# Patient Record
Sex: Female | Born: 1955 | Race: White | Hispanic: No | Smoking: Former smoker
Health system: Southern US, Community
[De-identification: ages and names within clinical notes are randomized; demographics above are authoritative.]

## PROBLEM LIST (undated history)

## (undated) DIAGNOSIS — F419 Anxiety disorder, unspecified: Secondary | ICD-10-CM

## (undated) DIAGNOSIS — O009 Unspecified ectopic pregnancy without intrauterine pregnancy: Secondary | ICD-10-CM

## (undated) DIAGNOSIS — R0600 Dyspnea, unspecified: Secondary | ICD-10-CM

## (undated) DIAGNOSIS — T7840XA Allergy, unspecified, initial encounter: Secondary | ICD-10-CM

## (undated) DIAGNOSIS — K219 Gastro-esophageal reflux disease without esophagitis: Secondary | ICD-10-CM

## (undated) DIAGNOSIS — J449 Chronic obstructive pulmonary disease, unspecified: Secondary | ICD-10-CM

## (undated) HISTORY — DX: Allergy, unspecified, initial encounter: T78.40XA

## (undated) HISTORY — PX: KIDNEY DONATION: SHX685

## (undated) HISTORY — DX: Chronic obstructive pulmonary disease, unspecified: J44.9

## (undated) HISTORY — PX: EYE SURGERY: SHX253

## (undated) HISTORY — PX: FRACTURE SURGERY: SHX138

## (undated) HISTORY — DX: Unspecified ectopic pregnancy without intrauterine pregnancy: O00.90

## (undated) HISTORY — DX: Anxiety disorder, unspecified: F41.9

## (undated) HISTORY — DX: Gastro-esophageal reflux disease without esophagitis: K21.9

## (undated) HISTORY — PX: BREAST EXCISIONAL BIOPSY: SUR124

## (undated) HISTORY — PX: HAND SURGERY: SHX662

## (undated) HISTORY — PX: TUBAL LIGATION: SHX77

## (undated) HISTORY — PX: BREAST SURGERY: SHX581

## (undated) HISTORY — DX: Dyspnea, unspecified: R06.00

---

## 2016-02-29 ENCOUNTER — Ambulatory Visit (HOSPITAL_COMMUNITY)
Admission: EM | Admit: 2016-02-29 | Discharge: 2016-02-29 | Disposition: A | Discharge status: Home / Self Care | Payer: 59 | Attending: Family Medicine | Admitting: Family Medicine

## 2016-02-29 ENCOUNTER — Encounter (HOSPITAL_COMMUNITY): Payer: Self-pay | Admitting: *Deleted

## 2016-02-29 DIAGNOSIS — R509 Fever, unspecified: Secondary | ICD-10-CM

## 2016-02-29 DIAGNOSIS — M791 Myalgia: Secondary | ICD-10-CM

## 2016-02-29 DIAGNOSIS — R69 Illness, unspecified: Secondary | ICD-10-CM | POA: Diagnosis not present

## 2016-02-29 DIAGNOSIS — R05 Cough: Secondary | ICD-10-CM

## 2016-02-29 DIAGNOSIS — J111 Influenza due to unidentified influenza virus with other respiratory manifestations: Secondary | ICD-10-CM

## 2016-02-29 MED ORDER — HYDROCODONE-HOMATROPINE 5-1.5 MG/5ML PO SYRP: 5.0000 mL | ORAL_SOLUTION | Freq: Four times a day (QID) | ORAL | 0 refills | Status: DC | PRN

## 2016-02-29 MED ORDER — OSELTAMIVIR PHOSPHATE 75 MG PO CAPS: 75.0000 mg | ORAL_CAPSULE | Freq: Two times a day (BID) | ORAL | 0 refills | Status: DC

## 2016-02-29 NOTE — ED Triage Notes (Signed)
Fever     Body   Aches      Chills        Non  Productive        Cough         Fell    On  Ice     yest     Landed  On l buttock     And  l  Elbow          Took  Some  Tylenol  This   Am

## 2016-02-29 NOTE — Discharge Instructions (Signed)
You should see improvement by Monday.  Stay out of work until Thursday.

## 2016-02-29 NOTE — ED Provider Notes (Signed)
[facility]    CSN: [id number] Arrival date & time: 02/29/16  [place]     History   Chief Complaint Chief Complaint  Patient presents with  . Fever    HPI Blaine Pasquarelli is a 61 y.o. female.   This a 61 year old woman who presents with fever, chills, and body aches. She works in [employer]. 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  She describes her cough as dry. She's had the symptoms for 2 days, beginning yesterday. She also fell yesterday and has some soreness from her fall on the ice.      History reviewed. No pertinent past medical history.  There are no active problems to display for this patient.   Past Surgical History:  Procedure Laterality Date  . KIDNEY DONATION      OB History    No data available       Home Medications    Prior to Admission medications   Medication Sig Start Date End Date Taking? Authorizing Provider  HYDROcodone-homatropine (HYDROMET) 5-1.5 MG/5ML syrup Take 5 mLs by mouth every 6 (six) hours as needed for cough. 02/29/16   Robyn Haber, MD  oseltamivir (TAMIFLU) 75 MG capsule Take 1 capsule (75 mg total) by mouth every 12 (twelve) hours. 02/29/16   Robyn Haber, MD    Family History History reviewed. No pertinent family history.  Social History Social History  Substance Use Topics  . Smoking status: Current Every Day Smoker  . Smokeless tobacco: Former Systems developer  . Alcohol use Yes     Allergies   Patient has no known allergies.   Review of Systems Review of Systems  Constitutional: Positive for chills, diaphoresis and fever.  Respiratory: Positive for cough.   Musculoskeletal: Positive for myalgias.     Physical Exam Triage Vital Signs ED Triage Vitals  Enc Vitals Group     BP 02/29/16 1458 130/72     Pulse Rate 02/29/16 1458 110     Resp 02/29/16 1458 18     Temp 02/29/16 1458 102.1 F (38.9 C)     Temp Source 02/29/16 1458 Oral     SpO2 02/29/16 1458 100 %     Weight --      Height --      Head Circumference --      Peak Flow --      Pain Score 02/29/16 1457 7     Pain Loc --      Pain Edu? --      Excl. in [place]? --    No data found.   Updated Vital Signs BP 130/72 (BP Location: Right Arm)   Pulse 110   Temp 102.1 F (38.9 C) (Oral)   Resp 18   LMP  (Approximate) Comment: cervical      ablation  SpO2 100%    Physical Exam  Constitutional: She is oriented to person, place, and time. She appears well-developed and well-nourished.  HENT:  Right Ear: External ear normal.  Left Ear: External ear normal.  Mouth/Throat: Oropharynx is clear and moist.  Eyes: Conjunctivae are normal.  Neck: Normal range of motion. Neck supple.  Cardiovascular: Normal rate, regular rhythm and normal heart sounds.   Pulmonary/Chest: Effort normal and breath sounds normal.  Musculoskeletal: Normal range of motion.  Neurological: She is alert and oriented to person, place, and time.  Skin: Skin is warm and dry.  Nursing note and vitals reviewed.    UC Treatments /  Results  Labs (all labs ordered are listed, but only abnormal results are displayed) Labs Reviewed - No data to display  EKG  EKG Interpretation None       Radiology No results found.  Procedures Procedures (including critical care time)  Medications Ordered in UC Medications - No data to display   Initial Impression / Assessment and Plan / UC Course  I have reviewed the triage vital signs and the nursing notes.  Pertinent labs & imaging results that were available during my care of the patient were reviewed by me and considered in my medical decision making (see chart for details).     Final Clinical Impressions(s) / UC Diagnoses   Final diagnoses:  Influenza-like illness    New Prescriptions New Prescriptions   HYDROCODONE-HOMATROPINE (HYDROMET) 5-1.5 MG/5ML SYRUP    Take 5 mLs by mouth every 6 (six) hours as needed for cough.   OSELTAMIVIR (TAMIFLU) 75 MG CAPSULE     Take 1 capsule (75 mg total) by mouth every 12 (twelve) hours.     Robyn Haber, MD 02/29/16 [phone]

## 2016-03-05 ENCOUNTER — Telehealth: Payer: Self-pay

## 2016-03-05 ENCOUNTER — Ambulatory Visit (INDEPENDENT_AMBULATORY_CARE_PROVIDER_SITE_OTHER): Payer: 59

## 2016-03-05 ENCOUNTER — Ambulatory Visit (INDEPENDENT_AMBULATORY_CARE_PROVIDER_SITE_OTHER): Payer: 59 | Admitting: Family Medicine

## 2016-03-05 VITALS — BP 118/64 | HR 82 | Temp 98.6°F | Resp 20 | Wt 169.4 lb

## 2016-03-05 DIAGNOSIS — J441 Chronic obstructive pulmonary disease with (acute) exacerbation: Secondary | ICD-10-CM

## 2016-03-05 DIAGNOSIS — R05 Cough: Secondary | ICD-10-CM

## 2016-03-05 DIAGNOSIS — R059 Cough, unspecified: Secondary | ICD-10-CM

## 2016-03-05 MED ORDER — PREDNISONE 20 MG PO TABS: 40.0000 mg | ORAL_TABLET | Freq: Every day | ORAL | 0 refills | Status: DC

## 2016-03-05 MED ORDER — ALBUTEROL SULFATE HFA 108 (90 BASE) MCG/ACT IN AERS: 2.0000 | INHALATION_SPRAY | RESPIRATORY_TRACT | 1 refills | Status: DC | PRN

## 2016-03-05 MED ORDER — HYDROCODONE-HOMATROPINE 5-1.5 MG/5ML PO SYRP: 5.0000 mL | ORAL_SOLUTION | Freq: Four times a day (QID) | ORAL | 0 refills | Status: DC | PRN

## 2016-03-05 MED FILL — HYDROCODONE-HOMATROPINE SYR: 5-1.5 | 9 days supply | Qty: 180 | Fill #0

## 2016-03-05 NOTE — Patient Instructions (Addendum)
Start prednisone 40 mg x 5 with breakfast.  I have refilled your cough syrup.   Start Albuterol 2 puffs every 4-6 hours as needed for wheezing or shortness of breath.  Return for care if symptoms of shortness or breath or wheezing worsens.    IF you received an x-ray today, you will receive an invoice from [facility]. Please contact [facility] at [phone] with questions or concerns regarding your invoice.   IF you received labwork today, you will receive an invoice from [facility]. Please contact [facility] at [phone] with questions or concerns regarding your invoice.   Our billing staff will not be able to assist you with questions regarding bills from these companies.  You will be contacted with the lab results as soon as they are available. The fastest way to get your results is to activate your My Chart account. Instructions are located on the last page of this paperwork. If you have not heard from [place] regarding the results in 2 weeks, please contact this office.     Chronic Obstructive Pulmonary Disease Exacerbation Chronic obstructive pulmonary disease (COPD) is a common lung condition in which airflow from the lungs is limited. COPD is a general term that can be used to describe many different lung problems that limit airflow, including chronic bronchitis and emphysema. COPD exacerbations are episodes when breathing symptoms become much worse and require extra treatment. Without treatment, COPD exacerbations can be life threatening, and frequent COPD exacerbations can cause further damage to your lungs. What are the causes?  Respiratory infections.  Exposure to smoke.  Exposure to air pollution, chemical fumes, or dust. Sometimes there is no apparent cause or trigger. What increases the risk?  Smoking cigarettes.  Older age.  Frequent prior COPD exacerbations. What are the signs or symptoms?  Increased coughing.  Increased thick spit (sputum)  production.  Increased wheezing.  Increased shortness of breath.  Rapid breathing.  Chest tightness. How is this diagnosed? Your medical history, a physical exam, and tests will help your health care provider make a diagnosis. Tests may include:  A chest X-ray.  Basic lab tests.  Sputum testing.  An arterial blood gas test. How is this treated? Depending on the severity of your COPD exacerbation, you may need to be admitted to a hospital for treatment. Some of the treatments commonly used to treat COPD exacerbations are:  Antibiotic medicines.  Bronchodilators. These are drugs that expand the air passages. They may be given with an inhaler or nebulizer. Spacer devices may be needed to help improve drug delivery.  Corticosteroid medicines.  Supplemental oxygen therapy.  Airway clearing techniques, such as noninvasive ventilation (NIV) and positive expiratory pressure (PEP). These provide respiratory support through a mask or other noninvasive device. Follow these instructions at home:  Do not smoke. Quitting smoking is very important to prevent COPD from getting worse and exacerbations from happening as often.  Avoid exposure to all substances that irritate the airway, especially to tobacco smoke.  If you were prescribed an antibiotic medicine, finish it all even if you start to feel better.  Take all medicines as directed by your health care provider.It is important to use correct technique with inhaled medicines.  Drink enough fluids to keep your urine clear or pale yellow (unless you have a medical condition that requires fluid restriction).  Use a cool mist vaporizer. This makes it easier to clear your chest when you cough.  If you have a home nebulizer and oxygen, continue to use  them as directed.  Maintain all necessary vaccinations to prevent infections.  Exercise regularly.  Eat a healthy diet.  Keep all follow-up appointments as directed by your health  care provider. Get help right away if:  You have worsening shortness of breath.  You have trouble talking.  You have severe chest pain.  You have blood in your sputum.  You have a fever.  You have weakness, vomit repeatedly, or faint.  You feel confused.  You continue to get worse. This information is not intended to replace advice given to you by your health care provider. Make sure you discuss any questions you have with your health care provider. Document Released: 11/23/2006 Document Revised: 07/04/2015 Document Reviewed: 09/30/2012 Elsevier Interactive Patient Education  2017 [employer].

## 2016-03-05 NOTE — Progress Notes (Signed)
Patient ID: Gabrielle Castro, female    DOB: April 13, 1955, 61 y.o.   MRN: [id number]  PCP: No PCP Per Patient  Chief Complaint  Patient presents with  . Cough    flu dx 4 days ago    Subjective:  HPI 61 year old female presents for evaluation of symptoms following dx of influenza x 5 days ago. Reports that she was seen and dx at [facility] 02/29/16. Completed Tamiflu yesterday.  Symptoms of wheezing, shortness of breath, and coughing persists. Feels sensation of "heaviness" in her chest. Cough is occasionally productive.  Cough syrup seemed to help with cough although she has almost taken all of medication. Reports daily fevers, highest slightly greater than 100. Although, she still feels ill, reports good food and fluid intake.  Social History   Social History  . Marital status: Married    Spouse name: N/A  . Number of children: N/A  . Years of education: N/A   Occupational History  . Not on file.   Social History Main Topics  . Smoking status: Current Every Day Smoker  . Smokeless tobacco: Former Systems developer  . Alcohol use Yes  . Drug use: Unknown  . Sexual activity: Not on file   Other Topics Concern  . Not on file   Social History Narrative  . No narrative on file    No family history on file.   Review of Systems  There are no active problems to display for this patient.   No Known Allergies  Prior to Admission medications   Medication Sig Start Date End Date Taking? Authorizing Provider  HYDROcodone-homatropine (HYDROMET) 5-1.5 MG/5ML syrup Take 5 mLs by mouth every 6 (six) hours as needed for cough. 02/29/16  Yes Robyn Haber, MD  oseltamivir (TAMIFLU) 75 MG capsule Take 1 capsule (75 mg total) by mouth every 12 (twelve) hours. Patient not taking: Reported on 03/05/2016 02/29/16   Robyn Haber, MD  Past Medical, Surgical Family and Social History reviewed and updated.   Objective:   Today's Vitals   03/05/16 1135  BP: 118/64  Pulse: 82    Resp: 20  Temp: 98.6 F (37 C)  TempSrc: Oral  SpO2: 93%  Weight: 169 lb 6.4 oz (76.8 kg)    Wt Readings from Last 3 Encounters:  03/05/16 169 lb 6.4 oz (76.8 kg)   Physical Exam  Constitutional: She is oriented to person, place, and time.  HENT:  Head: Normocephalic and atraumatic.  Right Ear: External ear normal.  Left Ear: External ear normal.  Mouth/Throat: Oropharynx is clear and moist.  Cardiovascular: Normal rate, regular rhythm, normal heart sounds and intact distal pulses.   Pulmonary/Chest: Effort normal.  Diminished air movement and expiratory wheeze  Musculoskeletal: Normal range of motion.  Neurological: She is alert and oriented to person, place, and time.  Skin: Skin is warm and dry.  Psychiatric: She has a normal mood and affect. Her behavior is normal. Judgment and thought content normal.   . Assessment & Plan:  1. Cough - DG Chest 2 View - Ambulatory referral to Pulmonology  2. COPD exacerbation ([place]) - Ambulatory referral to Pulmonology  Plan: Chest x-ray was negative for pneumonia  -Start Prednisone 40 mg x 5 days -Albuterol 2 puffs, every 4-6 hours for wheezing or shortness of breath  -Refilled hydrocodone-homatrapine 5 ml every 6 hours  Return for follow-up if symptoms do not improve or worsen.  Carroll Sage. Kenton Kingfisher, MSN, FNP-C [facility]  Group [phone]

## 2016-03-05 NOTE — Telephone Encounter (Signed)
Pt is at the [facility] out patient  [facility]armacy and she believes that her meds were called into the wrong pharmacy and needs to be called into [facility]

## 2016-03-06 MED ORDER — ALBUTEROL SULFATE HFA 108 (90 BASE) MCG/ACT IN AERS
2.0000 | INHALATION_SPRAY | RESPIRATORY_TRACT | 1 refills | Status: DC | PRN
Start: 2016-03-06 — End: 2017-06-02

## 2016-03-06 MED ORDER — PREDNISONE 20 MG PO TABS: 40.0000 mg | ORAL_TABLET | Freq: Every day | ORAL | 0 refills | Status: DC

## 2016-03-25 ENCOUNTER — Encounter: Payer: Self-pay | Admitting: Internal Medicine

## 2016-03-25 ENCOUNTER — Ambulatory Visit (INDEPENDENT_AMBULATORY_CARE_PROVIDER_SITE_OTHER): Payer: 59 | Admitting: Internal Medicine

## 2016-03-25 VITALS — BP 102/72 | Ht 62.5 in | Wt 173.0 lb

## 2016-03-25 DIAGNOSIS — J449 Chronic obstructive pulmonary disease, unspecified: Secondary | ICD-10-CM | POA: Diagnosis not present

## 2016-03-25 MED ORDER — GLYCOPYRROLATE-FORMOTEROL 9-4.8 MCG/ACT IN AERO: 2.0000 | INHALATION_SPRAY | Freq: Two times a day (BID) | RESPIRATORY_TRACT | 0 refills | Status: DC

## 2016-03-25 MED ORDER — GLYCOPYRROLATE-FORMOTEROL 9-4.8 MCG/ACT IN AERO: 2.0000 | INHALATION_SPRAY | Freq: Two times a day (BID) | RESPIRATORY_TRACT | 11 refills | Status: DC

## 2016-03-25 NOTE — Progress Notes (Signed)
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 Pulmonary Consult    Referred by Dr. Molli Barrows for eval of COPD. Pt states has occ SOB "when I run around or I get stressed".   breathing better since quit smoking = doe [facility] = can't walk a nl pace on a flat grade s sob but does fine slow and flat eg    No obvious day to day or daytime variability or assoc excess/ purulent sputum or mucus plugs or hemoptysis or cp or chest tightness, subjective wheeze or overt sinus or hb symptoms. No unusual exp hx or h/o childhood pna/ asthma or knowledge of premature birth.  Sleeping ok without nocturnal  or early am exacerbation  of respiratory  c/o's or need for noct saba. Also denies any obvious fluctuation of symptoms with weather or environmental changes or other aggravating or alleviating factors except as outlined above   Current Medications, Allergies, Complete Past Medical History, Past Surgical History, Family History, and Social History were reviewed in [employer] record.  ROS  The following are not active complaints unless bolded sore throat, dysphagia, dental problems, itching, sneezing,  nasal congestion or excess/ purulent secretions, ear ache,   fever, chills, sweats, unintended wt loss, classically pleuritic or exertional cp,  orthopnea pnd or leg swelling, presyncope, palpitations, abdominal pain, anorexia, nausea, vomiting,  diarrhea  or change in bowel or bladder habits, change in stools or urine, dysuria,hematuria,  rash, arthralgias, visual complaints, headache, numbness, weakness or ataxia or problems with walking or coordination,  change in mood/affect or memory.          Review of Systems     Objective:   Physical Exam  amb wf nad  Wt Readings from Last 3 Encounters:  03/25/16 173 lb (78.5 kg)  03/05/16 169 lb 6.4 oz (76.8 kg)    Vital signs reviewed - Note on arrival 02 sats  97% on RA     HEENT: nl dentition, turbinates bilaterally, and oropharynx. Nl external ear canals without cough reflex   NECK :  without JVD/Nodes/TM/ nl carotid upstrokes bilaterally   LUNGS: no acc muscle use,  slt barrel contour chest with distant bs, minimal bilateral late exp wheeze s cough on fvc    CV:  RRR  no s3 or murmur or increase in P2, and no edema   ABD:  soft and nontender with nl inspiratory excursion in the supine position. No bruits or organomegaly appreciated, bowel sounds nl  MS:  Nl gait/ ext warm without deformities, calf tenderness, cyanosis or clubbing No obvious joint restrictions   SKIN: warm and dry without lesions    NEURO:  alert, approp, nl sensorium with  no motor or cerebellar deficits apparent.       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 No acute abnormality seen.        Assessment:

## 2016-03-25 NOTE — Assessment & Plan Note (Addendum)
Quit smoking 02/2016  Spirometry 03/25/2016  FEV1 1.20 (49%)  Ratio 55 no saba for > 6h - 03/25/2016  After extensive coaching HFA effectiveness =    75% > try bevespi 2bid    Pt is Group B in terms of symptom/risk and laba/lama therefore appropriate rx at this point so rec trial of bevespi 2bid then return for full pfts on lama/laba and out at least 3 m from smoking for accurate staging     I reviewed the Fletcher curve with the patient that basically indicates  if you quit smoking when your best day FEV1 is still well preserved (as is relatively still   the case here)  it is highly unlikely you will progress to severe disease and informed the patient there was  no medication on the market that has proven to alter the curve/ its downward trajectory  or the likelihood of progression of their disease(unlike other chronic medical conditions such as atheroclerosis where we do think we can change the natural hx with risk reducing meds)    Therefore stopping smoking and maintaining abstinence is the most important aspect of care, not choice of inhalers or for that matter, doctors.     Total time devoted to counseling  > 50 % of initial 60 min office visit:  review case with pt/ discussion of options/alternatives/ personally creating written customized instructions  in presence of pt  then going over those specific  Instructions directly with the pt including how to use all of the meds but in particular covering each new medication in detail and the difference between the maintenance= "automatic" meds and the prns using an action plan format for the latter (If this problem/symptom => do that organization reading Left to right).  Please see AVS from this visit for a full list of these instructions which I personally wrote for this pt and  are unique to this visit.

## 2016-03-25 NOTE — Patient Instructions (Signed)
Plan A = Automatic = Bevespi Take 2 puffs first thing in am and then another 2 puffs about 12 hours later.   Work on inhaler technique:  relax and gently blow all the way out then take a nice smooth deep breath back in, triggering the inhaler at same time you start breathing in.  Hold for up to 5 seconds if you can. Blow out thru nose. Rinse and gargle with water when done   Plan B = Backup Only use your albuterol as a rescue medication to be used if you can't catch your breath by resting or doing a relaxed purse lip breathing pattern.  - The less you use it, the better it will work when you need it. - Ok to use the inhaler up to 2 puffs  every 4 hours if you must but call for appointment if use goes up over your usual need - Don't leave home without it !!  (think of it like the spare tire for your car)     Please schedule a follow up visit in 3 months but call sooner if needed with pfts on return

## 2016-03-31 ENCOUNTER — Encounter: Payer: Self-pay | Admitting: Internal Medicine

## 2016-03-31 NOTE — Telephone Encounter (Signed)
Pt requesting sprio results form 03-25-16 ov.  MW please advise. Thanks.

## 2016-04-06 ENCOUNTER — Telehealth: Payer: Self-pay | Admitting: Internal Medicine

## 2016-04-06 NOTE — Telephone Encounter (Signed)
Received fax from [name] is not covered and they prefer that she try Stiolto first  Per MW- we can give samples of Bevespi to last until her next appt here and then we can teach Stiolto  I spoke with the pt and she states she only took Bevespi once, and it kept her awake all night and therefore she does not want samples of this and states does not want to try another inhaler at this time  She will call if needed prior to next appt

## 2016-05-07 ENCOUNTER — Encounter: Payer: Self-pay | Admitting: Internal Medicine

## 2016-06-15 ENCOUNTER — Encounter: Payer: Self-pay | Admitting: Internal Medicine

## 2016-06-19 ENCOUNTER — Ambulatory Visit: Payer: 59 | Admitting: Physician Assistant

## 2016-06-26 ENCOUNTER — Ambulatory Visit: Payer: 59 | Admitting: Internal Medicine

## 2016-06-26 ENCOUNTER — Encounter: Payer: Self-pay | Admitting: Physician Assistant

## 2016-06-30 ENCOUNTER — Encounter: Payer: Self-pay | Admitting: Physician Assistant

## 2016-06-30 ENCOUNTER — Ambulatory Visit (INDEPENDENT_AMBULATORY_CARE_PROVIDER_SITE_OTHER): Payer: 59 | Admitting: Physician Assistant

## 2016-06-30 VITALS — BP 140/76 | HR 76 | Temp 98.5°F | Resp 18 | Ht 64.0 in | Wt 182.0 lb

## 2016-06-30 DIAGNOSIS — R635 Abnormal weight gain: Secondary | ICD-10-CM | POA: Diagnosis not present

## 2016-06-30 DIAGNOSIS — Z905 Acquired absence of kidney: Secondary | ICD-10-CM

## 2016-06-30 DIAGNOSIS — Z1159 Encounter for screening for other viral diseases: Secondary | ICD-10-CM | POA: Diagnosis not present

## 2016-06-30 DIAGNOSIS — Z87898 Personal history of other specified conditions: Secondary | ICD-10-CM

## 2016-06-30 DIAGNOSIS — Z114 Encounter for screening for human immunodeficiency virus [HIV]: Secondary | ICD-10-CM

## 2016-06-30 DIAGNOSIS — L659 Nonscarring hair loss, unspecified: Secondary | ICD-10-CM

## 2016-06-30 DIAGNOSIS — Z7689 Persons encountering health services in other specified circumstances: Secondary | ICD-10-CM

## 2016-06-30 DIAGNOSIS — J449 Chronic obstructive pulmonary disease, unspecified: Secondary | ICD-10-CM

## 2016-06-30 DIAGNOSIS — Z8742 Personal history of other diseases of the female genital tract: Secondary | ICD-10-CM | POA: Insufficient documentation

## 2016-06-30 LAB — POCT URINALYSIS DIP (MANUAL ENTRY)
BILIRUBIN UA: NEGATIVE
BILIRUBIN UA: NEGATIVE mg/dL
GLUCOSE UA: NEGATIVE mg/dL
Leukocytes, UA: NEGATIVE
Nitrite, UA: NEGATIVE
Protein Ur, POC: NEGATIVE mg/dL
RBC UA: NEGATIVE
SPEC GRAV UA: 1.02 (ref 1.010–1.025)
Urobilinogen, UA: 0.2 E.U./dL
pH, UA: 5.5 (ref 5.0–8.0)

## 2016-06-30 NOTE — Progress Notes (Signed)
Patient ID: Gabrielle Castro, female     DOB: Feb 16, 1955, 61 y.o.    MRN: [id number]  PCP: Patient, No Pcp Per  Chief Complaint  Patient presents with  . Establish Care    Subjective:   This patient is new to me and presents to establish care.  She was seen once in this office previously, by a colleague who has left our practice. Has seen Dr. Melvyn Novas x 1 visit, 03/25/2016. Follow-up recommended in 3 months. Visit 06/26/2016 was cancelled. She declined the follow-up and didn't take the prescribed Bevespi due to insomnia caused by the first dose.  Has gained > 20 lbs since quitting smoking 03/03/2016. Has joined a gym, in anticipation of weight gain associated with smoking cessation. Eliminated weight training, staying with cardio without change. Heaviest weight previously was 160 lbs, when pregnant.  Waking up in the middle of the night with sweaty palms, smelling like onions, hair falling out in clumps. Read online that this could represent a thyroid problem.  She started vitamin B complex vitamins QD. The onion scent has resolved, but she continues to gain weight. Some muscle aches for months. Very dry skin.  I don't like taking medications every day.  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 The recipient of her kidney died this past year.  Will be seeing OBGYN. History of pre-cancerous cells, s/p LEEP. Last colonoscopy at age 5. Was normal. Last mammogram was "long ago." Benign lump removed. Last tetanus vaccine unknown. Possibly when she came to [facility] about 18 months ago.   Review of Systems As above.   Prior to Admission medications   Medication Sig Start Date End Date Taking? Authorizing Provider  albuterol (PROVENTIL HFA;VENTOLIN HFA) 108 (90 Base) MCG/ACT inhaler Inhale 2 puffs into the lungs every 4 (four) hours as needed for wheezing or shortness of breath (cough, shortness of breath or wheezing.). 03/06/16  Yes  Scot Jun, FNP     No Known Allergies   Patient Active Problem List   Diagnosis Date Noted  . History of abnormal cervical Pap smear 06/30/2016  . COPD GOLD II/III  03/25/2016     Family History  Problem Relation Age of Onset  . Lung cancer Father        smoked  . Cancer Father        lung; +TOB  . Cancer Paternal Grandmother        "every kind of cancer"  . Heart disease Mother   . Diabetes Mother      Social History   Social History  . Marital status: Married    Spouse name: Marden Noble  . Number of children: N/A  . Years of education: N/A   Occupational History  . 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 Smoking status: Former Smoker    Packs/day: 1.00    Years: 41.00    Types: Cigarettes    Quit date: 03/03/2016  . Smokeless tobacco: Never Used  . Alcohol use Yes  . Drug use: Unknown  . Sexual activity: Not on file   Other Topics Concern  . Not on file   Social History Narrative   Lives with her husband, who is disabled due to multiple cardiovascular diagnoses, and her elderly mother.   Her mother is a "pack rat." Her husband doesn't like clutter, and makes snide comments about it.   One daughter lives neaby.   Other 2 daughters and  son live near [place], [place].          Objective:  Physical Exam  Constitutional: She is oriented to person, place, and time. She appears well-developed and well-nourished. She is active and cooperative. No distress.  BP 140/76   Pulse 76   Temp 98.5 F (36.9 C) (Oral)   Resp 18   Ht 5\' 4"  (1.626 m)   Wt 182 lb (82.6 kg)   SpO2 96%   BMI 31.24 kg/m   HENT:  Head: Normocephalic and atraumatic.  Right Ear: Hearing normal.  Left Ear: Hearing normal.  Eyes: Conjunctivae are normal. No scleral icterus.  Neck: Normal range of motion. Neck supple. No thyromegaly present.  Cardiovascular: Normal rate, regular rhythm and normal heart sounds.   Pulses:      Radial pulses are 2+ on the  right side, and 2+ on the left side.  Pulmonary/Chest: Effort normal and breath sounds normal.  Lymphadenopathy:       Head (right side): No tonsillar, no preauricular, no posterior auricular and no occipital adenopathy present.       Head (left side): No tonsillar, no preauricular, no posterior auricular and no occipital adenopathy present.    She has no cervical adenopathy.       Right: No supraclavicular adenopathy present.       Left: No supraclavicular adenopathy present.  Neurological: She is alert and oriented to person, place, and time. No sensory deficit.  Skin: Skin is warm, dry and intact. No rash noted. No cyanosis or erythema. Nails show no clubbing.  Psychiatric: She has a normal mood and affect. Her speech is normal and behavior is normal.    Wt Readings from Last 3 Encounters:  06/30/16 182 lb (82.6 kg)  03/25/16 173 lb (78.5 kg)  03/05/16 169 lb 6.4 oz (76.8 kg)       Assessment & Plan:   Problem List Items Addressed This Visit    COPD GOLD II/III     Remains a non-smoker. Continue PRN use of albuterol. If finds she needs it more regularly, would again encourage maintenance treatment.      History of abnormal cervical Pap smear    Will schedule visit for pap testing at her convenience.      Single kidney    Other Visit Diagnoses    Weight gain    -  Primary   Relevant Orders   Comprehensive metabolic panel (Completed)   CBC with Differential/Platelet (Completed)   TSH (Completed)   T4, free (Completed)   POCT urinalysis dipstick (Completed)   Lipid panel (Completed)   Hair loss       Relevant Orders   Comprehensive metabolic panel (Completed)   CBC with Differential/Platelet (Completed)   TSH (Completed)   T4, free (Completed)   Encounter to establish care       Need for hepatitis C screening test       Relevant Orders   Hepatitis C antibody (Completed)   Screening for HIV (human immunodeficiency virus)       Relevant Orders   HIV antibody  (Completed)       Return for re-evaluation depending on lab results.   Fara Chute, PA-C [facility]

## 2016-06-30 NOTE — Patient Instructions (Addendum)
We recommend that you schedule a mammogram for breast cancer screening. Typically, you do not need a referral to do this. Please contact a local imaging center to schedule your mammogram.  [facility] - [phone]  *ask for the Radiology Department [facility] - [phone] or [phone]  [facility] - [phone] [place] [phone] [facility] - [phone]  *ask for the [facility] - [phone]  *ask for the Radiology Department [facility] - [phone]  *ask for the [facility] - [phone]  Please call employee health and find out the date of your last tetanus vaccine. They can send you a list of all the vaccines you had to provide when you started work at [employer]. If you'll bring it to me, I'll get it entered into your record.   IF you received an x-ray today, you will receive an invoice from [facility]. Please contact [facility] at [phone] with questions or concerns regarding your invoice.   IF you received labwork today, you will receive an invoice from [facility]. Please contact [facility] at [phone] with questions or concerns regarding your invoice.   Our billing staff will not be able to assist you with questions regarding bills from these companies.  You will be contacted with the lab results as soon as they are available. The fastest way to get your results is to activate your My Chart account. Instructions are located on the last page of this paperwork. If you have not heard from [place] regarding the results in 2 weeks, please contact this office.

## 2016-07-01 LAB — CBC WITH DIFFERENTIAL/PLATELET
BASOS ABS: 0 10*3/uL (ref 0.0–0.2)
Basos: 1 %
EOS (ABSOLUTE): 0.3 10*3/uL (ref 0.0–0.4)
EOS: 5 %
HEMATOCRIT: 42 % (ref 34.0–46.6)
HEMOGLOBIN: 13.9 g/dL (ref 11.1–15.9)
IMMATURE GRANS (ABS): 0 10*3/uL (ref 0.0–0.1)
IMMATURE GRANULOCYTES: 0 %
LYMPHS: 42 %
Lymphocytes Absolute: 2.7 10*3/uL (ref 0.7–3.1)
MCH: 30.6 pg (ref 26.6–33.0)
MCHC: 33.1 g/dL (ref 31.5–35.7)
MCV: 93 fL (ref 79–97)
MONOCYTES: 7 %
Monocytes Absolute: 0.4 10*3/uL (ref 0.1–0.9)
NEUTROS PCT: 45 %
Neutrophils Absolute: 3 10*3/uL (ref 1.4–7.0)
Platelets: 275 10*3/uL (ref 150–379)
RBC: 4.54 x10E6/uL (ref 3.77–5.28)
RDW: 14.1 % (ref 12.3–15.4)
WBC: 6.6 10*3/uL (ref 3.4–10.8)

## 2016-07-01 LAB — T4, FREE: Free T4: 1.19 ng/dL (ref 0.82–1.77)

## 2016-07-01 LAB — COMPREHENSIVE METABOLIC PANEL
ALT: 19 IU/L (ref 0–32)
AST: 22 IU/L (ref 0–40)
Albumin/Globulin Ratio: 1.6 (ref 1.2–2.2)
Albumin: 4.4 g/dL (ref 3.5–5.5)
Alkaline Phosphatase: 73 IU/L (ref 39–117)
BUN/Creatinine Ratio: 17 (ref 9–23)
BUN: 19 mg/dL (ref 6–24)
CHLORIDE: 104 mmol/L (ref 96–106)
CO2: 23 mmol/L (ref 18–29)
CREATININE: 1.12 mg/dL — AB (ref 0.57–1.00)
Calcium: 9.5 mg/dL (ref 8.7–10.2)
GFR calc Af Amer: 62 mL/min/{1.73_m2} (ref >59)
GFR calc non Af Amer: 54 mL/min/{1.73_m2} — ABNORMAL LOW (ref >59)
GLUCOSE: 97 mg/dL (ref 65–99)
Globulin, Total: 2.7 g/dL (ref 1.5–4.5)
Potassium: 5 mmol/L (ref 3.5–5.2)
Sodium: 141 mmol/L (ref 134–144)
Total Protein: 7.1 g/dL (ref 6.0–8.5)

## 2016-07-01 LAB — TSH: TSH: 1.83 u[IU]/mL (ref 0.450–4.500)

## 2016-07-01 LAB — LIPID PANEL
CHOLESTEROL TOTAL: 241 mg/dL — AB (ref 100–199)
Chol/HDL Ratio: 2.8 ratio (ref 0.0–4.4)
HDL: 85 mg/dL (ref >39)
LDL CALC: 128 mg/dL — AB (ref 0–99)
TRIGLYCERIDES: 141 mg/dL (ref 0–149)
VLDL CHOLESTEROL CAL: 28 mg/dL (ref 5–40)

## 2016-07-01 LAB — HEPATITIS C ANTIBODY: Hep C Virus Ab: 0.2 s/co ratio (ref 0.0–0.9)

## 2016-07-01 LAB — HIV ANTIBODY (ROUTINE TESTING W REFLEX): HIV Screen 4th Generation wRfx: NONREACTIVE

## 2016-07-02 ENCOUNTER — Encounter: Payer: Self-pay | Admitting: Physician Assistant

## 2016-07-02 DIAGNOSIS — R7989 Other specified abnormal findings of blood chemistry: Secondary | ICD-10-CM | POA: Insufficient documentation

## 2016-07-02 LAB — VARICELLA ZOSTER ANTIBODY, IGM
VARICELLA ZOSTER AB IGM: 699.7
VZ IgM Titer Interp: POSITIVE

## 2016-07-02 LAB — RUBELLA SCREEN
RUBELLA: 8.31
Rubella IgM Interp: POSITIVE

## 2016-07-02 LAB — RUBEOLA ANTIBODY, IGM: Rubeola IgM: >300

## 2016-07-02 LAB — QUANTIFERON TB GOLD ASSAY (BLOOD): QUANTIFERON TB GOLD: NEGATIVE

## 2016-07-02 LAB — MUMPS ANTIBODY, IGM: Mumps IgM: 108

## 2016-07-03 DIAGNOSIS — Z905 Acquired absence of kidney: Secondary | ICD-10-CM | POA: Insufficient documentation

## 2016-07-03 NOTE — Assessment & Plan Note (Signed)
Will schedule visit for pap testing at her convenience.

## 2016-07-03 NOTE — Assessment & Plan Note (Signed)
Remains a non-smoker. Continue PRN use of albuterol. If finds she needs it more regularly, would again encourage maintenance treatment.

## 2016-08-07 ENCOUNTER — Encounter: Payer: Self-pay | Admitting: Physician Assistant

## 2016-08-07 ENCOUNTER — Ambulatory Visit (INDEPENDENT_AMBULATORY_CARE_PROVIDER_SITE_OTHER): Payer: 59

## 2016-08-07 ENCOUNTER — Ambulatory Visit (INDEPENDENT_AMBULATORY_CARE_PROVIDER_SITE_OTHER): Payer: 59 | Admitting: Physician Assistant

## 2016-08-07 VITALS — BP 136/90 | HR 79 | Temp 98.3°F | Resp 18 | Ht 64.0 in | Wt 188.4 lb

## 2016-08-07 DIAGNOSIS — Z6837 Body mass index (BMI) 37.0-37.9, adult: Secondary | ICD-10-CM

## 2016-08-07 DIAGNOSIS — M25511 Pain in right shoulder: Secondary | ICD-10-CM | POA: Diagnosis not present

## 2016-08-07 DIAGNOSIS — M79601 Pain in right arm: Secondary | ICD-10-CM

## 2016-08-07 DIAGNOSIS — M7581 Other shoulder lesions, right shoulder: Secondary | ICD-10-CM

## 2016-08-07 DIAGNOSIS — Z6832 Body mass index (BMI) 32.0-32.9, adult: Secondary | ICD-10-CM | POA: Insufficient documentation

## 2016-08-07 DIAGNOSIS — M778 Other enthesopathies, not elsewhere classified: Secondary | ICD-10-CM | POA: Insufficient documentation

## 2016-08-07 HISTORY — DX: Body mass index (BMI) 37.0-37.9, adult: Z68.37

## 2016-08-07 MED FILL — VENTOLIN HFA 90 MCG INHALER: 108 (90 BAS | 25 days supply | Qty: 18 | Fill #0

## 2016-08-07 NOTE — Progress Notes (Signed)
Subjective:    Patient ID: Gabrielle Castro, female    DOB: June 07, 1955, 61 y.o.   MRN: [id number]  HPI: 61 y/o F presents for right arm pain. Started this winter when putting on jacket and has progressively gotten worse. It hurts with movement. Lateral aspect of upper arm. Radiates down arm into hand, feels "achy and hot". Movement with shoulder aggravates it the most. Over head movement is the worst, feels sharp. When she closed her sun roof the other day it hurt so bad and she felt that her arm completely went numb like she lost control. Does not hurt when she pushes on it. Denies swelling, redness to the area. In November of 2017 she slipped on the ice and landed on her butt, doesn't remember hurting her arm at that time. Has tried tylenol, or an aspirin+acetaminophen combo for pain without relief. Has not tried ice or heat. Does not feel worse at any particular time of the day.   Whenever pregnant had carpal tunnel.   Patient Active Problem List   Diagnosis Date Noted  . BMI 32.0-32.9,adult 08/07/2016  . Arm pain, lateral, right 08/07/2016  . Single kidney 07/03/2016  . Elevated serum creatinine 07/02/2016  . History of abnormal cervical Pap smear 06/30/2016  . COPD GOLD II/III  03/25/2016   Past Medical History:  Diagnosis Date  . COPD (chronic obstructive pulmonary disease) ([place])   . EP (ectopic pregnancy)    Prior to Admission medications   Medication Sig Start Date End Date Taking? Authorizing Provider  albuterol (PROVENTIL HFA;VENTOLIN HFA) 108 (90 Base) MCG/ACT inhaler Inhale 2 puffs into the lungs every 4 (four) hours as needed for wheezing or shortness of breath (cough, shortness of breath or wheezing.). 03/06/16  Yes Scot Jun, FNP   No Known Allergies   Review of Systems  Constitutional: Negative.   HENT: Negative.   Eyes: Negative.   Respiratory: Negative.   Cardiovascular: Negative.   Gastrointestinal: Negative.   Endocrine: Negative.   Genitourinary:  Negative.   Musculoskeletal: Positive for myalgias. Negative for arthralgias, back pain, joint swelling (right lateral upper arm.), neck pain and neck stiffness.  Skin: Negative.   Allergic/Immunologic: Negative.   Neurological: Negative.   Hematological: Negative.   Psychiatric/Behavioral: Negative.        Objective:   Physical Exam  Constitutional: She appears well-developed and well-nourished. No distress.  BP 136/90 (BP Location: Left Arm, Patient Position: Sitting, Cuff Size: Normal)   Pulse 79   Temp 98.3 F (36.8 C) (Oral)   Resp 18   Ht 5\' 4"  (1.626 m)   Wt 188 lb 6.4 oz (85.5 kg)   SpO2 97%   BMI 32.34 kg/m    HENT:  Head: Normocephalic and atraumatic.  Eyes: Conjunctivae and EOM are normal. Pupils are equal, round, and reactive to light.  Neck: Normal range of motion. Neck supple.  Cardiovascular: Normal rate, regular rhythm, normal heart sounds, intact distal pulses and normal pulses.   Pulses:      Radial pulses are 2+ on the right side, and 2+ on the left side.  Musculoskeletal:  No Decreased ROM at elbow joint. No pain with Elbow flexion or extension . Strength is 5+ equal bilaterally.   No pain with forward flexion at shoulder. Mild pain with abduction at shoulder joint, with decreased strength against resistance. Moderate pain with putting hands behind her head. Severe pain with placing dorsal aspects of hands on low back.   Skin: She is not diaphoretic.  Dg Shoulder Right  Result Date: 08/07/2016 CLINICAL DATA:  Right shoulder pain with movement. EXAM: RIGHT SHOULDER - 2+ VIEW COMPARISON:  None. FINDINGS: There is no evidence of fracture or dislocation. There is no evidence of arthropathy or other focal bone abnormality. Soft tissues are unremarkable. IMPRESSION: Negative. Consider MRI if there is concern for rotator cuff abnormality. Electronically Signed   By: Margarette Canada M.D.   On: 08/07/2016 17:06   Dg Humerus Right  Result Date: 08/07/2016 CLINICAL  DATA:  61 year old female with right arm pain EXAM: RIGHT HUMERUS - 2+ VIEW COMPARISON:  None. FINDINGS: There is no evidence of fracture or other focal bone lesions. Soft tissues are unremarkable. IMPRESSION: Negative. Electronically Signed   By: Margarette Canada M.D.   On: 08/07/2016 17:04          Assessment & Plan:  1. Arm pain, lateral, right Worsening pain over last 5 months. Likely related to rotator cuff. R/u bony abnormality. Will plan according to X-ray results. Refer to physical therapy. Will order MRI if unable to tolerate PT or patient does not improve. - DG Humerus Right; Future - DG Shoulder Right; Future  Return if symptoms worsen or fail to improve.

## 2016-08-07 NOTE — Progress Notes (Signed)
Patient ID: Gabrielle Castro, female    DOB: 02-07-1956, 61 y.o.   MRN: [id number]  PCP: Patient, No Pcp Per  Chief Complaint  Patient presents with  . Arm Pain    Right arm pain, per pt pain has been happening for a while. Per pt it hurts to lift.    Subjective:   Presents for evaluation of RIGHT upper arm pain.  She is RIGHT hand dominant. Symptoms began over the winter-she recalls having pain when putting on and taking off her winter coat-and is progressively worsening. The pain is located on the outer portion of the upper arm, and can radiate into the forearm and hand. "Achy and hot." Aggravated by movement, especially over head activity. Several days ago she reached up to close the the sunroof in her car and had exquisite pain, sensation of numbness and loss of control of the arm.  No trauma or injury recalled, though she did have a fall 12/2015. She slipped on the ice and landed on her bottom. No recalled pain in the arm at that time or immediately following.  Acetaminophen without benefit. ASA without benefit. Has not tried ice or heat. No previous evaluation of this problem.  Recalls experiencing CTS while pregnant.    Review of Systems As above.     Patient Active Problem List   Diagnosis Date Noted  . BMI 32.0-32.9,adult 08/07/2016  . Arm pain, lateral, right 08/07/2016  . Single kidney 07/03/2016  . Elevated serum creatinine 07/02/2016  . History of abnormal cervical Pap smear 06/30/2016  . COPD GOLD II/III  03/25/2016     Prior to Admission medications   Medication Sig Start Date End Date Taking? Authorizing Provider  albuterol (PROVENTIL HFA;VENTOLIN HFA) 108 (90 Base) MCG/ACT inhaler Inhale 2 puffs into the lungs every 4 (four) hours as needed for wheezing or shortness of breath (cough, shortness of breath or wheezing.). 03/06/16  Yes Scot Jun, FNP     No Known Allergies     Objective:  Physical Exam  Constitutional: She is  oriented to person, place, and time. She appears well-developed and well-nourished. She is active and cooperative. No distress.  BP 136/90 (BP Location: Left Arm, Patient Position: Sitting, Cuff Size: Normal)   Pulse 79   Temp 98.3 F (36.8 C) (Oral)   Resp 18   Ht 5\' 4"  (1.626 m)   Wt 188 lb 6.4 oz (85.5 kg)   SpO2 97%   BMI 32.34 kg/m   HENT:  Head: Normocephalic and atraumatic.  Right Ear: Hearing normal.  Left Ear: Hearing normal.  Eyes: Conjunctivae are normal. No scleral icterus.  Neck: Normal range of motion. Neck supple. No thyromegaly present.  Cardiovascular: Normal rate, regular rhythm and normal heart sounds.   Pulses:      Radial pulses are 2+ on the right side, and 2+ on the left side.  Pulmonary/Chest: Effort normal and breath sounds normal.  Musculoskeletal:       Right shoulder: She exhibits decreased range of motion (due to pain ) and pain (with overhead activity). She exhibits no tenderness, no bony tenderness, no swelling, no effusion, no crepitus, no deformity, no laceration, no spasm, normal pulse and normal strength.       Right elbow: Normal.      Right wrist: Normal.       Cervical back: Normal.       Right upper arm: Normal.       Right forearm: Normal.  Right hand: Normal.  Lymphadenopathy:       Head (right side): No tonsillar, no preauricular, no posterior auricular and no occipital adenopathy present.       Head (left side): No tonsillar, no preauricular, no posterior auricular and no occipital adenopathy present.    She has no cervical adenopathy.       Right: No supraclavicular adenopathy present.       Left: No supraclavicular adenopathy present.  Neurological: She is alert and oriented to person, place, and time. She has normal strength. No sensory deficit.  Reflex Scores:      Bicep reflexes are 2+ on the right side and 2+ on the left side. Skin: Skin is warm, dry and intact. No rash noted. No cyanosis or erythema. Nails show no clubbing.    Psychiatric: She has a normal mood and affect. Her speech is normal and behavior is normal.       Assessment & Plan:   1. Arm pain, lateral, right Unclear etiology. Likely tendonitis, rotator cuff dysfunction. Avoid NSAIDS, due to single kidney. If radiographs are normal, plan PT. If worsening, would refer to orthopedics for additional evaluation, likely to include MRI. - DG Humerus Right; Future - DG Shoulder Right; Future    Return if symptoms worsen or fail to improve.   Fara Chute, PA-C [facility]   Dg Shoulder Right  Result Date: 08/07/2016 CLINICAL DATA:  Right shoulder pain with movement. EXAM: RIGHT SHOULDER - 2+ VIEW COMPARISON:  None. FINDINGS: There is no evidence of fracture or dislocation. There is no evidence of arthropathy or other focal bone abnormality. Soft tissues are unremarkable. IMPRESSION: Negative. Consider MRI if there is concern for rotator cuff abnormality. Electronically Signed   By: Margarette Canada M.D.   On: 08/07/2016 17:06   Dg Humerus Right  Result Date: 08/07/2016 CLINICAL DATA:  61 year old female with right arm pain EXAM: RIGHT HUMERUS - 2+ VIEW COMPARISON:  None. FINDINGS: There is no evidence of fracture or other focal bone lesions. Soft tissues are unremarkable. IMPRESSION: Negative. Electronically Signed   By: Margarette Canada M.D.   On: 08/07/2016 17:04

## 2016-08-07 NOTE — Patient Instructions (Addendum)
     IF you received an x-ray today, you will receive an invoice from [facility]. Please contact [facility] at [phone] with questions or concerns regarding your invoice.   IF you received labwork today, you will receive an invoice from [facility]. Please contact [facility] at [phone] with questions or concerns regarding your invoice.   Our billing staff will not be able to assist you with questions regarding bills from these companies.  You will be contacted with the lab results as soon as they are available. The fastest way to get your results is to activate your My Chart account. Instructions are located on the last page of this paperwork. If you have not heard from us regarding the results in 2 weeks, please contact this office.     

## 2016-08-10 ENCOUNTER — Encounter: Payer: Self-pay | Admitting: Physician Assistant

## 2016-08-14 DIAGNOSIS — Z1231 Encounter for screening mammogram for malignant neoplasm of breast: Secondary | ICD-10-CM | POA: Diagnosis not present

## 2016-08-14 DIAGNOSIS — Z01419 Encounter for gynecological examination (general) (routine) without abnormal findings: Secondary | ICD-10-CM | POA: Diagnosis not present

## 2016-08-14 DIAGNOSIS — Z124 Encounter for screening for malignant neoplasm of cervix: Secondary | ICD-10-CM | POA: Diagnosis not present

## 2016-08-14 LAB — HM PAP SMEAR

## 2016-08-14 MED FILL — FLUCONAZOLE 150 MG TABLET: 150 | 3 days supply | Qty: 2 | Fill #0

## 2016-08-19 ENCOUNTER — Encounter: Payer: Self-pay | Admitting: Physician Assistant

## 2016-08-19 DIAGNOSIS — M25511 Pain in right shoulder: Secondary | ICD-10-CM

## 2016-08-20 ENCOUNTER — Encounter: Payer: Self-pay | Admitting: Physician Assistant

## 2016-09-01 DIAGNOSIS — N939 Abnormal uterine and vaginal bleeding, unspecified: Secondary | ICD-10-CM | POA: Diagnosis not present

## 2016-09-07 ENCOUNTER — Ambulatory Visit
Admission: RE | Admit: 2016-09-07 | Discharge: 2016-09-07 | Disposition: A | Discharge status: Home / Self Care | Payer: 59 | Source: Ambulatory Visit | Attending: Physician Assistant | Admitting: Physician Assistant

## 2016-09-07 DIAGNOSIS — M25511 Pain in right shoulder: Secondary | ICD-10-CM | POA: Diagnosis not present

## 2016-09-07 DIAGNOSIS — S4991XA Unspecified injury of right shoulder and upper arm, initial encounter: Secondary | ICD-10-CM | POA: Diagnosis not present

## 2016-09-09 ENCOUNTER — Encounter: Payer: Self-pay | Admitting: Physician Assistant

## 2016-09-09 DIAGNOSIS — M778 Other enthesopathies, not elsewhere classified: Secondary | ICD-10-CM

## 2016-09-09 DIAGNOSIS — M7581 Other shoulder lesions, right shoulder: Principal | ICD-10-CM

## 2016-09-14 ENCOUNTER — Ambulatory Visit (INDEPENDENT_AMBULATORY_CARE_PROVIDER_SITE_OTHER): Payer: 59 | Admitting: Orthopaedic Surgery

## 2016-09-14 ENCOUNTER — Encounter (INDEPENDENT_AMBULATORY_CARE_PROVIDER_SITE_OTHER): Payer: Self-pay | Admitting: Orthopaedic Surgery

## 2016-09-14 DIAGNOSIS — M7501 Adhesive capsulitis of right shoulder: Secondary | ICD-10-CM | POA: Diagnosis not present

## 2016-09-14 NOTE — Progress Notes (Signed)
Office Visit Note   Patient: Gabrielle Castro           Date of Birth: 09/28/1955           MRN: [id number] Visit Date: 09/14/2016              Requested by: Harrison Mons, PA-C [address] [place], [place] [address] PCP: Harrison Mons, PA-C   Assessment & Plan: Visit Diagnoses:  1. Adhesive capsulitis of right shoulder     Plan: Overall impression is right shoulder adhesive capsulitis. Recommend cortisone injection and then initiation of physical therapy. I will like her to get a second cortisone injection 6 weeks from the first one. Continue with home exercises during that time. MRIs consistent with adhesive capsulitis. Follow-up as needed.  Follow-Up Instructions: Return if symptoms worsen or fail to improve.   Orders:  Orders Placed This Encounter  Procedures  . Ambulatory referral to Physical Medicine Rehab   No orders of the defined types were placed in this encounter.     Procedures: No procedures performed   Clinical Data: No additional findings.   Subjective: Chief Complaint  Patient presents with  . Right Shoulder - Pain    Patient is a 61 year old female comes in with right shoulder pain since January. She has noticed a worsening function and flexibility in her shoulder. She denies any trauma. Denies any numbness and tingling. Her arm and shoulder hurt constantly.    Review of Systems  Constitutional: Negative.   HENT: Negative.   Eyes: Negative.   Respiratory: Negative.   Cardiovascular: Negative.   Endocrine: Negative.   Musculoskeletal: Negative.   Neurological: Negative.   Hematological: Negative.   Psychiatric/Behavioral: Negative.   All other systems reviewed and are negative.    Objective: Vital Signs: There were no vitals taken for this visit.  Physical Exam  Constitutional: She is oriented to person, place, and time. She appears well-developed and well-nourished.  HENT:  Head: Normocephalic and atraumatic.  Eyes: EOM are  normal.  Neck: Neck supple.  Pulmonary/Chest: Effort normal.  Abdominal: Soft.  Neurological: She is alert and oriented to person, place, and time.  Skin: Skin is warm. Capillary refill takes less than 2 seconds.  Psychiatric: She has a normal mood and affect. Her behavior is normal. Judgment and thought content normal.  Nursing note and vitals reviewed.   Ortho Exam Right shoulder exam shows limitation in for flexion, external rotation, internal rotation compared to the contralateral side. No focal deficits otherwise. No signs of Spurling's. Specialty Comments:  No specialty comments available.  Imaging: No results found.   PMFS History: Patient Active Problem List   Diagnosis Date Noted  . Adhesive capsulitis of right shoulder 09/14/2016  . BMI 32.0-32.9,adult 08/07/2016  . Shoulder tendonitis, right 08/07/2016  . Single kidney 07/03/2016  . Elevated serum creatinine 07/02/2016  . History of abnormal cervical Pap smear 06/30/2016  . COPD GOLD II/III  03/25/2016   Past Medical History:  Diagnosis Date  . COPD (chronic obstructive pulmonary disease) ([place])   . EP (ectopic pregnancy)     Family History  Problem Relation Age of Onset  . Lung cancer Father        smoked  . Cancer Father        lung; +TOB  . Cancer Paternal Grandmother        "every kind of cancer"  . Heart disease Mother   . Diabetes Mother     Past Surgical History:  Procedure Laterality  Date  . BREAST SURGERY    . EYE SURGERY    . KIDNEY DONATION    . KIDNEY DONATION    . 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 Smoking status: Former Smoker    Packs/day: 1.00    Years: 41.00    Types: Cigarettes    Quit date: 03/03/2016  . Smokeless tobacco: Never Used  . Alcohol use Yes  . Drug use: Unknown  . Sexual activity: Not on file

## 2016-09-16 ENCOUNTER — Encounter: Payer: Self-pay | Admitting: Physician Assistant

## 2016-09-24 ENCOUNTER — Ambulatory Visit (INDEPENDENT_AMBULATORY_CARE_PROVIDER_SITE_OTHER): Payer: 59 | Admitting: Physical Medicine and Rehabilitation

## 2016-09-24 ENCOUNTER — Ambulatory Visit (INDEPENDENT_AMBULATORY_CARE_PROVIDER_SITE_OTHER): Payer: 59

## 2016-09-24 DIAGNOSIS — M25511 Pain in right shoulder: Secondary | ICD-10-CM

## 2016-09-24 DIAGNOSIS — G8929 Other chronic pain: Secondary | ICD-10-CM | POA: Diagnosis not present

## 2016-09-24 DIAGNOSIS — M7501 Adhesive capsulitis of right shoulder: Secondary | ICD-10-CM

## 2016-09-24 NOTE — Patient Instructions (Signed)

## 2016-09-24 NOTE — Progress Notes (Unsigned)
Fluoro time: 13 sec Mgy: 4.70

## 2016-09-24 NOTE — Progress Notes (Signed)
Gabrielle Castro - 61 y.o. female MRN [id number]  Date of birth: [birth year]  Office Visit Note: Visit Date: 09/24/2016 PCP: Harrison Mons, PA-C Referred by: Harrison Mons, PA-C  Subjective: Chief Complaint  Patient presents with  . Right Shoulder - Pain   HPI: Gabrielle Castro is a 61 year old female with right shoulder pain who recently saw Dr. Sherrian Divers in our office and was diagnosed with adhesive capsulitis of the right shoulder. He requests a diagnostic and therapeutic right glenohumeral joint arthrogram. She is continuing physical therapy.    ROS Otherwise per HPI.  Assessment & Plan: Visit Diagnoses:  1. Chronic right shoulder pain   2. Adhesive capsulitis of right shoulder     Plan: Findings:  Right glenohumeral joint anesthetic arthrogram. Patient did have increased range of motion in the anesthetic phase of the injection. She'll continue with physical therapy. He did want [place] to look at a six-week appointment for possible repeat injection. We did make that appointment today.    Meds & Orders: No orders of the defined types were placed in this encounter.   Orders Placed This Encounter  Procedures  . Large Joint Injection/Arthrocentesis  . XR C-ARM NO REPORT    Follow-up: Return in about 6 weeks (around 11/05/2016) for repeat injection.   Procedures: Large Joint Inj Date/Time: 09/24/2016 1:15 PM Performed by: Magnus Sinning Authorized by: Magnus Sinning   Consent Given by:  Patient Site marked: the procedure site was marked   Timeout: prior to procedure the correct patient, procedure, and site was verified   Indications:  Pain and diagnostic evaluation Location:  Shoulder Site:  R glenohumeral Prep: patient was prepped and draped in usual sterile fashion   Needle Size:  22 G Needle Length:  3.5 inches Approach:  Anteromedial Ultrasound Guidance: No   Fluoroscopic Guidance: No   Arthrogram: Yes   Medications:  80 mg triamcinolone acetonide 40 MG/ML; 3 mL  bupivacaine 0.5 % Aspiration Attempted: No   Patient tolerance:  Patient tolerated the procedure well with no immediate complications  Arthrogram demonstrated excellent flow of contrast throughout the joint surface without extravasation or obvious defect.  The patient had relief of symptoms during the anesthetic phase of the injection.     No notes on file   Clinical History: No specialty comments available.  She reports that she quit smoking about 6 months ago. Her smoking use included Cigarettes. She has a 41.00 pack-year smoking history. She has never used smokeless tobacco. No results for input(s): HGBA1C, LABURIC in the last 8760 hours.  Objective:  VS:  HT:    WT:   BMI:     BP:   HR: bpm  TEMP: ( )  RESP:  Physical Exam  Ortho Exam Imaging: No results found.  Past Medical/Family/Surgical/Social History: Medications & Allergies reviewed per EMR Patient Active Problem List   Diagnosis Date Noted  . Adhesive capsulitis of right shoulder 09/14/2016  . BMI 32.0-32.9,adult 08/07/2016  . Shoulder tendonitis, right 08/07/2016  . Single kidney 07/03/2016  . Elevated serum creatinine 07/02/2016  . History of abnormal cervical Pap smear 06/30/2016  . COPD GOLD II/III  03/25/2016   Past Medical History:  Diagnosis Date  . COPD (chronic obstructive pulmonary disease) ([place])   . EP (ectopic pregnancy)    Family History  Problem Relation Age of Onset  . Lung cancer Father        smoked  . Cancer Father        lung; +TOB  . Cancer  Paternal Grandmother        "every kind of cancer"  . Heart disease Mother   . Diabetes Mother    Past Surgical History:  Procedure Laterality Date  . BREAST SURGERY    . EYE SURGERY    . KIDNEY DONATION    . KIDNEY DONATION    . 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 Smoking status: Former Smoker    Packs/day: 1.00    Years: 41.00    Types:  Cigarettes    Quit date: 03/03/2016  . Smokeless tobacco: Never Used  . Alcohol use Yes  . Drug use: Unknown  . Sexual activity: Not on file

## 2016-09-24 NOTE — Progress Notes (Deleted)
Right shoulder pain since January. Gradually gotten worse. Pain down arm to hand. Arm goes numb with reaching behind.

## 2016-09-28 MED ORDER — TRIAMCINOLONE ACETONIDE 40 MG/ML IJ SUSP
80.0000 mg | INTRAMUSCULAR | Status: AC | PRN
  Administered 2016-09-24: 80 mg via INTRA_ARTICULAR

## 2016-09-28 MED ORDER — BUPIVACAINE HCL 0.5 % IJ SOLN
3.0000 mL | INTRAMUSCULAR | Status: AC | PRN
  Administered 2016-09-24: 3 mL via INTRA_ARTICULAR

## 2016-10-08 DIAGNOSIS — N950 Postmenopausal bleeding: Secondary | ICD-10-CM | POA: Diagnosis not present

## 2016-10-08 DIAGNOSIS — R8299 Other abnormal findings in urine: Secondary | ICD-10-CM | POA: Diagnosis not present

## 2016-10-14 ENCOUNTER — Encounter (INDEPENDENT_AMBULATORY_CARE_PROVIDER_SITE_OTHER): Payer: Self-pay | Admitting: Physical Medicine and Rehabilitation

## 2016-10-23 ENCOUNTER — Encounter: Payer: Self-pay | Admitting: Physician Assistant

## 2016-10-26 ENCOUNTER — Encounter (INDEPENDENT_AMBULATORY_CARE_PROVIDER_SITE_OTHER): Payer: Self-pay | Admitting: Physical Medicine and Rehabilitation

## 2016-10-26 ENCOUNTER — Encounter (INDEPENDENT_AMBULATORY_CARE_PROVIDER_SITE_OTHER): Payer: Self-pay

## 2016-10-26 ENCOUNTER — Ambulatory Visit (INDEPENDENT_AMBULATORY_CARE_PROVIDER_SITE_OTHER): Payer: 59 | Admitting: Physical Medicine and Rehabilitation

## 2016-10-26 ENCOUNTER — Ambulatory Visit (INDEPENDENT_AMBULATORY_CARE_PROVIDER_SITE_OTHER): Payer: Self-pay

## 2016-10-26 DIAGNOSIS — M7501 Adhesive capsulitis of right shoulder: Secondary | ICD-10-CM | POA: Diagnosis not present

## 2016-10-26 NOTE — Progress Notes (Deleted)
Patient states she did really well with the last injection.Pain free for a few days after. Has Increased range of motion.

## 2016-10-26 NOTE — Progress Notes (Deleted)
   Gabrielle Castro - 61 y.o. female MRN [id number]  Date of birth: [birth year]  Office Visit Note: Visit Date: 10/26/2016 PCP: Harrison Mons, PA-C Referred by: Harrison Mons, PA-C  Subjective: Chief Complaint  Patient presents with  . Right Shoulder - Pain   HPI: HPI  ROS Otherwise per HPI.  Assessment & Plan: Visit Diagnoses:  1. Chronic right shoulder pain   2. Adhesive capsulitis of right shoulder     Plan: No additional findings.   Meds & Orders: No orders of the defined types were placed in this encounter.  No orders of the defined types were placed in this encounter.   Follow-up: No Follow-up on file.   Procedures: No procedures performed  No notes on file   Clinical History: No specialty comments available.  She reports that she quit smoking about 7 months ago. Her smoking use included Cigarettes. She has a 41.00 pack-year smoking history. She has never used smokeless tobacco. No results for input(s): HGBA1C, LABURIC in the last 8760 hours.  Objective:  VS:  HT:    WT:   BMI:     BP:   HR: bpm  TEMP: ( )  RESP:  Physical Exam  Ortho Exam Imaging: No results found.  Past Medical/Family/Surgical/Social History: Medications & Allergies reviewed per EMR Patient Active Problem List   Diagnosis Date Noted  . Adhesive capsulitis of right shoulder 09/14/2016  . BMI 32.0-32.9,adult 08/07/2016  . Shoulder tendonitis, right 08/07/2016  . Single kidney 07/03/2016  . Elevated serum creatinine 07/02/2016  . History of abnormal cervical Pap smear 06/30/2016  . COPD GOLD II/III  03/25/2016   Past Medical History:  Diagnosis Date  . COPD (chronic obstructive pulmonary disease) ([place])   . EP (ectopic pregnancy)    Family History  Problem Relation Age of Onset  . Lung cancer Father        smoked  . Cancer Father        lung; +TOB  . Cancer Paternal Grandmother        "every kind of cancer"  . Heart disease Mother   . Diabetes Mother    Past Surgical  History:  Procedure Laterality Date  . BREAST SURGERY    . EYE SURGERY    . KIDNEY DONATION    . KIDNEY DONATION    . 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 Smoking status: Former Smoker    Packs/day: 1.00    Years: 41.00    Types: Cigarettes    Quit date: 03/03/2016  . Smokeless tobacco: Never Used  . Alcohol use Yes  . Drug use: Unknown  . Sexual activity: Not on file

## 2016-11-05 ENCOUNTER — Encounter (INDEPENDENT_AMBULATORY_CARE_PROVIDER_SITE_OTHER): Payer: Self-pay | Admitting: Physical Medicine and Rehabilitation

## 2016-11-05 NOTE — Progress Notes (Signed)
Gabrielle Castro - 61 y.o. female MRN [id number]  Date of birth: September 09, 1955  Office Visit Note: Visit Date: 10/26/2016 PCP: Harrison Mons, PA-C Referred by: Harrison Mons, PA-C  Subjective: Chief Complaint  Patient presents with  . Right Shoulder - Pain   HPI: Gabrielle Castro is a 61 year old female with history of chronic right shoulder pain and adhesive capsulitis of the right shoulder. We completed a right glenohumeral joint anesthetic arthrogram with really good relief of her symptoms during the diagnostic phase as well as on after that she had increased range of motion and essentially pain-free for a little while that with increased range of motion with physical therapy. She really reports today no pain in a lot of increased range of motion and therapy and is pretty happy with the results. She came in today for planned possible injection of the shoulder. She didn't quite understand the fact that to the injection really is to get someone over the hop of the pain process of the they can do the therapy and increase the range of motion which she has done. She denies any new trauma or focal weakness. She's had no radicular complaints of paresthesias. She reports significant increased range of motion on the left shoulder doing quite well overall. She has some pain at end ranges but this is something she is still working on. Her at rest.    Review of Systems  All other systems reviewed and are negative.  Otherwise per HPI.  Assessment & Plan: Visit Diagnoses:  1. Adhesive capsulitis of right shoulder     Plan: Findings:  Significant increased range of motion of the left shoulder status post glenohumeral joint injection and physical therapy. She'll continue follow-up with Dr. Wyline Copas for her orthopedic care. If her symptoms were to return we could repeat the injection.    Meds & Orders: No orders of the defined types were placed in this encounter.  No orders of the defined types were placed in  this encounter.   Follow-up: Return if symptoms worsen or fail to improve, for Dr. Erlinda Hong.   Procedures: No procedures performed  No notes on file   Clinical History: No specialty comments available.  She reports that she quit smoking about 8 months ago. Her smoking use included Cigarettes. She has a 41.00 pack-year smoking history. She has never used smokeless tobacco. No results for input(s): HGBA1C, LABURIC in the last 8760 hours.  Objective:  VS:  HT:    WT:   BMI:     BP:   HR: bpm  TEMP: ( )  RESP:  Physical Exam  Musculoskeletal:  Examination of the left shoulder shows almost full range of motion with some limitations at end ranges of abduction and extension. External rotation show some pain at end range. Significant increased range of motion from prior exam.    Ortho Exam Imaging: No results found.  Past Medical/Family/Surgical/Social History: Medications & Allergies reviewed per EMR Patient Active Problem List   Diagnosis Date Noted  . Adhesive capsulitis of right shoulder 09/14/2016  . BMI 32.0-32.9,adult 08/07/2016  . Shoulder tendonitis, right 08/07/2016  . Single kidney 07/03/2016  . Elevated serum creatinine 07/02/2016  . History of abnormal cervical Pap smear 06/30/2016  . COPD GOLD II/III  03/25/2016   Past Medical History:  Diagnosis Date  . COPD (chronic obstructive pulmonary disease) ([place])   . EP (ectopic pregnancy)    Family History  Problem Relation Age of Onset  . Lung cancer Father  smoked  . Cancer Father        lung; +TOB  . Cancer Paternal Grandmother        "every kind of cancer"  . Heart disease Mother   . Diabetes Mother    Past Surgical History:  Procedure Laterality Date  . BREAST SURGERY    . EYE SURGERY    . KIDNEY DONATION    . KIDNEY DONATION    . 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 Smoking status: Former Smoker    Packs/day:  1.00    Years: 41.00    Types: Cigarettes    Quit date: 03/03/2016  . Smokeless tobacco: Never Used  . Alcohol use Yes  . Drug use: Unknown  . Sexual activity: Not on file

## 2016-11-06 ENCOUNTER — Ambulatory Visit (INDEPENDENT_AMBULATORY_CARE_PROVIDER_SITE_OTHER): Payer: 59 | Admitting: Physical Medicine and Rehabilitation

## 2016-11-26 ENCOUNTER — Encounter: Payer: Self-pay | Admitting: Physician Assistant

## 2016-12-02 MED FILL — VENTOLIN HFA 90 MCG INHALER: 108 (90 BAS | 25 days supply | Qty: 18 | Fill #1

## 2017-01-19 ENCOUNTER — Telehealth: Payer: 59 | Admitting: Family

## 2017-01-19 ENCOUNTER — Encounter: Payer: Self-pay | Admitting: Physician Assistant

## 2017-01-19 DIAGNOSIS — S39012A Strain of muscle, fascia and tendon of lower back, initial encounter: Secondary | ICD-10-CM

## 2017-01-19 MED ORDER — NAPROXEN 500 MG PO TABS: 500.0000 mg | ORAL_TABLET | Freq: Two times a day (BID) | ORAL | 0 refills | Status: DC

## 2017-01-19 MED ORDER — CYCLOBENZAPRINE HCL 10 MG PO TABS: 10.0000 mg | ORAL_TABLET | Freq: Three times a day (TID) | ORAL | 0 refills | Status: DC | PRN

## 2017-01-19 MED FILL — CYCLOBENZAPRINE 10 MG TAB: 10 | 10 days supply | Qty: 30 | Fill #0

## 2017-01-19 NOTE — Progress Notes (Signed)

## 2017-02-05 ENCOUNTER — Encounter: Payer: Self-pay | Admitting: Physician Assistant

## 2017-02-05 ENCOUNTER — Other Ambulatory Visit: Payer: Self-pay

## 2017-02-05 ENCOUNTER — Ambulatory Visit (INDEPENDENT_AMBULATORY_CARE_PROVIDER_SITE_OTHER): Payer: 59 | Admitting: Physician Assistant

## 2017-02-05 VITALS — BP 112/78 | HR 100 | Temp 100.3°F | Resp 16 | Ht 64.0 in | Wt 194.0 lb

## 2017-02-05 DIAGNOSIS — J101 Influenza due to other identified influenza virus with other respiratory manifestations: Secondary | ICD-10-CM | POA: Diagnosis not present

## 2017-02-05 DIAGNOSIS — R070 Pain in throat: Secondary | ICD-10-CM | POA: Diagnosis not present

## 2017-02-05 DIAGNOSIS — J020 Streptococcal pharyngitis: Secondary | ICD-10-CM | POA: Diagnosis not present

## 2017-02-05 DIAGNOSIS — R52 Pain, unspecified: Secondary | ICD-10-CM

## 2017-02-05 LAB — POCT INFLUENZA A/B
INFLUENZA A, POC: POSITIVE — AB
INFLUENZA B, POC: NEGATIVE

## 2017-02-05 LAB — POCT RAPID STREP A (OFFICE): Rapid Strep A Screen: NEGATIVE

## 2017-02-05 MED ORDER — OSELTAMIVIR PHOSPHATE 75 MG PO CAPS: 75.0000 mg | ORAL_CAPSULE | Freq: Two times a day (BID) | ORAL | 0 refills | Status: DC

## 2017-02-05 MED FILL — OSELTAMIVIR PHOS 75 MG CAP: 75 | 5 days supply | Qty: 10 | Fill #0

## 2017-02-05 NOTE — Patient Instructions (Addendum)
Please make sure you are hydrating well with water, 64 oz. Take tylenol for pain and fever.  This can be 500mg  every 6 hours as needed, for example.  Influenza, Adult Influenza ("the flu") is an infection in the lungs, nose, and throat (respiratory tract). It is caused by a virus. The flu causes many common cold symptoms, as well as a high fever and body aches. It can make you feel very sick. The flu spreads easily from person to person (is contagious). Getting a flu shot (influenza vaccination) every year is the best way to prevent the flu. Follow these instructions at home:  Take over-the-counter and prescription medicines only as told by your doctor.  Use a cool mist humidifier to add moisture (humidity) to the air in your home. This can make it easier to breathe.  Rest as needed.  Drink enough fluid to keep your pee (urine) clear or pale yellow.  Cover your mouth and nose when you cough or sneeze.  Wash your hands with soap and water often, especially after you cough or sneeze. If you cannot use soap and water, use hand sanitizer.  Stay home from work or school as told by your doctor. Unless you are visiting your doctor, try to avoid leaving home until your fever has been gone for 24 hours without the use of medicine.  Keep all follow-up visits as told by your doctor. This is important. How is this prevented?  Getting a yearly (annual) flu shot is the best way to avoid getting the flu. You may get the flu shot in late summer, fall, or winter. Ask your doctor when you should get your flu shot.  Wash your hands often or use hand sanitizer often.  Avoid contact with people who are sick during cold and flu season.  Eat healthy foods.  Drink plenty of fluids.  Get enough sleep.  Exercise regularly. Contact a doctor if:  You get new symptoms.  You have: ? Chest pain. ? Watery poop (diarrhea). ? A fever.  Your cough gets worse.  You start to have more mucus.  You feel  sick to your stomach (nauseous).  You throw up (vomit). Get help right away if:  You start to be short of breath or have trouble breathing.  Your skin or nails turn a bluish color.  You have very bad pain or stiffness in your neck.  You get a sudden headache.  You get sudden pain in your face or ear.  You cannot stop throwing up. This information is not intended to replace advice given to you by your health care provider. Make sure you discuss any questions you have with your health care provider. Document Released: 11/05/2007 Document Revised: 07/04/2015 Document Reviewed: 11/20/2014 Elsevier Interactive Patient Education  2017 [employer].     IF you received an x-ray today, you will receive an invoice from [facility]. Please contact [facility] at [phone] with questions or concerns regarding your invoice.   IF you received labwork today, you will receive an invoice from [facility]. Please contact [facility] at [phone] with questions or concerns regarding your invoice.   Our billing staff will not be able to assist you with questions regarding bills from these companies.  You will be contacted with the lab results as soon as they are available. The fastest way to get your results is to activate your My Chart account. Instructions are located on the last page of this paperwork. If you have not heard from [place]  regarding the results in 2 weeks, please contact this office.

## 2017-02-05 NOTE — Progress Notes (Signed)
PRIMARY CARE AT [facility] [address] [place] [address] [phone]  Date:  02/05/2017   Name:  Gabrielle Castro   DOB:  05/02/1955   MRN:  [id number]  PCP:  Harrison Mons, PA-C    History of Present Illness:  Gabrielle Castro is a 61 y.o. female patient who presents to PCP with  Chief Complaint  Patient presents with  . Fever    100.3  . Generalized Body Aches    x 2 days/ pt was exposed to the flu  . Sore Throat    x 2 days  . Sinusitis    congestion     Onset of symptoms within the last 2 days... Feels like razor blades in her throat. Generalized body aches. She took 2 tylenol at night.  And some ibuprofen.  No sob or dyspnea.  Cough is minimal.  She notes a sick contact from relative who is in the hospital.  This was parainfluenza found of the patient.  She is concerned that this may affect her.    Patient Active Problem List   Diagnosis Date Noted  . Adhesive capsulitis of right shoulder 09/14/2016  . BMI 32.0-32.9,adult 08/07/2016  . Shoulder tendonitis, right 08/07/2016  . Single kidney 07/03/2016  . Elevated serum creatinine 07/02/2016  . History of abnormal cervical Pap smear 06/30/2016  . COPD GOLD II/III  03/25/2016    Past Medical History:  Diagnosis Date  . COPD (chronic obstructive pulmonary disease) ([place])   . EP (ectopic pregnancy)     Past Surgical History:  Procedure Laterality Date  . BREAST SURGERY    . EYE SURGERY    . KIDNEY DONATION    . KIDNEY DONATION    . TUBAL LIGATION      Social History   Tobacco Use  . Smoking status: Former Smoker    Packs/day: 1.00    Years: 41.00    Pack years: 41.00    Types: Cigarettes    Last attempt to quit: 03/03/2016    Years since quitting: 0.9  . Smokeless tobacco: Never Used  Substance Use Topics  . Alcohol use: Yes  . Drug use: Not on file    Family History  Problem Relation Age of Onset  . Lung cancer Father        smoked  . Cancer Father        lung; +TOB  . Cancer Paternal  Grandmother        "every kind of cancer"  . Heart disease Mother   . Diabetes Mother     No Known Allergies  Medication list has been reviewed and updated.  Current Outpatient Medications on File Prior to Visit  Medication Sig Dispense Refill  . albuterol (PROVENTIL HFA;VENTOLIN HFA) 108 (90 Base) MCG/ACT inhaler Inhale 2 puffs into the lungs every 4 (four) hours as needed for wheezing or shortness of breath (cough, shortness of breath or wheezing.). 1 Inhaler 1  . cyclobenzaprine (FLEXERIL) 10 MG tablet Take 1 tablet (10 mg total) by mouth 3 (three) times daily as needed for muscle spasms. 30 tablet 0   No current facility-administered medications on file prior to visit.     ROS ROS otherwise unremarkable unless listed above.  Physical Examination: BP 112/78   Pulse 100   Temp 100.3 F (37.9 C) (Oral)   Resp 16   Ht 5\' 4"  (1.626 m)   Wt 194 lb (88 kg)   SpO2 96%   BMI 33.30 kg/m  Ideal Body  Weight: Weight in (lb) to have BMI = 25: 145.3  Physical Exam  Constitutional: She is oriented to person, place, and time. She appears well-developed and well-nourished. No distress.  HENT:  Head: Normocephalic and atraumatic.  Right Ear: Tympanic membrane, external ear and ear canal normal.  Left Ear: Tympanic membrane, external ear and ear canal normal.  Nose: Mucosal edema and rhinorrhea present. Right sinus exhibits no maxillary sinus tenderness and no frontal sinus tenderness. Left sinus exhibits no maxillary sinus tenderness and no frontal sinus tenderness.  Mouth/Throat: No uvula swelling. Posterior oropharyngeal erythema (very mildly) present. No oropharyngeal exudate or posterior oropharyngeal edema.  Eyes: Conjunctivae and EOM are normal. Pupils are equal, round, and reactive to light.  Cardiovascular: Normal rate and regular rhythm. Exam reveals no gallop, no distant heart sounds and no friction rub.  No murmur heard. Pulmonary/Chest: Effort normal. No respiratory  distress. She has no decreased breath sounds. She has no wheezes. She has no rhonchi.  Lymphadenopathy:       Head (right side): No submandibular, no tonsillar, no preauricular and no posterior auricular adenopathy present.       Head (left side): No submandibular, no tonsillar, no preauricular and no posterior auricular adenopathy present.  Neurological: She is alert and oriented to person, place, and time.  Skin: She is not diaphoretic.  Psychiatric: She has a normal mood and affect. Her behavior is normal.     Assessment and Plan: Gabrielle Castro is a 61 y.o. female who is here today for cc of  Chief Complaint  Patient presents with  . Fever    100.3  . Generalized Body Aches    x 2 days/ pt was exposed to the flu  . Sore Throat    x 2 days  . Sinusitis    congestion  treat with tamiflu at this time.  Kidney function  Appears tolerable to treatment from recent lab findings.   Follow up as needed. Influenza A - Plan: POCT rapid strep A, Culture, Group A Strep, CANCELED: Viral Cult, Rapid, Respiratory  Body aches - Plan: POCT Influenza A/B, Culture, Group A Strep  Throat pain - Plan: Culture, Group A Strep  Ivar Drape, PA-C [facility] 12/30/20184:23 PM

## 2017-02-07 ENCOUNTER — Encounter: Payer: Self-pay | Admitting: Physician Assistant

## 2017-02-08 LAB — CULTURE, GROUP A STREP: STREP A CULTURE: NEGATIVE

## 2017-02-17 ENCOUNTER — Encounter: Payer: Self-pay | Admitting: Physician Assistant

## 2017-02-19 ENCOUNTER — Other Ambulatory Visit: Payer: Self-pay

## 2017-02-19 MED ORDER — BENZONATATE 100 MG PO CAPS: 100.0000 mg | ORAL_CAPSULE | Freq: Three times a day (TID) | ORAL | 0 refills | Status: DC | PRN

## 2017-02-19 MED FILL — BENZONATATE 100 MG CAPS: 100 | 7 days supply | Qty: 40 | Fill #0

## 2017-04-29 ENCOUNTER — Ambulatory Visit (INDEPENDENT_AMBULATORY_CARE_PROVIDER_SITE_OTHER): Payer: 59 | Admitting: Physician Assistant

## 2017-04-29 ENCOUNTER — Encounter: Payer: Self-pay | Admitting: Physician Assistant

## 2017-04-29 ENCOUNTER — Other Ambulatory Visit: Payer: Self-pay

## 2017-04-29 VITALS — BP 110/82 | HR 81 | Temp 98.0°F | Resp 16 | Ht 64.0 in | Wt 192.4 lb

## 2017-04-29 DIAGNOSIS — Z8742 Personal history of other diseases of the female genital tract: Secondary | ICD-10-CM

## 2017-04-29 DIAGNOSIS — Z87898 Personal history of other specified conditions: Secondary | ICD-10-CM

## 2017-04-29 DIAGNOSIS — Z13220 Encounter for screening for lipoid disorders: Secondary | ICD-10-CM

## 2017-04-29 DIAGNOSIS — J449 Chronic obstructive pulmonary disease, unspecified: Secondary | ICD-10-CM

## 2017-04-29 DIAGNOSIS — Z0000 Encounter for general adult medical examination without abnormal findings: Secondary | ICD-10-CM

## 2017-04-29 DIAGNOSIS — R7989 Other specified abnormal findings of blood chemistry: Secondary | ICD-10-CM

## 2017-04-29 DIAGNOSIS — Z1211 Encounter for screening for malignant neoplasm of colon: Secondary | ICD-10-CM | POA: Diagnosis not present

## 2017-04-29 DIAGNOSIS — Z6832 Body mass index (BMI) 32.0-32.9, adult: Secondary | ICD-10-CM

## 2017-04-29 DIAGNOSIS — Z1329 Encounter for screening for other suspected endocrine disorder: Secondary | ICD-10-CM

## 2017-04-29 MED ORDER — BECLOMETHASONE DIPROP HFA 40 MCG/ACT IN AERB: 2.0000 | INHALATION_SPRAY | Freq: Two times a day (BID) | RESPIRATORY_TRACT | 12 refills | Status: DC

## 2017-04-29 MED FILL — QVAR REDIHALER 40 MCG/ACT A: 40 | 30 days supply | Qty: 11 | Fill #0

## 2017-04-29 NOTE — Progress Notes (Signed)
Patient ID: Gabrielle Castro, female    DOB: [birth year], 62 y.o.   MRN: [id number]  PCP: Harrison Mons, PA-C  Chief Complaint  Patient presents with  . Annual Exam    Subjective:   Presents for [employer].  She has COPD, single kidney and elevated creatinine.  Has not seen pulmonology in years, and does not plan to return. Only used maintenance product temporarily, due to cost and insomnia. Uses the albuterol for rescue and pre-exercise. Gets out of breath with exertion, but doesn't usually use the inhaler. No associated CP.  Cervical Cancer Screening: 08/14/2016 with Dr. Philis Pique, history of "precancerous cells" Breast Cancer Screening: 08/14/2016 Colorectal Cancer Screening: 02/10/07 Bone Density Testing: not yet a candidate HIV Screening: 06/30/16 STI Screening: not currently sexually active since her husband's stroke in 2014 Seasonal Influenza Vaccination: 11/25/16 Td/Tdap Vaccination: 03/28/14 Pneumococcal Vaccination: not yet a candidate Zoster Vaccination: not yet Frequency of Dental evaluation: "I don't," not since her 20's, due to fear Frequency of Eye evaluation: due for this   Review of Systems  Constitutional: Negative.   HENT: Negative.   Eyes: Negative.   Respiratory: Positive for shortness of breath. Negative for apnea, cough, choking, chest tightness, wheezing and stridor.   Cardiovascular: Negative.   Gastrointestinal: Negative.   Endocrine: Negative.   Genitourinary: Negative.   Musculoskeletal: Negative.   Skin: Negative.   Allergic/Immunologic: Positive for environmental allergies. Negative for food allergies and immunocompromised state.  Neurological: Negative.   Hematological: Negative.   Psychiatric/Behavioral: Negative.     Patient Active Problem List   Diagnosis Date Noted  . BMI 32.0-32.9,adult 08/07/2016  . Single kidney 07/03/2016  . Elevated serum creatinine 07/02/2016  . History of abnormal cervical Pap smear 06/30/2016  .  COPD GOLD II/III  03/25/2016    Past Medical History:  Diagnosis Date  . COPD (chronic obstructive pulmonary disease) ([place])   . EP (ectopic pregnancy)      Prior to Admission medications   Medication Sig Start Date End Date Taking? Authorizing Provider  albuterol (PROVENTIL HFA;VENTOLIN HFA) 108 (90 Base) MCG/ACT inhaler Inhale 2 puffs into the lungs every 4 (four) hours as needed for wheezing or shortness of breath (cough, shortness of breath or wheezing.). 03/06/16   Scot Jun, FNP  Biotin 10000 MCG TABS biotin    [provider]  LYSINE PO lysine    [provider]  Multiple Vitamins-Minerals (CENTRUM SILVER 50+WOMEN) TABS Centrum Silver    [provider]  SUPER B COMPLEX/C PO Super B Complex + C    [provider]    No Known Allergies  Past Surgical History:  Procedure Laterality Date  . BREAST SURGERY    . EYE SURGERY    . KIDNEY DONATION    . KIDNEY DONATION    . TUBAL LIGATION      Family History  Problem Relation Age of Onset  . Lung cancer Father        smoked  . Cancer Father        lung; +TOB  . Cancer Paternal Grandmother        "every kind of cancer"  . Heart disease Mother   . Diabetes Mother     Social History   Socioeconomic History  . Marital status: Married    Spouse name: Marden Noble  . Number of children: 4  . Years of education: Not on file  . Highest education level: Associate degree: academic program  Occupational History  .  Occupation: Teacher, early years/pre: [place]  . Financial resource strain: Not on file  . Food insecurity:    Worry: Not on file    Inability: Not on file  . Transportation needs:    Medical: Not on file    Non-medical: Not on file  Tobacco Use  . Smoking status: Former Smoker    Packs/day: 1.00    Years: 41.00    Pack years: 41.00    Types: Cigarettes    Last attempt to quit: 03/03/2016    Years since quitting: 1.1  . Smokeless tobacco: Never  Used  Substance and Sexual Activity  . Alcohol use: Yes  . Drug use: Never  . Sexual activity: Not Currently    Comment: since husband's stroke in 2014  Lifestyle  . Physical activity:    Days per week: Not on file    Minutes per session: Not on file  . Stress: Not on file  Relationships  . Social connections:    Talks on phone: Not on file    Gets together: Not on file    Attends religious service: Not on file    Active member of club or organization: Not on file    Attends meetings of clubs or organizations: Not on file    Relationship status: Not on file  Other Topics Concern  . Not on file  Social History Narrative   Lives with her husband, who is disabled due to multiple cardiovascular diagnoses, and her elderly mother.   Her mother is a "pack rat." Her husband doesn't like clutter, and makes snide comments about it.   One daughter lives ne[place].   Other 2 daughters and son live near [place], [place].         Objective:  Physical Exam  Constitutional: She is oriented to person, place, and time. Vital signs are normal. She appears well-developed and well-nourished. She is active and cooperative. No distress.  BP 110/82   Pulse 81   Temp 98 F (36.7 C)   Resp 16   Ht 5\' 4"  (1.626 m)   Wt 192 lb 6.4 oz (87.3 kg)   SpO2 98%   BMI 33.03 kg/m    HENT:  Head: Normocephalic and atraumatic.  Right Ear: Hearing, tympanic membrane, external ear and ear canal normal. No foreign bodies.  Left Ear: Hearing, tympanic membrane, external ear and ear canal normal. No foreign bodies.  Nose: Nose normal.  Mouth/Throat: Uvula is midline, oropharynx is clear and moist and mucous membranes are normal. No oral lesions. Normal dentition. No dental abscesses or uvula swelling. No oropharyngeal exudate.  Eyes: Pupils are equal, round, and reactive to light. Conjunctivae, EOM and lids are normal. Right eye exhibits no discharge. Left eye exhibits no discharge. No scleral icterus.   Fundoscopic exam:      The right eye shows no arteriolar narrowing, no AV nicking, no exudate, no hemorrhage and no papilledema. The right eye shows red reflex.       The left eye shows no arteriolar narrowing, no AV nicking, no exudate, no hemorrhage and no papilledema. The left eye shows red reflex.  Neck: Trachea normal, normal range of motion and full passive range of motion without pain. Neck supple. No spinous process tenderness and no muscular tenderness present. No thyroid mass and no thyromegaly present.  Cardiovascular: Normal rate, regular rhythm, normal heart sounds, intact distal pulses and normal pulses.  Pulmonary/Chest: Effort normal and breath sounds normal.  Musculoskeletal: She exhibits no edema or tenderness.       Cervical back: Normal.       Thoracic back: Normal.       Lumbar back: Normal.  Lymphadenopathy:       Head (right side): No tonsillar, no preauricular, no posterior auricular and no occipital adenopathy present.       Head (left side): No tonsillar, no preauricular, no posterior auricular and no occipital adenopathy present.    She has no cervical adenopathy.       Right: No supraclavicular adenopathy present.       Left: No supraclavicular adenopathy present.  Neurological: She is alert and oriented to person, place, and time. She has normal strength and normal reflexes. No cranial nerve deficit. She exhibits normal muscle tone. Coordination and gait normal.  Skin: Skin is warm, dry and intact. No rash noted. She is not diaphoretic. No cyanosis or erythema. Nails show no clubbing.  Psychiatric: She has a normal mood and affect. Her speech is normal and behavior is normal. Judgment and thought content normal.     Wt Readings from Last 3 Encounters:  04/29/17 192 lb 6.4 oz (87.3 kg)  02/05/17 194 lb (88 kg)  08/07/16 188 lb 6.4 oz (85.5 kg)     Visual Acuity Screening   Right eye Left eye Both eyes  Without correction: 20/30 20/25 20/30   With  correction:         Assessment & Plan:   Problem List Items Addressed This Visit    COPD GOLD II/III     Recommend inhaled steroid. Let me know if Qvar is too expensive or causes adverse effects.      Relevant Medications   beclomethasone (QVAR) 40 MCG/ACT inhaler   Other Relevant Orders   CBC with Differential/Platelet (Completed)   Care order/instruction: (Completed)   History of abnormal cervical Pap smear    Continue follow-up per GYN.      Elevated serum creatinine    Update labs today.      Relevant Orders   Comprehensive metabolic panel (Completed)   Urinalysis, dipstick only (Completed)   BMI 32.0-32.9,adult    Recommend healthy eating choices, 150 minutes of exercise weekly.       Other Visit Diagnoses    Annual physical exam    -  Primary   Age appropriate health guidance provided.   Screening for colon cancer       Relevant Orders   Cologuard   Screening for hyperlipidemia       Relevant Orders   Lipid panel (Completed)   Screening for thyroid disorder       Relevant Orders   TSH (Completed)       Return in about 1 month (around 05/27/2017) for re-evalaution of breathing.   Fara Chute, PA-C [facility] [place] [facility]

## 2017-04-29 NOTE — Patient Instructions (Addendum)
Look into establishing with a dentist. Schedule an eye exam.    IF you received an x-ray today, you will receive an invoice from [facility]. Please contact [facility] at [phone] with questions or concerns regarding your invoice.   IF you received labwork today, you will receive an invoice from [facility]. Please contact [facility] at [phone] with questions or concerns regarding your invoice.   Our billing staff will not be able to assist you with questions regarding bills from these companies.  You will be contacted with the lab results as soon as they are available. The fastest way to get your results is to activate your My Chart account. Instructions are located on the last page of this paperwork. If you have not heard from [place] regarding the results in 2 weeks, please contact this office.      Preventive Care 40-64 Years, Female Preventive care refers to lifestyle choices and visits with your health care provider that can promote health and wellness. What does preventive care include?  A yearly physical exam. This is also called an annual well check.  Dental exams once or twice a year.  Routine eye exams. Ask your health care provider how often you should have your eyes checked.  Personal lifestyle choices, including: ? Daily care of your teeth and gums. ? Regular physical activity. ? Eating a healthy diet. ? Avoiding tobacco and drug use. ? Limiting alcohol use. ? Practicing safe sex. ? Taking low-dose aspirin daily starting at age 38. ? Taking vitamin and mineral supplements as recommended by your health care provider. What happens during an annual well check? The services and screenings done by your health care provider during your annual well check will depend on your age, overall health, lifestyle risk factors, and family history of disease. Counseling Your health care provider may ask you questions about your:  Alcohol use.  Tobacco  use.  Drug use.  Emotional well-being.  Home and relationship well-being.  Sexual activity.  Eating habits.  Work and work Statistician.  Method of birth control.  Menstrual cycle.  Pregnancy history.  Screening You may have the following tests or measurements:  Height, weight, and BMI.  Blood pressure.  Lipid and cholesterol levels. These may be checked every 5 years, or more frequently if you are over 7 years old.  Skin check.  Lung cancer screening. You may have this screening every year starting at age 15 if you have a 30-pack-year history of smoking and currently smoke or have quit within the past 15 years.  Fecal occult blood test (FOBT) of the stool. You may have this test every year starting at age 4.  Flexible sigmoidoscopy or colonoscopy. You may have a sigmoidoscopy every 5 years or a colonoscopy every 10 years starting at age 66.  Hepatitis C blood test.  Hepatitis B blood test.  Sexually transmitted disease (STD) testing.  Diabetes screening. This is done by checking your blood sugar (glucose) after you have not eaten for a while (fasting). You may have this done every 1-3 years.  Mammogram. This may be done every 1-2 years. Talk to your health care provider about when you should start having regular mammograms. This may depend on whether you have a family history of breast cancer.  BRCA-related cancer screening. This may be done if you have a family history of breast, ovarian, tubal, or peritoneal cancers.  Pelvic exam and Pap test. This may be done every 3 years starting at age 65. Starting at  age 30, this may be done every 5 years if you have a Pap test in combination with an HPV test.  Bone density scan. This is done to screen for osteoporosis. You may have this scan if you are at high risk for osteoporosis.  Discuss your test results, treatment options, and if necessary, the need for more tests with your health care provider. Vaccines Your  health care provider may recommend certain vaccines, such as:  Influenza vaccine. This is recommended every year.  Tetanus, diphtheria, and acellular pertussis (Tdap, Td) vaccine. You may need a Td booster every 10 years.  Varicella vaccine. You may need this if you have not been vaccinated.  Zoster vaccine. You may need this after age 60.  Measles, mumps, and rubella (MMR) vaccine. You may need at least one dose of MMR if you were born in 1957 or later. You may also need a second dose.  Pneumococcal 13-valent conjugate (PCV13) vaccine. You may need this if you have certain conditions and were not previously vaccinated.  Pneumococcal polysaccharide (PPSV23) vaccine. You may need one or two doses if you smoke cigarettes or if you have certain conditions.  Meningococcal vaccine. You may need this if you have certain conditions.  Hepatitis A vaccine. You may need this if you have certain conditions or if you travel or work in places where you may be exposed to hepatitis A.  Hepatitis B vaccine. You may need this if you have certain conditions or if you travel or work in places where you may be exposed to hepatitis B.  Haemophilus influenzae type b (Hib) vaccine. You may need this if you have certain conditions.  Talk to your health care provider about which screenings and vaccines you need and how often you need them. This information is not intended to replace advice given to you by your health care provider. Make sure you discuss any questions you have with your health care provider. Document Released: 02/22/2015 Document Revised: 10/16/2015 Document Reviewed: 11/27/2014 Elsevier Interactive Patient Education  2018 Elsevier Inc.  

## 2017-04-30 LAB — COMPREHENSIVE METABOLIC PANEL
ALBUMIN: 4.6 g/dL (ref 3.6–4.8)
ALK PHOS: 92 IU/L (ref 39–117)
ALT: 17 IU/L (ref 0–32)
AST: 19 IU/L (ref 0–40)
Albumin/Globulin Ratio: 2.3 — ABNORMAL HIGH (ref 1.2–2.2)
BILIRUBIN TOTAL: 0.3 mg/dL (ref 0.0–1.2)
BUN / CREAT RATIO: 13 (ref 12–28)
BUN: 13 mg/dL (ref 8–27)
CHLORIDE: 102 mmol/L (ref 96–106)
CO2: 23 mmol/L (ref 20–29)
Calcium: 9.6 mg/dL (ref 8.7–10.3)
Creatinine, Ser: 1.03 mg/dL — ABNORMAL HIGH (ref 0.57–1.00)
GFR calc Af Amer: 68 mL/min/{1.73_m2} (ref >59)
GFR calc non Af Amer: 59 mL/min/{1.73_m2} — ABNORMAL LOW (ref >59)
GLUCOSE: 90 mg/dL (ref 65–99)
Globulin, Total: 2 g/dL (ref 1.5–4.5)
POTASSIUM: 4.5 mmol/L (ref 3.5–5.2)
SODIUM: 139 mmol/L (ref 134–144)
Total Protein: 6.6 g/dL (ref 6.0–8.5)

## 2017-04-30 LAB — LIPID PANEL
CHOLESTEROL TOTAL: 207 mg/dL — AB (ref 100–199)
Chol/HDL Ratio: 2.6 ratio (ref 0.0–4.4)
HDL: 80 mg/dL (ref >39)
LDL Calculated: 106 mg/dL — ABNORMAL HIGH (ref 0–99)
Triglycerides: 106 mg/dL (ref 0–149)
VLDL CHOLESTEROL CAL: 21 mg/dL (ref 5–40)

## 2017-04-30 LAB — CBC WITH DIFFERENTIAL/PLATELET
BASOS ABS: 0 10*3/uL (ref 0.0–0.2)
BASOS: 1 %
EOS (ABSOLUTE): 0.3 10*3/uL (ref 0.0–0.4)
Eos: 4 %
HEMOGLOBIN: 13.5 g/dL (ref 11.1–15.9)
Hematocrit: 42.4 % (ref 34.0–46.6)
Immature Grans (Abs): 0 10*3/uL (ref 0.0–0.1)
Immature Granulocytes: 0 %
LYMPHS ABS: 2.4 10*3/uL (ref 0.7–3.1)
Lymphs: 36 %
MCH: 30.2 pg (ref 26.6–33.0)
MCHC: 31.8 g/dL (ref 31.5–35.7)
MCV: 95 fL (ref 79–97)
Monocytes Absolute: 0.5 10*3/uL (ref 0.1–0.9)
Monocytes: 8 %
NEUTROS ABS: 3.3 10*3/uL (ref 1.4–7.0)
Neutrophils: 51 %
PLATELETS: 230 10*3/uL (ref 150–379)
RBC: 4.47 x10E6/uL (ref 3.77–5.28)
RDW: 14.8 % (ref 12.3–15.4)
WBC: 6.6 10*3/uL (ref 3.4–10.8)

## 2017-04-30 LAB — TSH: TSH: 1.05 u[IU]/mL (ref 0.450–4.500)

## 2017-04-30 LAB — URINALYSIS, DIPSTICK ONLY
BILIRUBIN UA: NEGATIVE
Glucose, UA: NEGATIVE
KETONES UA: NEGATIVE
Leukocytes, UA: NEGATIVE
NITRITE UA: NEGATIVE
PH UA: 6 (ref 5.0–7.5)
Protein, UA: NEGATIVE
RBC UA: NEGATIVE
SPEC GRAV UA: 1.005 (ref 1.005–1.030)
UUROB: 0.2 mg/dL (ref 0.2–1.0)

## 2017-05-06 ENCOUNTER — Encounter: Payer: Self-pay | Admitting: Physician Assistant

## 2017-05-16 NOTE — Assessment & Plan Note (Signed)
Recommend inhaled steroid. Let me know if Qvar is too expensive or causes adverse effects.

## 2017-05-16 NOTE — Assessment & Plan Note (Signed)
Update labs today 

## 2017-05-16 NOTE — Assessment & Plan Note (Signed)
Continue follow-up per GYN.

## 2017-05-16 NOTE — Assessment & Plan Note (Signed)
Recommend healthy eating choices, 150 minutes of exercise weekly.

## 2017-05-17 ENCOUNTER — Encounter: Payer: Self-pay | Admitting: Physician Assistant

## 2017-05-19 ENCOUNTER — Encounter: Payer: Self-pay | Admitting: Physician Assistant

## 2017-05-20 ENCOUNTER — Ambulatory Visit: Payer: Self-pay

## 2017-05-20 NOTE — Telephone Encounter (Signed)
Pt began with cough, URI sx, sore throat,nausea, diarrhea and fever 100.2. Pt SOB with walking or activity over the weekend. Pt using resuce inhaler for continued wheezing. Advised UCC, pt refused. Wants only to see her PCP tomorrow.  Pt with h/o COPD. No longer with fever and is at work today. Care advice given. Pt to see PCP Friday at 4 pm. Reason for Disposition . Wheezing is present    Pt wants to see PCP . Cough with cold symptoms (e.g., runny nose, postnasal drip, throat clearing)  Answer Assessment - Initial Assessment Questions 1. ONSET: "When did the cough begin?"      1 week 2. SEVERITY: "How bad is the cough today?"      frequent congested cough 3. RESPIRATORY DISTRESS: "Describe your breathing."      SOB 4. FEVER: "Do you have a fever?" If so, ask: "What is your temperature, how was it measured, and when did it start?"     No  5. SPUTUM: "Describe the color of your sputum" (clear, white, yellow, green)     clear 6. HEMOPTYSIS: "Are you coughing up any blood?" If so ask: "How much?" (flecks, streaks, tablespoons, etc.)     no 7. CARDIAC HISTORY: "Do you have any history of heart disease?" (e.g., heart attack, congestive heart failure)      No  8. LUNG HISTORY: "Do you have any history of lung disease?"  (e.g., pulmonary embolus, asthma, emphysema)     COPD  9. PE RISK FACTORS: "Do you have a history of blood clots?" (or: recent major surgery, recent prolonged travel, bedridden )     no 10. OTHER SYMPTOMS: "Do you have any other symptoms?" (e.g., runny nose, wheezing, chest pain)       Runny nose, wheezing,abdominal pain 11. PREGNANCY: "Is there any chance you are pregnant?" "When was your last menstrual period?"       n/a 12. TRAVEL: "Have you traveled out of the country in the last month?" (e.g., travel history, exposures)       no  Protocols used: [place]

## 2017-05-21 ENCOUNTER — Encounter: Payer: Self-pay | Admitting: Physician Assistant

## 2017-05-21 ENCOUNTER — Other Ambulatory Visit: Payer: Self-pay

## 2017-05-21 ENCOUNTER — Ambulatory Visit: Payer: 59 | Admitting: Physician Assistant

## 2017-05-21 VITALS — BP 153/94 | HR 83 | Temp 98.7°F | Resp 16 | Ht 63.98 in | Wt 189.3 lb

## 2017-05-21 DIAGNOSIS — J441 Chronic obstructive pulmonary disease with (acute) exacerbation: Secondary | ICD-10-CM | POA: Diagnosis not present

## 2017-05-21 DIAGNOSIS — J019 Acute sinusitis, unspecified: Secondary | ICD-10-CM

## 2017-05-21 MED ORDER — GUAIFENESIN ER 1200 MG PO TB12: 1.0000 | ORAL_TABLET | Freq: Two times a day (BID) | ORAL | 1 refills | Status: DC | PRN

## 2017-05-21 MED ORDER — BENZONATATE 100 MG PO CAPS: 100.0000 mg | ORAL_CAPSULE | Freq: Three times a day (TID) | ORAL | 0 refills | Status: DC | PRN

## 2017-05-21 MED ORDER — AZELASTINE HCL 0.1 % NA SOLN: 2.0000 | Freq: Two times a day (BID) | NASAL | 0 refills | Status: DC

## 2017-05-21 MED ORDER — HYDROCODONE-CHLORPHENIRAMINE 5-4 MG/5ML PO SOLN: 5.0000 mL | Freq: Two times a day (BID) | ORAL | 0 refills | Status: DC | PRN

## 2017-05-21 MED ORDER — MOXIFLOXACIN HCL 400 MG PO TABS: 400.0000 mg | ORAL_TABLET | Freq: Every day | ORAL | 0 refills | Status: DC

## 2017-05-21 MED FILL — MOXIFLOXACIN HCL 400 MG TAB: 400 | 10 days supply | Qty: 10 | Fill #0

## 2017-05-21 MED FILL — HYDROCODONE-CHLORPHEN ER SU: 10-8 | 5 days supply | Qty: 50 | Fill #0

## 2017-05-21 MED FILL — AZELASTINE HCL 137 MCG SPRY: 0.1 | 30 days supply | Qty: 30 | Fill #0

## 2017-05-21 NOTE — Progress Notes (Signed)
Subjective:    Patient ID: Gabrielle Castro, female    DOB: August 07, 1955, 62 y.o.   MRN: [id number] Chief Complaint  Patient presents with  . Cough    X 1 week- pt states cough/congestion/pt has copd  . Shortness of Breath  . Hoarse    X 2 days    HPI  Per pt e-mail, "Last Wednesday (April 3rd, 2019) I started feeling like I was getting allergies and had a temp of 100.2, runny nose, sneezing, coughing. 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  Reports taking DayQuil and NyQuil. Reports using her rescue inhaler more frequently than normal. Reports taking peptobismol over the past few days, but has not taken it within the past 2 days.  Cholesterol looks better   Review of Systems  Constitutional: Positive for fatigue (due to cough). Negative for chills, fever and unexpected weight change.  HENT: Positive for congestion, rhinorrhea, sore throat ("swollen") and voice change. Negative for ear pain, postnasal drip, sinus pressure, sinus pain, sneezing, tinnitus and trouble swallowing.   Eyes: Negative.   Respiratory: Positive for cough, chest tightness, shortness of breath and wheezing.   Cardiovascular: Positive for chest pain.  Gastrointestinal: Positive for abdominal pain, diarrhea and nausea. Negative for blood in stool, constipation and vomiting.  Endocrine: Negative.   Genitourinary: Negative.   Musculoskeletal: Positive for back pain.  Skin: Negative.   Allergic/Immunologic: Negative.   Neurological: Negative.   Hematological: Negative.   Psychiatric/Behavioral: Negative.     Patient Active Problem List   Diagnosis Date Noted  . BMI 32.0-32.9,adult  08/07/2016  . Single kidney 07/03/2016  . Elevated serum creatinine 07/02/2016  . History of abnormal cervical Pap smear 06/30/2016  . COPD GOLD II/III  03/25/2016   Past Medical History:  Diagnosis Date  . COPD (chronic obstructive pulmonary disease) ([place])   . EP (ectopic pregnancy)    Prior to Admission medications   Medication Sig Start Date End Date Taking? Authorizing Provider  albuterol (PROVENTIL HFA;VENTOLIN HFA) 108 (90 Base) MCG/ACT inhaler Inhale 2 puffs into the lungs every 4 (four) hours as needed for wheezing or shortness of breath (cough, shortness of breath or wheezing.). 03/06/16  Yes Scot Jun, FNP  beclomethasone (QVAR) 40 MCG/ACT inhaler Inhale 2 puffs into the lungs 2 (two) times daily. 04/29/17  Yes Harrison Mons, PA-C  Biotin 10000 MCG TABS biotin   Yes [provider]  LYSINE PO lysine   Yes [provider]  Multiple Vitamins-Minerals (CENTRUM SILVER 50+WOMEN) TABS Centrum Silver   Yes [provider]  SUPER B COMPLEX/C PO Super B Complex + C   Yes [provider]   No Known Allergies     Objective:   Physical Exam  Constitutional: She is oriented to person, place, and time. She appears well-developed and well-nourished. No distress.  BP (!) 153/94   Pulse 83   Temp 98.7 F (37.1 C) (Oral)   Resp 16   Ht 5' 3.98" (1.625 m)   Wt 189 lb 4.8 oz (85.9 kg)   SpO2 97%   BMI 32.52 kg/m    HENT:  Head: Normocephalic and atraumatic.  Right Ear: External ear normal.  Left Ear: External ear  normal.  Nose: Nose normal.  Mouth/Throat: No oropharyngeal exudate.  Eyes: Pupils are equal, round, and reactive to light. Conjunctivae and EOM are normal. Right eye exhibits no discharge. Left eye exhibits no discharge.  Neck: Normal range of motion. Neck supple. No tracheal deviation present. No thyromegaly present.  Cardiovascular: Normal rate, regular rhythm and intact distal pulses. Exam reveals no gallop and no friction  rub.  No murmur heard. Pulses:      Radial pulses are 2+ on the right side.       Dorsalis pedis pulses are 2+ on the right side.       Posterior tibial pulses are 2+ on the right side.  Pulmonary/Chest: Effort normal and breath sounds normal. No respiratory distress. She has no wheezes. She has no rales.  Musculoskeletal: Normal range of motion. She exhibits no edema.  Neurological: She is alert and oriented to person, place, and time. She has normal reflexes.  Skin: Skin is warm and dry. She is not diaphoretic. No erythema.  Psychiatric: She has a normal mood and affect. Her behavior is normal.      Assessment & Plan:  1. COPD exacerbation ([place]) Despite normal lung exam, underlying COPD puts pt at risk for infection. FQ to cover respiratory causes and possible sinusitis. Other meds given for supportive measures.  - moxifloxacin (AVELOX) 400 MG tablet; Take 1 tablet (400 mg total) by mouth daily.  Dispense: 10 tablet; Refill: 0 - azelastine (ASTELIN) 0.1 % nasal spray; Place 2 sprays into both nostrils 2 (two) times daily. Use in each nostril as directed  Dispense: 30 mL; Refill: 0 - Guaifenesin (MUCINEX MAXIMUM STRENGTH) 1200 MG TB12; Take 1 tablet (1,200 mg total) by mouth every 12 (twelve) hours as needed.  Dispense: 14 tablet; Refill: 1 - HYDROcodone-Chlorpheniramine 5-4 MG/5ML SOLN; Take 5 mLs by mouth every 12 (twelve) hours as needed (cough).  Dispense: 50 mL; Refill: 0  2. Acute non-recurrent sinusitis, unspecified location See above.   Try azelastine and mucinex to help decrease symptoms and congestion. - moxifloxacin (AVELOX) 400 MG tablet; Take 1 tablet (400 mg total) by mouth daily.  Dispense: 10 tablet; Refill: 0 - azelastine (ASTELIN) 0.1 % nasal spray; Place 2 sprays into both nostrils 2 (two) times daily. Use in each nostril as directed  Dispense: 30 mL; Refill: 0 - Guaifenesin (MUCINEX MAXIMUM STRENGTH) 1200 MG TB12; Take 1 tablet (1,200 mg total) by mouth every 12  (twelve) hours as needed.  Dispense: 14 tablet; Refill: 1

## 2017-05-21 NOTE — Progress Notes (Signed)
Patient ID: Gabrielle Castro, female    DOB: 1955-03-22, 62 y.o.   MRN: [id number]  PCP: Harrison Mons, PA-C  Chief Complaint  Patient presents with  . Cough    X 1 week- pt states cough/congestion/pt has copd  . Shortness of Breath  . Hoarse    X 2 days    Subjective:   Presents for evaluation of respiratory illness.  Patient developed symptoms that she thought were allergies beginning Wednesday 05/12/2017.  She developed a low-grade fever, temperature 100.2, runny nose, sneezing, cough.  The following morning she awoke with a very sore throat, increased nasal congestion, increased coughing, and new symptoms of nausea and diarrhea.  Coughing occurs in episodes associated with shortness of breath.  Becomes short of breath with minimal exertion and difficulty sleeping due to cough, resulting in increased fatigue.  OTC DayQuil and NyQuil.  Increased use of albuterol inhaler.  Pepto-Bismol helped with the GI symptoms..   Review of Systems Constitutional: Positive for fatigue (due to cough). Negative for chills, fever and unexpected weight change.  HENT: Positive for congestion, rhinorrhea, sore throat ("swollen") and voice change. Negative for ear pain, postnasal drip, sinus pressure, sinus pain, sneezing, tinnitus and trouble swallowing.   Eyes: Negative.   Respiratory: Positive for cough, chest tightness, shortness of breath and wheezing.   Cardiovascular: Positive for chest pain.  Gastrointestinal: Positive for abdominal pain, diarrhea and nausea. Negative for blood in stool, constipation and vomiting.  Endocrine: Negative.   Genitourinary: Negative.   Musculoskeletal: Positive for back pain.  Skin: Negative.   Allergic/Immunologic: Negative.   Neurological: Negative.   Hematological: Negative.   Psychiatric/Behavioral: Negative.        Patient Active Problem List   Diagnosis Date Noted  . BMI 32.0-32.9,adult 08/07/2016  . Single kidney 07/03/2016  . Elevated serum  creatinine 07/02/2016  . History of abnormal cervical Pap smear 06/30/2016  . COPD GOLD II/III  03/25/2016     Prior to Admission medications   Medication Sig Start Date End Date Taking? Authorizing Provider  albuterol (PROVENTIL HFA;VENTOLIN HFA) 108 (90 Base) MCG/ACT inhaler Inhale 2 puffs into the lungs every 4 (four) hours as needed for wheezing or shortness of breath (cough, shortness of breath or wheezing.). 03/06/16  Yes Scot Jun, FNP  beclomethasone (QVAR) 40 MCG/ACT inhaler Inhale 2 puffs into the lungs 2 (two) times daily. 04/29/17  Yes Harrison Mons, PA-C  Biotin 10000 MCG TABS biotin   Yes [provider]  LYSINE PO lysine   Yes [provider]  Multiple Vitamins-Minerals (CENTRUM SILVER 50+WOMEN) TABS Centrum Silver   Yes [provider]  SUPER B COMPLEX/C PO Super B Complex + C   Yes [provider]     No Known Allergies     Objective:  Physical Exam  Constitutional: She is oriented to person, place, and time. She appears well-developed and well-nourished. She is active and cooperative. No distress.  BP (!) 153/94   Pulse 83   Temp 98.7 F (37.1 C) (Oral)   Resp 16   Ht 5' 3.98" (1.625 m)   Wt 189 lb 4.8 oz (85.9 kg)   SpO2 97%   BMI 32.52 kg/m   HENT:  Head: Normocephalic and atraumatic.  Right Ear: Hearing, tympanic membrane, external ear and ear canal normal.  Left Ear: Hearing, tympanic membrane, external ear and ear canal normal.  Nose: Nose normal. Right sinus exhibits no maxillary sinus tenderness and no frontal sinus tenderness. Left  sinus exhibits no maxillary sinus tenderness and no frontal sinus tenderness.  Mouth/Throat: Uvula is midline, oropharynx is clear and moist and mucous membranes are normal.  Eyes: Conjunctivae are normal. No scleral icterus.  Neck: Normal range of motion. Neck supple. No thyromegaly present.  Cardiovascular: Normal rate, regular rhythm and normal heart sounds.  Pulses:       Radial pulses are 2+ on the right side, and 2+ on the left side.  Pulmonary/Chest: Effort normal and breath sounds normal.  Lymphadenopathy:       Head (right side): No tonsillar, no preauricular, no posterior auricular and no occipital adenopathy present.       Head (left side): No tonsillar, no preauricular, no posterior auricular and no occipital adenopathy present.    She has no cervical adenopathy.       Right: No supraclavicular adenopathy present.       Left: No supraclavicular adenopathy present.  Neurological: She is alert and oriented to person, place, and time. No sensory deficit.  Skin: Skin is warm, dry and intact. No rash noted. No cyanosis or erythema. Nails show no clubbing.  Psychiatric: She has a normal mood and affect. Her speech is normal and behavior is normal.           Assessment & Plan:   1. COPD exacerbation ([place]) 2. Acute non-recurrent sinusitis, unspecified location Despite normal exam, I suspect this is a COPD exacerbation and sinusitis, given her symptoms and duration.  Fluoroquinolone to cover both.  Supportive care.  Anticipatory guidance provided. - moxifloxacin (AVELOX) 400 MG tablet; Take 1 tablet (400 mg total) by mouth daily.  Dispense: 10 tablet; Refill: 0 - azelastine (ASTELIN) 0.1 % nasal spray; Place 2 sprays into both nostrils 2 (two) times daily. Use in each nostril as directed  Dispense: 30 mL; Refill: 0 - Guaifenesin (MUCINEX MAXIMUM STRENGTH) 1200 MG TB12; Take 1 tablet (1,200 mg total) by mouth every 12 (twelve) hours as needed.  Dispense: 14 tablet; Refill: 1 - HYDROcodone-Chlorpheniramine 5-4 MG/5ML SOLN; Take 5 mLs by mouth every 12 (twelve) hours as needed (cough).  Dispense: 50 mL; Refill: 0     Return if symptoms worsen or fail to improve.   Fara Chute, PA-C [facility] [place]

## 2017-05-21 NOTE — Patient Instructions (Addendum)
REST! Drink lots of water. Use the rescue inhaler    IF you received an x-ray today, you will receive an invoice from [facility]. Please contact [facility] at [phone] with questions or concerns regarding your invoice.   IF you received labwork today, you will receive an invoice from [facility]. Please contact [facility] at [phone] with questions or concerns regarding your invoice.   Our billing staff will not be able to assist you with questions regarding bills from these companies.  You will be contacted with the lab results as soon as they are available. The fastest way to get your results is to activate your My Chart account. Instructions are located on the last page of this paperwork. If you have not heard from [place] regarding the results in 2 weeks, please contact this office.

## 2017-05-26 MED FILL — QVAR REDIHALER 40 MCG/ACT A: 40 | 30 days supply | Qty: 11 | Fill #1

## 2017-06-02 ENCOUNTER — Other Ambulatory Visit: Payer: Self-pay

## 2017-06-02 ENCOUNTER — Encounter: Payer: Self-pay | Admitting: Physician Assistant

## 2017-06-02 ENCOUNTER — Ambulatory Visit: Payer: 59 | Admitting: Physician Assistant

## 2017-06-02 VITALS — BP 110/70 | HR 80 | Temp 98.5°F | Resp 18 | Ht 63.98 in | Wt 191.6 lb

## 2017-06-02 DIAGNOSIS — J449 Chronic obstructive pulmonary disease, unspecified: Secondary | ICD-10-CM

## 2017-06-02 MED ORDER — ALBUTEROL SULFATE HFA 108 (90 BASE) MCG/ACT IN AERS: 2.0000 | INHALATION_SPRAY | RESPIRATORY_TRACT | 1 refills | Status: DC | PRN

## 2017-06-02 MED FILL — VENTOLIN HFA 90 MCG INHALER: 108 (90 BAS | 17 days supply | Qty: 18 | Fill #0

## 2017-06-02 NOTE — Progress Notes (Signed)
   Subjective:    Patient ID: Gabrielle Castro, female    DOB: [birth year], 62 y.o.   MRN: [id number] Chief Complaint  Patient presents with  . Medication Management    Pt states she is here to f/u on inhaler Rx at last visit. Pt states cough and breathing issues are gone.  . Medication Refill    QVAR 78 MCG    HPI  62 yo female presents for f/u of COPD exacerbation on 05/21/2017. She has been using her beclomethasone (QVAR) inhaler with significant relief and help. She states she is mostly here to let us know how well she is doing and to get a refill of albuterol. Planning to start riding her bike for exercise.  She has been taking Claritin with significant relief, no longer having symptoms.    Denies any cough or SOB at rest or exertion, fevers, chills. Denies smoking, quit 02/2016.  Review of Systems  Constitutional: Negative for chills, diaphoresis, fatigue and fever.  HENT: Negative for congestion, postnasal drip, rhinorrhea, sinus pressure, sinus pain and sneezing.   Eyes: Negative.   Respiratory: Negative for cough, choking, chest tightness, shortness of breath, wheezing and stridor.   Gastrointestinal: Negative.   Endocrine: Negative.   Genitourinary: Negative.   Musculoskeletal: Negative.   Allergic/Immunologic: Positive for environmental allergies.  Neurological: Negative.   Hematological: Negative.   Psychiatric/Behavioral: Negative.        Objective:   Physical Exam  Constitutional: She is oriented to person, place, and time. She appears well-developed and well-nourished. No distress.  BP 110/70 (BP Location: Left Arm, Patient Position: Sitting, Cuff Size: Large)   Pulse 80   Temp 98.5 F (36.9 C) (Oral)   Resp 18   Ht 5' 3.98" (1.625 m)   Wt 191 lb 9.6 oz (86.9 kg)   SpO2 98%   BMI 32.91 kg/m    HENT:  Head: Normocephalic and atraumatic.  Eyes: Pupils are equal, round, and reactive to light. Conjunctivae and EOM are normal. Right eye exhibits no discharge.  Left eye exhibits no discharge.  Neck: Normal range of motion. Neck supple. No tracheal deviation present. No thyromegaly present.  Cardiovascular: Normal rate, regular rhythm and intact distal pulses. Exam reveals no gallop and no friction rub.  No murmur heard. Pulses:      Radial pulses are 2+ on the right side, and 2+ on the left side.       Dorsalis pedis pulses are 2+ on the right side, and 2+ on the left side.       Posterior tibial pulses are 2+ on the right side, and 2+ on the left side.  Pulmonary/Chest: Effort normal and breath sounds normal. No respiratory distress. She has no wheezes. She has no rales.  Musculoskeletal: Normal range of motion. She exhibits no edema.  Neurological: She is alert and oriented to person, place, and time. She has normal reflexes.  Skin: Skin is warm and dry. She is not diaphoretic. No erythema.  Psychiatric: She has a normal mood and affect. Her behavior is normal.       Assessment & Plan:  1. COPD GOLD II/III  Controlled on QVAR.  - albuterol (PROVENTIL HFA;VENTOLIN HFA) 108 (90 Base) MCG/ACT inhaler; Inhale 2 puffs into the lungs every 4 (four) hours as needed for wheezing or shortness of breath (cough, shortness of breath or wheezing.).  Dispense: 1 Inhaler; Refill: 1  Return in about 6 months (around 12/02/2017) for re-evalaution of COPD.

## 2017-06-02 NOTE — Patient Instructions (Addendum)
Keep up the good work! Enjoy biking and be safe!    IF you received an x-ray today, you will receive an invoice from [facility]. Please contact [facility] at [phone] with questions or concerns regarding your invoice.   IF you received labwork today, you will receive an invoice from [place] [facility]. Please contact [facility] at [phone] with questions or concerns regarding your invoice.   Our billing staff will not be able to assist you with questions regarding bills from these companies.  You will be contacted with the lab results as soon as they are available. The fastest way to get your results is to activate your My Chart account. Instructions are located on the last page of this paperwork. If you have not heard from [place] regarding the results in 2 weeks, please contact this office.

## 2017-06-02 NOTE — Progress Notes (Signed)
Patient ID: Gabrielle Castro, female    DOB: 1955/08/19, 62 y.o.   MRN: [id number]  PCP: Harrison Mons, PA-C  Chief Complaint  Patient presents with  . Medication Management    Pt states she is here to f/u on inhaler Rx at last visit.    Subjective:   Presents for evaluation of recent COPD exacerbation.  Doing really well. Back to normal. Needs refills of medications.    Review of Systems  Constitutional: Negative.   HENT: Negative for sore throat.   Eyes: Negative for visual disturbance.  Respiratory: Negative for apnea, cough, choking, chest tightness, shortness of breath, wheezing and stridor.   Cardiovascular: Negative for chest pain and palpitations.  Gastrointestinal: Negative for abdominal pain, diarrhea, nausea and vomiting.  Genitourinary: Negative for dysuria, frequency, hematuria and urgency.  Musculoskeletal: Negative for arthralgias and myalgias.  Skin: Negative for rash.  Neurological: Negative for dizziness, weakness and headaches.  Psychiatric/Behavioral: Negative for decreased concentration. The patient is not nervous/anxious.        Patient Active Problem List   Diagnosis Date Noted  . BMI 32.0-32.9,adult 08/07/2016  . Single kidney 07/03/2016  . Elevated serum creatinine 07/02/2016  . History of abnormal cervical Pap smear 06/30/2016  . COPD GOLD II/III  03/25/2016     Prior to Admission medications   Medication Sig Start Date End Date Taking? Authorizing Provider  albuterol (PROVENTIL HFA;VENTOLIN HFA) 108 (90 Base) MCG/ACT inhaler Inhale 2 puffs into the lungs every 4 (four) hours as needed for wheezing or shortness of breath (cough, shortness of breath or wheezing.). 03/06/16  Yes Scot Jun, FNP  beclomethasone (QVAR) 40 MCG/ACT inhaler Inhale 2 puffs into the lungs 2 (two) times daily. 04/29/17  Yes Harrison Mons, PA-C  Biotin 10000 MCG TABS biotin   Yes [provider]  LYSINE PO lysine   Yes [provider]   Multiple Vitamins-Minerals (CENTRUM SILVER 50+WOMEN) TABS Centrum Silver   Yes [provider]  SUPER B COMPLEX/C PO Super B Complex + C   Yes [provider]     No Known Allergies     Objective:  Physical Exam  Constitutional: She is oriented to person, place, and time. She appears well-developed and well-nourished. She is active and cooperative. No distress.  BP 110/70 (BP Location: Left Arm, Patient Position: Sitting, Cuff Size: Large)   Pulse 80   Temp 98.5 F (36.9 C) (Oral)   Resp 18   Ht 5' 3.98" (1.625 m)   Wt 191 lb 9.6 oz (86.9 kg)   SpO2 98%   BMI 32.91 kg/m   HENT:  Head: Normocephalic and atraumatic.  Right Ear: Hearing normal.  Left Ear: Hearing normal.  Eyes: Conjunctivae are normal. No scleral icterus.  Neck: Normal range of motion. Neck supple. No thyromegaly present.  Cardiovascular: Normal rate, regular rhythm and normal heart sounds.  Pulses:      Radial pulses are 2+ on the right side, and 2+ on the left side.  Pulmonary/Chest: Effort normal and breath sounds normal.  Lymphadenopathy:       Head (right side): No tonsillar, no preauricular, no posterior auricular and no occipital adenopathy present.       Head (left side): No tonsillar, no preauricular, no posterior auricular and no occipital adenopathy present.    She has no cervical adenopathy.       Right: No supraclavicular adenopathy present.       Left: No supraclavicular adenopathy present.  Neurological: She  is alert and oriented to person, place, and time. No sensory deficit.  Skin: Skin is warm, dry and intact. No rash noted. No cyanosis or erythema. Nails show no clubbing.  Psychiatric: She has a normal mood and affect. Her speech is normal and behavior is normal.           Assessment & Plan:   Problem List Items Addressed This Visit    COPD GOLD II/III  - Primary    Recent exacerbation resolved. Continue daily steroid inhaler and PRN albuterol rescue.       Relevant Medications   albuterol (PROVENTIL HFA;VENTOLIN HFA) 108 (90 Base) MCG/ACT inhaler       Return in about 6 months (around 12/02/2017) for re-evalaution of COPD.   Fara Chute, PA-C [facility] at [place]

## 2017-06-05 NOTE — Assessment & Plan Note (Signed)
Recent exacerbation resolved. Continue daily steroid inhaler and PRN albuterol rescue.

## 2017-06-09 DIAGNOSIS — Z1211 Encounter for screening for malignant neoplasm of colon: Secondary | ICD-10-CM | POA: Diagnosis not present

## 2017-06-09 DIAGNOSIS — Z1212 Encounter for screening for malignant neoplasm of rectum: Secondary | ICD-10-CM | POA: Diagnosis not present

## 2017-06-16 ENCOUNTER — Telehealth: Payer: 59 | Admitting: Family Medicine

## 2017-06-16 ENCOUNTER — Encounter: Payer: Self-pay | Admitting: Physician Assistant

## 2017-06-16 DIAGNOSIS — R399 Unspecified symptoms and signs involving the genitourinary system: Secondary | ICD-10-CM | POA: Diagnosis not present

## 2017-06-16 NOTE — Progress Notes (Signed)
Based on what you shared with me it looks like you have a condition that should be evaluated in a face to face office visit to ensure a more serious medical condition does not exist.  NOTE: If you entered your credit card information for this eVisit, you will not be charged. You may see a "hold" on your card for the $30 but that hold will drop off and you will not have a charge processed.  If you are having a true medical emergency please call 911.  If you need an urgent face to face visit, [facility] has four urgent care centers for your convenience.  If you need care fast and have a high deductible or no insurance consider:   [email or web address] to reserve your spot online an avoid wait times  [facility] [address] [place], [place] [address] 8 am to 8 pm Monday-Friday 10 am to 4 pm Saturday-Sunday *Across the street from [employer]  [place], [address] 8 am to 5 pm Monday-Friday * In the [facility] on the [facility]   The following sites will take your  insurance:  . [facility]gent [place] a Provider at this Location  [address] [place], [place] [address] . 10 am to 8 pm Monday-Friday . 12 pm to 8 pm Saturday-Sunday   . [facility] at [place] a Provider at this Location  [address], [place], [place] [address] . 8 am to 8 pm Monday-Friday . 9 am to 6 pm Saturday . 11 am to 6 pm Sunday   . [facility] at [place] Get Driving Directions  [address] [place], [place] [address] . 8 am to 8 pm Monday-Friday . 8 am to 4 pm Saturday-Sunday   Your e-visit answers were reviewed by a board certified advanced clinical practitioner to complete your personal care plan.  Thank you for using e-Visits.

## 2017-06-17 ENCOUNTER — Other Ambulatory Visit: Payer: Self-pay

## 2017-06-17 ENCOUNTER — Other Ambulatory Visit: Payer: Self-pay | Admitting: Physician Assistant

## 2017-06-17 ENCOUNTER — Telehealth: Payer: Self-pay

## 2017-06-17 ENCOUNTER — Ambulatory Visit (INDEPENDENT_AMBULATORY_CARE_PROVIDER_SITE_OTHER): Payer: 59

## 2017-06-17 ENCOUNTER — Encounter: Payer: Self-pay | Admitting: Physician Assistant

## 2017-06-17 ENCOUNTER — Ambulatory Visit: Payer: 59 | Admitting: Physician Assistant

## 2017-06-17 VITALS — BP 118/80 | HR 91 | Temp 98.0°F | Resp 16 | Ht 63.98 in | Wt 191.8 lb

## 2017-06-17 DIAGNOSIS — R1032 Left lower quadrant pain: Secondary | ICD-10-CM

## 2017-06-17 DIAGNOSIS — R350 Frequency of micturition: Secondary | ICD-10-CM

## 2017-06-17 DIAGNOSIS — R1030 Lower abdominal pain, unspecified: Secondary | ICD-10-CM | POA: Diagnosis not present

## 2017-06-17 LAB — POC MICROSCOPIC URINALYSIS (UMFC): MUCUS RE: ABSENT

## 2017-06-17 LAB — POCT URINALYSIS DIP (MANUAL ENTRY)
BILIRUBIN UA: NEGATIVE mg/dL
Bilirubin, UA: NEGATIVE
Glucose, UA: NEGATIVE mg/dL
Leukocytes, UA: NEGATIVE
Nitrite, UA: NEGATIVE
PH UA: 6 (ref 5.0–8.0)
Protein Ur, POC: NEGATIVE mg/dL
RBC UA: NEGATIVE
UROBILINOGEN UA: 0.2 U/dL

## 2017-06-17 MED ORDER — AMOXICILLIN-POT CLAVULANATE 875-125 MG PO TABS: 1.0000 | ORAL_TABLET | Freq: Three times a day (TID) | ORAL | 0 refills | Status: AC

## 2017-06-17 MED FILL — AMOX-CLAV 875-125 MG TABLET: 875-125 | 10 days supply | Qty: 30 | Fill #0

## 2017-06-17 NOTE — Telephone Encounter (Signed)
Mychart message with concerns already forwarded to chelle.

## 2017-06-17 NOTE — Progress Notes (Signed)
Patient ID: Gabrielle Castro, female    DOB: 1955/12/10, 62 y.o.   MRN: [id number]  PCP: Harrison Mons, PA-C  Chief Complaint  Patient presents with  . Urinary Tract Infection    onset: last week, freq urination but no burning, burns in gut and down leg and feels like labour pains.  Family hx of kidney stones, Azo home test positive and took two azo pills last night.  Pain comes and goes, pain level 8/10    Subjective:   Presents for evaluation of possible UTI.  Symptoms began last week, and have become progressively worse. No burning with urination, but burning sensation in the low pelvis. Increased urinary frequency, and urgency (but with full volume of urine). Cramping pain, like with labor contractions. Pain is extending into the LEFT upper leg.  Feels irritable, subjective fever. Home test was positive for UTI. Tried to get treatment via an e-visit, but was advised that she needed an evaluation in the office. OTC AZO last night with some benefit. Drinking Cranberry juice.  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  No change in BM. No hematuria. No saddle anesthesia. No lower extremity weakness.   Review of Systems As above.      Patient Active Problem List   Diagnosis Date Noted  . BMI 32.0-32.9,adult 08/07/2016  . Single kidney 07/03/2016  . Elevated serum creatinine 07/02/2016  . History of abnormal cervical Pap smear 06/30/2016  . COPD GOLD II/III  03/25/2016     Prior to Admission medications   Medication Sig Start Date End Date Taking? Authorizing Provider  beclomethasone (QVAR) 40 MCG/ACT inhaler Inhale 2 puffs into the lungs 2 (two) times daily. 04/29/17  Yes Harrison Mons, PA-C  Biotin 10000 MCG TABS biotin   Yes [provider]  LYSINE PO lysine   Yes [provider]  Multiple Vitamins-Minerals (CENTRUM SILVER 50+WOMEN) TABS Centrum Silver   Yes [provider]  SUPER B COMPLEX/C PO Super B Complex + C    Yes [provider]  albuterol (PROVENTIL HFA;VENTOLIN HFA) 108 (90 Base) MCG/ACT inhaler Inhale 2 puffs into the lungs every 4 (four) hours as needed for wheezing or shortness of breath (cough, shortness of breath or wheezing.). Patient not taking: Reported on 06/17/2017 06/02/17   Harrison Mons, PA-C     Allergies  Allergen Reactions  . Nickel Swelling, Hives and Itching  . Tape Rash       Objective:  Physical Exam  Constitutional: She is oriented to person, place, and time. Vital signs are normal. She appears well-developed and well-nourished. No distress.  HENT:  Head: Normocephalic and atraumatic.  Cardiovascular: Normal rate, regular rhythm and normal heart sounds.  Pulmonary/Chest: Effort normal and breath sounds normal.  Abdominal: Soft. Normal appearance and bowel sounds are normal. She exhibits no distension and no mass. There is no hepatosplenomegaly. There is tenderness in the suprapubic area and left lower quadrant. There is no rigidity, no rebound, no guarding, no CVA tenderness, no tenderness at McBurney's point and negative Murphy's sign. No hernia.  Musculoskeletal: Normal range of motion.       Lumbar back: Normal.  Neurological: She is alert and oriented to person, place, and time.  Skin: Skin is warm and dry. No rash noted. She is not diaphoretic. No pallor.  Psychiatric: She has a normal mood and affect. Her speech is normal and behavior is normal. Judgment normal.   Results for orders placed or performed in visit on 06/17/17  POCT urinalysis dipstick  Result Value Ref Range   Color, UA yellow yellow   Clarity, UA clear clear   Glucose, UA negative negative mg/dL   Bilirubin, UA negative negative   Ketones, POC UA negative negative mg/dL   Spec Grav, UA <=1.005 (A) 1.010 - 1.025   Blood, UA negative negative   pH, UA 6.0 5.0 - 8.0   Protein Ur, POC negative negative mg/dL   Urobilinogen, UA 0.2 0.2 or 1.0 E.U./dL   Nitrite, UA Negative Negative    Leukocytes, UA Negative Negative  POCT Microscopic Urinalysis ([facility])  Result Value Ref Range   WBC,UR,HPF,POC None None WBC/hpf   RBC,UR,HPF,POC None None RBC/hpf   Bacteria None None, Too numerous to count   Mucus Absent Absent   Epithelial Cells, UR Per Microscopy Few (A) None, Too numerous to count cells/hpf    Dg Abd 2 Views  Result Date: 06/17/2017 CLINICAL DATA:  62 year old female with a history of lower abdominal pain EXAM: ABDOMEN - 2 VIEW COMPARISON:  None. FINDINGS: Gas within stomach, small bowel, colon. No abnormal distention. Surgical clips within the abdomen. No radiopaque foreign body. No unexpected soft tissue density or calcification. No displaced fracture IMPRESSION: Nonobstructive bowel gas pattern. Electronically Signed   By: Corrie Mckusick D.O.   On: 06/17/2017 12:08        Assessment & Plan:   1. Urinary frequency No evidence of infection on point-of-care urine specimens.  Reassuring abdominal films.  Await urine culture. - POCT urinalysis dipstick - POCT Microscopic Urinalysis ([facility]) - DG Abd 2 Views; Future - Urine Culture  2. Abdominal pain, left lower quadrant Reassuring abdominal films.  Possible diverticulitis. - DG Abd 2 Views; Future - amoxicillin-clavulanate (AUGMENTIN) 875-125 MG tablet; Take 1 tablet by mouth 3 (three) times daily for 10 days.  Dispense: 30 tablet; Refill: 0    Return if symptoms worsen or fail to improve.   Fara Chute, PA-C [facility] at [place]

## 2017-06-17 NOTE — Patient Instructions (Addendum)
Continue to stay hydrated. If you're not improving, we will likely need to get a CT scan.    IF you received an x-ray today, you will receive an invoice from [facility]. Please contact [facility] at [phone] with questions or concerns regarding your invoice.   IF you received labwork today, you will receive an invoice from [facility]. Please contact [facility] at [phone] with questions or concerns regarding your invoice.   Our billing staff will not be able to assist you with questions regarding bills from these companies.  You will be contacted with the lab results as soon as they are available. The fastest way to get your results is to activate your My Chart account. Instructions are located on the last page of this paperwork. If you have not heard from [place] regarding the results in 2 weeks, please contact this office.     Diverticulitis Diverticulitis is when small pockets in your large intestine (colon) get infected or swollen. This causes stomach pain and watery poop (diarrhea). These pouches are called diverticula. They form in people who have a condition called diverticulosis. Follow these instructions at home: Medicines  Take over-the-counter and prescription medicines only as told by your doctor. These include: ? Antibiotics. ? Pain medicines. ? Fiber pills. ? Probiotics. ? Stool softeners.  Do not drive or use heavy machinery while taking prescription pain medicine.  If you were prescribed an antibiotic, take it as told. Do not stop taking it even if you feel better. General instructions  Follow a diet as told by your doctor.  When you feel better, your doctor may tell you to change your diet. You may need to eat a lot of fiber. Fiber makes it easier to poop (have bowel movements). Healthy foods with fiber include: ? Berries. ? Beans. ? Lentils. ? Green vegetables.  Exercise 3 or more times a week. Aim for 30 minutes each time. Exercise enough  to sweat and make your heart beat faster.  Keep all follow-up visits as told. This is important. You may need to have an exam of the large intestine. This is called a colonoscopy. Contact a doctor if:  Your pain does not get better.  You have a hard time eating or drinking.  You are not pooping like normal. Get help right away if:  Your pain gets worse.  Your problems do not get better.  Your problems get worse very fast.  You have a fever.  You throw up (vomit) more than one time.  You have poop that is: ? Bloody. ? Black. ? Tarry. Summary  Diverticulitis is when small pockets in your large intestine (colon) get infected or swollen.  Take medicines only as told by your doctor.  Follow a diet as told by your doctor. This information is not intended to replace advice given to you by your health care provider. Make sure you discuss any questions you have with your health care provider. Document Released: 07/15/2007 Document Revised: 02/13/2016 Document Reviewed: 02/13/2016 Elsevier Interactive Patient Education  2017 [employer].

## 2017-06-17 NOTE — Telephone Encounter (Signed)
Gabrielle Castro calling for clarification on augmentin  875-125 mg tab one tab tid #30.  She wants to make sure this is what the PA wants as diverticulitis is usually treated with flagyl and cipro and concerns about diarrhea.  Spoke with Gabrielle Castro and augmentin is what she wants tic and she researched and new recommendations is augmentin and septra and she is aware of is normally used.  Gave confirmation to continue with ordered med per Gabrielle Castro.  Understanding verbalized by pharmacy Gabrielle Castro) and call ended. Dgaddy, CMA

## 2017-06-17 NOTE — Telephone Encounter (Signed)
Copied from [place]. Topic: General - Other >> Jun 16, 2017  2:11 PM Synthia Innocent wrote: CRM for notification.  06/16/17. Patient is following up on mychart message. Please advise. UTI issues

## 2017-06-18 LAB — URINE CULTURE

## 2017-06-25 MED FILL — QVAR REDIHALER 40 MCG/ACT A: 40 | 30 days supply | Qty: 11 | Fill #2

## 2017-06-30 LAB — COLOGUARD: COLOGUARD: NEGATIVE

## 2017-07-19 ENCOUNTER — Encounter: Payer: Self-pay | Admitting: Physician Assistant

## 2017-07-19 ENCOUNTER — Other Ambulatory Visit: Payer: Self-pay

## 2017-07-19 ENCOUNTER — Ambulatory Visit: Payer: 59 | Admitting: Physician Assistant

## 2017-07-19 VITALS — BP 110/80 | HR 92 | Temp 98.9°F | Resp 18 | Ht 63.98 in | Wt 190.2 lb

## 2017-07-19 DIAGNOSIS — R195 Other fecal abnormalities: Secondary | ICD-10-CM

## 2017-07-19 DIAGNOSIS — K602 Anal fissure, unspecified: Secondary | ICD-10-CM

## 2017-07-19 LAB — POCT CBC
Granulocyte percent: 68.4 %G (ref 37–80)
HCT, POC: 44.2 % (ref 37.7–47.9)
HEMOGLOBIN: 14.5 g/dL (ref 12.2–16.2)
LYMPH, POC: 2.2 (ref 0.6–3.4)
MCH: 29.3 pg (ref 27–31.2)
MCHC: 32.7 g/dL (ref 31.8–35.4)
MCV: 89.7 fL (ref 80–97)
MID (cbc): 0.4 (ref 0–0.9)
MPV: 7.2 fL (ref 0–99.8)
PLATELET COUNT, POC: 283 10*3/uL (ref 142–424)
POC Granulocyte: 5.8 (ref 2–6.9)
POC LYMPH PERCENT: 26.4 %L (ref 10–50)
POC MID %: 5.2 % (ref 0–12)
RBC: 4.93 M/uL (ref 4.04–5.48)
RDW, POC: 14.8 %
WBC: 8.5 10*3/uL (ref 4.6–10.2)

## 2017-07-19 MED ORDER — DILTIAZEM GEL 2 %: CUTANEOUS | 0 refills | Status: DC

## 2017-07-19 MED ORDER — LIDOCAINE (ANORECTAL) 5 % EX GEL: CUTANEOUS | 0 refills | Status: DC

## 2017-07-19 MED FILL — LIDOCAINE 5 % OINT: 5 | 30 days supply | Qty: 35 | Fill #0

## 2017-07-19 NOTE — Patient Instructions (Addendum)
You are likely experiencing gastroenteritis. You have an anal fissure (which is a tear) so that is likely causing the blood you are seeing. I have given you a prescriptions to use topically three times a day to help the tear heal. For upset stomach, I recommend BRAT diet (banana, rice, apple, toast) and advance diet as tolerated. Make sure you are drinking at least 64 oz of water a day. Return your stool sample as soon as you can so we can test for bacteria. If any of your symptoms worsen or you develop new concerning symptoms, please return.   Anal Fissure, Adult An anal fissure is a small tear or crack in the skin around the opening of the butt (anus).Bleeding from the tear or crack usually stops on its own within a few minutes. The bleeding may happen every time you poop (have a bowel movement) until the tear or crack heals. Follow these instructions at home: Eating and drinking  Avoid bananas and dairy products. These foods can make it hard to poop.  Drink enough fluid to keep your pee (urine) clear or pale yellow.  Eat a lot of fruit, whole grains, and vegetables. General instructions  Keep the butt area as clean and dry as you can.  Take a warm water bath (sitz bath) as told by your doctor. Do not use soap.  Take over-the-counter and prescription medicines only as told by your doctor.  Use creams or ointments only as told by your doctor.  Keep all follow-up visits as told by your doctor. This is important. Contact a doctor if:  You have more bleeding.  You have a fever.  You have watery poop (diarrhea) that is mixed with blood.  You have pain.  You problem gets worse, not better. This information is not intended to replace advice given to you by your health care provider. Make sure you discuss any questions you have with your health care provider. Document Released: 09/24/2010 Document Revised: 07/04/2015 Document Reviewed: 04/23/2014 Elsevier Interactive Patient Education   2018 [employer] Choices to Help Relieve Diarrhea, Adult When you have diarrhea, the foods you eat and your eating habits are very important. Choosing the right foods and drinks can help:  Relieve diarrhea.  Replace lost fluids and nutrients.  Prevent dehydration.  What general guidelines should I follow? Relieving diarrhea  Choose foods with less than 2 g or .07 oz. of fiber per serving.  Limit fats to less than 8 tsp (38 g or 1.34 oz.) a day.  Avoid the following: ? Foods and beverages sweetened with high-fructose corn syrup, honey, or sugar alcohols such as xylitol, sorbitol, and mannitol. ? Foods that contain a lot of fat or sugar. ? Fried, greasy, or spicy foods. ? High-fiber grains, breads, and cereals. ? Raw fruits and vegetables.  Eat foods that are rich in probiotics. These foods include dairy products such as yogurt and fermented milk products. They help increase healthy bacteria in the stomach and intestines (gastrointestinal tract, or GI tract).  If you have lactose intolerance, avoid dairy products. These may make your diarrhea worse.  Take medicine to help stop diarrhea (antidiarrheal medicine) only as told by your health care provider. Replacing nutrients  Eat small meals or snacks every 3-4 hours.  Eat bland foods, such as white rice, toast, or baked potato, until your diarrhea starts to get better. Gradually reintroduce nutrient-rich foods as tolerated or as told by your health care provider. This includes: ? Well-cooked  protein foods. ? Peeled, seeded, and soft-cooked fruits and vegetables. ? Low-fat dairy products.  Take vitamin and mineral supplements as told by your health care provider. Preventing dehydration   Start by sipping water or a special solution to prevent dehydration (oral rehydration solution, ORS). Urine that is clear or pale yellow means that you are getting enough fluid.  Try to drink at least 8-10 cups of fluid each day  to help replace lost fluids.  You may add other liquids in addition to water, such as clear juice or decaffeinated sports drinks, as tolerated or as told by your health care provider.  Avoid drinks with caffeine, such as coffee, tea, or soft drinks.  Avoid alcohol. What foods are recommended? The items listed may not be a complete list. Talk with your health care provider about what dietary choices are best for you. Grains White rice. White, [place], or pita breads (fresh or toasted), including plain rolls, buns, or bagels. White pasta. Saltine, soda, or graham crackers. Pretzels. Low-fiber cereal. Cooked cereals made with water (such as cornmeal, farina, or cream cereals). Plain muffins. Matzo. Melba toast. Zwieback. Vegetables Potatoes (without the skin). Most well-cooked and canned vegetables without skins or seeds. Tender lettuce. Fruits Apple sauce. Fruits canned in juice. Cooked apricots, cherries, grapefruit, peaches, pears, or plums. Fresh bananas and cantaloupe. Meats and other protein foods Baked or boiled chicken. Eggs. Tofu. Fish. Seafood. Smooth nut butters. Ground or well-cooked tender beef, ham, veal, lamb, pork, or poultry. Dairy Plain yogurt, kefir, and unsweetened liquid yogurt. Lactose-free milk, buttermilk, skim milk, or soy milk. Low-fat or nonfat hard cheese. Beverages Water. Low-calorie sports drinks. Fruit juices without pulp. Strained tomato and vegetable juices. Decaffeinated teas. Sugar-free beverages not sweetened with sugar alcohols. Oral rehydration solutions, if approved by your health care provider. Seasoning and other foods Bouillon, broth, or soups made from recommended foods. What foods are not recommended? The items listed may not be a complete list. Talk with your health care provider about what dietary choices are best for you. Grains Whole grain, whole wheat, bran, or rye breads, rolls, pastas, and crackers. Wild or brown rice. Whole grain or bran  cereals. Barley. Oats and oatmeal. Corn tortillas or taco shells. Granola. Popcorn. Vegetables Raw vegetables. Fried vegetables. Cabbage, broccoli, Brussels sprouts, artichokes, baked beans, beet greens, corn, kale, legumes, peas, sweet potatoes, and yams. Potato skins. Cooked spinach and cabbage. Fruits Dried fruit, including raisins and dates. Raw fruits. Stewed or dried prunes. Canned fruits with syrup. Meat and other protein foods Fried or fatty meats. Deli meats. Chunky nut butters. Nuts and seeds. Beans and lentils. Berniece Salines. Hot dogs. Sausage. Dairy High-fat cheeses. Whole milk, chocolate milk, and beverages made with milk, such as milk shakes. Half-and-half. Cream. sour cream. Ice cream. Beverages Caffeinated beverages (such as coffee, tea, soda, or energy drinks). Alcoholic beverages. Fruit juices with pulp. Prune juice. Soft drinks sweetened with high-fructose corn syrup or sugar alcohols. High-calorie sports drinks. Fats and oils Butter. Cream sauces. Margarine. Salad oils. Plain salad dressings. Olives. Avocados. Mayonnaise. Sweets and desserts Sweet rolls, doughnuts, and sweet breads. Sugar-free desserts sweetened with sugar alcohols such as xylitol and sorbitol. Seasoning and other foods Honey. Hot sauce. Chili powder. Gravy. Cream-based or milk-based soups. Pancakes and waffles. Summary  When you have diarrhea, the foods you eat and your eating habits are very important.  Make sure you get at least 8-10 cups of fluid each day, or enough to keep your urine clear or pale yellow.  Eat  bland foods and gradually reintroduce healthy, nutrient-rich foods as tolerated, or as told by your health care provider.  Avoid high-fiber, fried, greasy, or spicy foods. This information is not intended to replace advice given to you by your health care provider. Make sure you discuss any questions you have with your health care provider. Document Released: 04/18/2003 Document Revised: 01/24/2016  Document Reviewed: 01/24/2016 Elsevier Interactive Patient Education  2018 [employer].  IF you received an x-ray today, you will receive an invoice from [facility]. Please contact [facility] at [phone] with questions or concerns regarding your invoice.   IF you received labwork today, you will receive an invoice from [place]. Please contact [facility] at [phone] with questions or concerns regarding your invoice.   Our billing staff will not be able to assist you with questions regarding bills from these companies.  You will be contacted with the lab results as soon as they are available. The fastest way to get your results is to activate your My Chart account. Instructions are located on the last page of this paperwork. If you have not heard from [place] regarding the results in 2 weeks, please contact this office.

## 2017-07-19 NOTE — Progress Notes (Signed)
Gabrielle Castro  MRN: [id number] DOB: January 11, 1956  Subjective:  Gabrielle Castro is a 62 y.o. female seen in office today for a chief complaint of loose stools x 3 days. Has had 10-15 stools per day, they have decreased in frequency since starting. Not runny, not hard, somewhere in between. Had some abdominal cramping when having BM. + mucopurulent stools with small amount of bright red blood for the past two days.  +burning/stinging sensation in anal area when wiping. Denies fever, diaphoresis, nausea, vomiting, abdominal pain, melena, constipation, dizziness, heartburn, belching, and gas.  Notes she did eat catered tacos before this happened. Husband had the tacos too and just had heartburn. Has not eaten anything today. She is hungry but didn't want to eat due to visit. She is drinking at least 40 of water a day. Typically has one BM per day. Denies recent travel out of the country. No anticoagulation use. Former smoker, quit 1.5 years ago. Finished course of Augmentin about 20 days ago. No increased stools on abx. Had cologuard on 06/30/17, which was normal. Has PMH of diverticulitis and hemorrhoids. No PMH of Crohn's disease or UC.   Review of Systems  Per HPI  Patient Active Problem List   Diagnosis Date Noted  . BMI 32.0-32.9,adult 08/07/2016  . Single kidney 07/03/2016  . Elevated serum creatinine 07/02/2016  . History of abnormal cervical Pap smear 06/30/2016  . COPD GOLD II/III  03/25/2016    Current Outpatient Medications on File Prior to Visit  Medication Sig Dispense Refill  . beclomethasone (QVAR) 40 MCG/ACT inhaler Inhale 2 puffs into the lungs 2 (two) times daily. 10.6 g 12  . Biotin 10000 MCG TABS biotin    . LYSINE PO lysine    . Multiple Vitamins-Minerals (CENTRUM SILVER 50+WOMEN) TABS Centrum Silver    . SUPER B COMPLEX/C PO Super B Complex + C    . albuterol (PROVENTIL HFA;VENTOLIN HFA) 108 (90 Base) MCG/ACT inhaler Inhale 2 puffs into the lungs every 4 (four) hours as  needed for wheezing or shortness of breath (cough, shortness of breath or wheezing.). (Patient not taking: Reported on 06/17/2017) 1 Inhaler 1   No current facility-administered medications on file prior to visit.     Allergies  Allergen Reactions  . Nickel Swelling, Hives and Itching  . Tape Rash      Social History   Socioeconomic History  . Marital status: Married    Spouse name: Marden Noble  . Number of children: 4  . Years of education: Not on file  . Highest education level: Associate degree: academic program  Occupational History  . Occupation: Teacher, early years/pre: [employer]  . Financial resource strain: Not on file  . Food insecurity:    Worry: Not on file    Inability: Not on file  . Transportation needs:    Medical: Not on file    Non-medical: Not on file  Tobacco Use  . Smoking status: Former Smoker    Packs/day: 1.00    Years: 41.00    Pack years: 41.00    Types: Cigarettes    Last attempt to quit: 03/03/2016    Years since quitting: 1.3  . Smokeless tobacco: Never Used  Substance and Sexual Activity  . Alcohol use: Yes  . Drug use: Never  . Sexual activity: Not Currently    Comment: since husband's stroke in 2014  Lifestyle  . Physical activity:    Days per week: Not on file  Minutes per session: Not on file  . Stress: Not on file  Relationships  . Social connections:    Talks on phone: Not on file    Gets together: Not on file    Attends religious service: Not on file    Active member of club or organization: Not on file    Attends meetings of clubs or organizations: Not on file    Relationship status: Not on file  . Intimate partner violence:    Fear of current or ex partner: Not on file    Emotionally abused: Not on file    Physically abused: Not on file    Forced sexual activity: Not on file  Other Topics Concern  . Not on file  Social History Narrative   Lives with her husband, who is disabled due to multiple  cardiovascular diagnoses, and her elderly mother.   Her mother is a "pack rat." Her husband doesn't like clutter, and makes snide comments about it.   One daughter lives [place].   Other 2 daughters and son live near [place], [place].     Objective:  BP 110/80   Pulse 92   Temp 98.9 F (37.2 C) (Oral)   Resp 18   Ht 5' 3.98" (1.625 m)   Wt 190 lb 3.2 oz (86.3 kg)   SpO2 97%   BMI 32.67 kg/m   Physical Exam  Constitutional: She is oriented to person, place, and time. She appears well-developed and well-nourished. No distress.  HENT:  Head: Normocephalic and atraumatic.  Eyes: Conjunctivae are normal.  Neck: Normal range of motion.  Pulmonary/Chest: Effort normal.  Abdominal: Soft. Normal appearance and bowel sounds are normal. There is no tenderness. There is no rigidity, no guarding, no tenderness at McBurney's point and negative Murphy's sign.  Genitourinary: Rectal exam shows fissure (at 6 o clock with overlying bright red blood noted ). Rectal exam shows no external hemorrhoid, no internal hemorrhoid and no mass.  Neurological: She is alert and oriented to person, place, and time.  Skin: Skin is warm and dry.  Psychiatric: She has a normal mood and affect.  Vitals reviewed.  Results for orders placed or performed in visit on 07/19/17 (from the past 24 hour(s))  POCT CBC     Status: None   Collection Time: 07/19/17  3:35 PM  Result Value Ref Range   WBC 8.5 4.6 - 10.2 K/uL   Lymph, poc 2.2 0.6 - 3.4   POC LYMPH PERCENT 26.4 10 - 50 %L   MID (cbc) 0.4 0 - 0.9   POC MID % 5.2 0 - 12 %M   POC Granulocyte 5.8 2 - 6.9   Granulocyte percent 68.4 37 - 80 %G   RBC 4.93 4.04 - 5.48 M/uL   Hemoglobin 14.5 12.2 - 16.2 g/dL   HCT, POC 44.2 37.7 - 47.9 %   MCV 89.7 80 - 97 fL   MCH, POC 29.3 27 - 31.2 pg   MCHC 32.7 31.8 - 35.4 g/dL   RDW, POC 14.8 %   Platelet Count, POC 283 142 - 424 K/uL   MPV 7.2 0 - 99.8 fL    Assessment and Plan :  1. Loose stools 2. Mucous in  stools Suspect gastroenteritis. Pt is overall well appearing, no distress. Vitals stable. WBC wnl. Hgb normal. No abdominal pain noted on exam. Recommend BRAT diet and advance diet as tolerated. GI profile pending due to pt's description of stools.  - POCT CBC - GI  Profile, Stool, PCR  3. Anal fissure Anal fissure with overall bright red blood noted on exam. Otherwise, normal rectum exam. Recommend Rx of dilt gel and lidocaine gel. Advised to return to clinic if symptoms worsen, do not improve, or as needed.  - diltiazem 2 % GEL; Apply a pea-sized amount to the affected area 3 times daily.  Dispense: 30 g; Refill: 0 - Lidocaine, Anorectal, 5 % GEL; Apply a pea-sized amount to the affected area 3 times daily.  Dispense: 30 g; Refill: 0  Side effects, risks, benefits, and alternatives of the medications and treatment plan prescribed today were discussed, and patient expressed understanding of the instructions given. No barriers to understanding were identified. Red flags discussed in detail. Pt expressed understanding regarding what to do in case of emergency/urgent symptoms.   Tenna Delaine PA-C  [facility] 07/19/2017 3:46 PM

## 2017-07-20 ENCOUNTER — Telehealth: Payer: Self-pay

## 2017-07-20 ENCOUNTER — Encounter: Payer: Self-pay | Admitting: Physician Assistant

## 2017-07-20 NOTE — Telephone Encounter (Signed)
Copied from [place] [phone]. Topic: Quick Communication - See Telephone Encounter >> Jul 19, 2017  4:47 PM Antonieta Iba C wrote: CRM for notification. See Telephone encounter for: 07/19/17.  Joelene Millin with pharmacy called in to be advised about Lidocaine, Anorectal, 5 % GEL, she said that they do not have in a GEL but have medication in an ointment. She would like to know if it is okay to change?

## 2017-07-20 NOTE — Telephone Encounter (Signed)
Medication already changed.

## 2017-07-27 MED FILL — QVAR REDIHALER 40 MCG/ACT A: 40 | 30 days supply | Qty: 11 | Fill #3

## 2017-08-05 ENCOUNTER — Encounter (HOSPITAL_COMMUNITY): Payer: Self-pay | Admitting: Family Medicine

## 2017-08-05 ENCOUNTER — Ambulatory Visit (INDEPENDENT_AMBULATORY_CARE_PROVIDER_SITE_OTHER): Payer: 59

## 2017-08-05 ENCOUNTER — Ambulatory Visit (HOSPITAL_COMMUNITY)
Admission: EM | Admit: 2017-08-05 | Discharge: 2017-08-05 | Disposition: A | Discharge status: Home / Self Care | Payer: 59 | Attending: Family Medicine | Admitting: Family Medicine

## 2017-08-05 ENCOUNTER — Other Ambulatory Visit: Payer: Self-pay

## 2017-08-05 DIAGNOSIS — S20211A Contusion of right front wall of thorax, initial encounter: Secondary | ICD-10-CM

## 2017-08-05 DIAGNOSIS — R0781 Pleurodynia: Secondary | ICD-10-CM | POA: Diagnosis not present

## 2017-08-05 MED ORDER — TRAMADOL HCL 50 MG PO TABS: 50.0000 mg | ORAL_TABLET | Freq: Four times a day (QID) | ORAL | 0 refills | Status: DC | PRN

## 2017-08-05 MED ORDER — PREDNISONE 20 MG PO TABS: ORAL_TABLET | ORAL | 0 refills | Status: DC

## 2017-08-05 MED FILL — predniSONE 20 MG TABS: 20 | 3 days supply | Qty: 6 | Fill #0

## 2017-08-05 MED FILL — traMADol HCL 50 MG TABS: 50 | 3 days supply | Qty: 10 | Fill #0

## 2017-08-05 NOTE — ED Provider Notes (Signed)
[place]   [id number] 08/05/17 Arrival Time: 1608   SUBJECTIVE:  Gabrielle Castro is a 62 y.o. female who presents to the urgent care with complaint of  Lower right rib cage pain.  Patient has used heating -pads and muscle relaxer.  Movement and breathing causes pain to increase.   Patient was in kayak class and slipped, contusing her right ribs.  Patient fell two days ago and pain is worsening, particularly with deep breath.  Location of pain is below right breast in midclavicular line.  Past Medical History:  Diagnosis Date  . COPD (chronic obstructive pulmonary disease) ([place])   . EP (ectopic pregnancy)    Family History  Problem Relation Age of Onset  . Lung cancer Father        smoked  . Cancer Father        lung; +TOB  . Cancer Paternal Grandmother        "every kind of cancer"  . Heart disease Mother   . Diabetes Mother    Social History   Socioeconomic History  . Marital status: Married    Spouse name: Marden Noble  . Number of children: 4  . Years of education: Not on file  . Highest education level: Associate degree: academic program  Occupational History  . Occupation: Teacher, early years/pre: [employer]  . Financial resource strain: Not on file  . Food insecurity:    Worry: Not on file    Inability: Not on file  . Transportation needs:    Medical: Not on file    Non-medical: Not on file  Tobacco Use  . Smoking status: Former Smoker    Packs/day: 1.00    Years: 41.00    Pack years: 41.00    Types: Cigarettes    Last attempt to quit: 03/03/2016    Years since quitting: 1.4  . Smokeless tobacco: Never Used  Substance and Sexual Activity  . Alcohol use: Yes  . Drug use: Never  . Sexual activity: Not Currently    Comment: since husband's stroke in 2014  Lifestyle  . Physical activity:    Days per week: Not on file    Minutes per session: Not on file  . Stress: Not on file  Relationships  . Social connections:    Talks  on phone: Not on file    Gets together: Not on file    Attends religious service: Not on file    Active member of club or organization: Not on file    Attends meetings of clubs or organizations: Not on file    Relationship status: Not on file  . Intimate partner violence:    Fear of current or ex partner: Not on file    Emotionally abused: Not on file    Physically abused: Not on file    Forced sexual activity: Not on file  Other Topics Concern  . Not on file  Social History Narrative   Lives with her husband, who is disabled due to multiple cardiovascular diagnoses, and her elderly mother.   Her mother is a "pack rat." Her husband doesn't like clutter, and makes snide comments about it.   One daughter lives ne[place].   Other 2 daughters and son live near [place], [place].    Current Meds  Medication Sig  . beclomethasone (QVAR) 40 MCG/ACT inhaler Inhale 2 puffs into the lungs 2 (two) times daily.   Allergies  Allergen Reactions  . Nickel Swelling, Hives  and Itching  . Tape Rash      ROS: As per HPI, remainder of ROS negative.   OBJECTIVE:   Vitals:   08/05/17 1623  BP: (!) 146/88  Pulse: 75  Resp: 16  Temp: 98.8 F (37.1 C)  TempSrc: Oral  SpO2: 99%     General appearance: alert; no distress Eyes: PERRL; EOMI; conjunctiva normal HENT: normocephalic; atraumatic; oral mucosa normal Neck: supple Lungs: clear to auscultation bilaterally but patient unable to take deep breath; Tender right lower ribs in mid clavicular line. Heart: regular rate and rhythm Abdomen: soft, non-tender; bowel sounds normal; no masses or organomegaly; no guarding or rebound tenderness Back: no CVA tenderness Extremities: no cyanosis or edema; symmetrical with no gross deformities Skin: warm and dry; faint ecchymosis over lower anterior right rib cage. Neurologic: normal gait; grossly normal Psychological: alert and cooperative; normal mood and affect      Labs:  Results for  orders placed or performed in visit on 07/19/17  POCT CBC  Result Value Ref Range   WBC 8.5 4.6 - 10.2 K/uL   Lymph, poc 2.2 0.6 - 3.4   POC LYMPH PERCENT 26.4 10 - 50 %L   MID (cbc) 0.4 0 - 0.9   POC MID % 5.2 0 - 12 %M   POC Granulocyte 5.8 2 - 6.9   Granulocyte percent 68.4 37 - 80 %G   RBC 4.93 4.04 - 5.48 M/uL   Hemoglobin 14.5 12.2 - 16.2 g/dL   HCT, POC 44.2 37.7 - 47.9 %   MCV 89.7 80 - 97 fL   MCH, POC 29.3 27 - 31.2 pg   MCHC 32.7 31.8 - 35.4 g/dL   RDW, POC 14.8 %   Platelet Count, POC 283 142 - 424 K/uL   MPV 7.2 0 - 99.8 fL    Labs Reviewed - No data to display  Dg Ribs Unilateral W/chest Right  Result Date: 08/05/2017 CLINICAL DATA:  Impacted rt anterior lower ribs on a hard plastic surface of a kayak x 2 days ago. No previous injury to area. Hx of COPD, ex smoker 1.5 years ago. EXAM: RIGHT RIBS AND CHEST - 3+ VIEW COMPARISON:  None. FINDINGS: No rib fracture or rib lesion. Lungs show a relative upper lobe lucency and prominent bronchovascular markings in the lower lungs, findings consistent with emphysema. No acute findings in the lungs. No pleural effusion or pneumothorax. Cardiac silhouette is normal in size. Normal mediastinal and hilar contours IMPRESSION: 1. No rib fracture or rib lesion. 2. No acute cardiopulmonary disease.  COPD. Electronically Signed   By: Lajean Manes M.D.   On: 08/05/2017 17:12       ASSESSMENT & PLAN:  1. Contusion of right chest wall, initial encounter     Meds ordered this encounter  Medications  . predniSONE (DELTASONE) 20 MG tablet    Sig: Two daily with food    Dispense:  6 tablet    Refill:  0  . traMADol (ULTRAM) 50 MG tablet    Sig: Take 1 tablet (50 mg total) by mouth every 6 (six) hours as needed.    Dispense:  10 tablet    Refill:  0    Reviewed expectations re: course of current medical issues. Questions answered. Outlined signs and symptoms indicating need for more acute intervention. Patient verbalized  understanding. After Visit Summary given.    Procedures:      Robyn Haber, MD 08/05/17 1721

## 2017-08-05 NOTE — ED Triage Notes (Signed)
Patient had a kayaking class.  Slipped and fell into kayak.  Lower right rib cage pain.  Patient has used heating -pads and muscle relaxer.  Movement and breathing causes pain to increase.

## 2017-08-05 NOTE — Discharge Instructions (Addendum)
EXAM: RIGHT RIBS AND CHEST - 3+ VIEW   COMPARISON:  None.   FINDINGS: No rib fracture or rib lesion.   Lungs show a relative upper lobe lucency and prominent bronchovascular markings in the lower lungs, findings consistent with emphysema. No acute findings in the lungs. No pleural effusion or pneumothorax.   Cardiac silhouette is normal in size. Normal mediastinal and hilar contours   IMPRESSION: 1. No rib fracture or rib lesion. 2. No acute cardiopulmonary disease.  COPD.     Electronically Signed   By: Lajean Manes M.D.   On: 08/05/2017 17:12

## 2017-08-31 MED FILL — QVAR REDIHALER 40 MCG/ACT A: 40 | 30 days supply | Qty: 11 | Fill #4

## 2017-09-29 MED FILL — QVAR REDIHALER 40 MCG/ACT A: 40 | 30 days supply | Qty: 11 | Fill #5

## 2017-10-21 ENCOUNTER — Encounter: Payer: Self-pay | Admitting: Physician Assistant

## 2017-10-22 ENCOUNTER — Ambulatory Visit: Payer: Self-pay | Admitting: *Deleted
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
## 2017-10-23 ENCOUNTER — Encounter: Payer: Self-pay | Admitting: Family Medicine

## 2017-10-23 ENCOUNTER — Ambulatory Visit (INDEPENDENT_AMBULATORY_CARE_PROVIDER_SITE_OTHER): Payer: 59 | Admitting: Family Medicine

## 2017-10-23 ENCOUNTER — Other Ambulatory Visit: Payer: Self-pay

## 2017-10-23 VITALS — BP 124/83 | HR 74 | Temp 98.4°F | Resp 16 | Ht 64.57 in | Wt 192.4 lb

## 2017-10-23 DIAGNOSIS — F419 Anxiety disorder, unspecified: Secondary | ICD-10-CM | POA: Diagnosis not present

## 2017-10-23 DIAGNOSIS — F4329 Adjustment disorder with other symptoms: Secondary | ICD-10-CM

## 2017-10-23 MED ORDER — CLONAZEPAM 0.5 MG PO TABS: 0.5000 mg | ORAL_TABLET | Freq: Two times a day (BID) | ORAL | 0 refills | Status: DC | PRN
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
## 2017-10-23 NOTE — Patient Instructions (Addendum)
  Living With Anxiety After being diagnosed with an anxiety disorder, you may be relieved to know why you have felt or behaved a certain way. It is natural to also feel overwhelmed about the treatment ahead and what it will mean for your life. With care and support, you can manage this condition and recover from it. How to cope with anxiety Dealing with stress Stress is your body's reaction to life changes and events, both good and bad. Stress can last just a few hours or it can be ongoing. Stress can play a major role in anxiety, so it is important to learn both how to cope with stress and how to think about it differently. Talk with your health care provider or a counselor to learn more about stress reduction. He or she may suggest some stress reduction techniques, such as:  Music therapy. This can include creating or listening to music that you enjoy and that inspires you.  Mindfulness-based meditation. This involves being aware of your normal breaths, rather than trying to control your breathing. It can be done while sitting or walking.  Centering prayer. This is a kind of meditation that involves focusing on a word, phrase, or sacred image that is meaningful to you and that brings you peace.  Deep breathing. To do this, expand your stomach and inhale slowly through your nose. Hold your breath for 3-5 seconds. Then exhale slowly, allowing your stomach muscles to relax.  Self-talk. This is a skill where you identify thought patterns that lead to anxiety reactions and correct those thoughts.  Muscle relaxation. This involves tensing muscles then relaxing them.  Choose a stress reduction technique that fits your lifestyle and personality. Stress reduction techniques take time and practice. Set aside 5-15 minutes a day to do them. Therapists can offer training in these techniques. The training may be covered by some insurance plans. Other things you can do to manage stress include:  Keeping  a stress diary. This can help you learn what triggers your stress and ways to control your response.  Thinking about how you respond to certain situations. You may not be able to control everything, but you can control your reaction.  Making time for activities that help you relax, and not feeling guilty about spending your time in this way.  Therapy combined with coping and stress-reduction skills provides the best chance for successful treatment. Medicines Medicines can help ease symptoms. Medicines for anxiety include:  Anti-anxiety drugs.  Antidepressants.  Beta-blockers.  Medicines may be used as the main treatment for anxiety disorder, along with therapy, or if other treatments are not working. Medicines should be prescribed by a health care provider. Relationships Relationships can play a big part in helping you recover. Try to spend more time connecting with trusted friends and family members. Consider going to couples counseling, taking family education classes, or going to family therapy. Therapy can help you and others better understand the condition. How to recognize changes in your condition Everyone has a different response to treatment for anxiety. Recovery from anxiety happens when symptoms decrease and stop interfering with your daily activities at home or work. This may mean that you will start to:  Have better concentration and focus.  Sleep better.  Be less irritable.  Have more energy.  Have improved memory.  It is important to recognize when your condition is getting worse. Contact your health care provider if your symptoms interfere with home or work and you do not feel like   your condition is improving. Where to find help and support: You can get help and support from these sources:  Self-help groups.  Online and community organizations.  A trusted spiritual leader.  Couples counseling.  Family education classes.  Family therapy.  Follow these  instructions at home:  Eat a healthy diet that includes plenty of vegetables, fruits, whole grains, low-fat dairy products, and lean protein. Do not eat a lot of foods that are high in solid fats, added sugars, or salt.  Exercise. Most adults should do the following: ? Exercise for at least 150 minutes each week. The exercise should increase your heart rate and make you sweat (moderate-intensity exercise). ? Strengthening exercises at least twice a week.  Cut down on caffeine, tobacco, alcohol, and other potentially harmful substances.  Get the right amount and quality of sleep. Most adults need 7-9 hours of sleep each night.  Make choices that simplify your life.  Take over-the-counter and prescription medicines only as told by your health care provider.  Avoid caffeine, alcohol, and certain over-the-counter cold medicines. These may make you feel worse. Ask your pharmacist which medicines to avoid.  Keep all follow-up visits as told by your health care provider. This is important. Questions to ask your health care provider  Would I benefit from therapy?  How often should I follow up with a health care provider?  How long do I need to take medicine?  Are there any long-term side effects of my medicine?  Are there any alternatives to taking medicine? Contact a health care provider if:  You have a hard time staying focused or finishing daily tasks.  You spend many hours a day feeling worried about everyday life.  You become exhausted by worry.  You start to have headaches, feel tense, or have nausea.  You urinate more than normal.  You have diarrhea. Get help right away if:  You have a racing heart and shortness of breath.  You have thoughts of hurting yourself or others. If you ever feel like you may hurt yourself or others, or have thoughts about taking your own life, get help right away. You can go to your nearest emergency department or call:  Your local emergency  services (911 in the [place]  A suicide crisis helpline, such as the National Suicide Prevention Lifeline at 1-800-273-8255. This is open 24-hours a day.  Summary  Taking steps to deal with stress can help calm you.  Medicines cannot cure anxiety disorders, but they can help ease symptoms.  Family, friends, and partners can play a big part in helping you recover from an anxiety disorder. This information is not intended to replace advice given to you by your health care provider. Make sure you discuss any questions you have with your health care provider. Document Released: 01/21/2016 Document Revised: 01/21/2016 Document Reviewed: 01/21/2016 Elsevier Interactive Patient Education  2018 [employer]   If you have lab work done today you will be contacted with your lab results within the next 2 weeks.  If you have not heard from us then please contact us. The fastest way to get your results is to register for My Chart.   IF you received an x-ray today, you will receive an invoice from [facility]. Please contact [facility] at [phone] with questions or concerns regarding your invoice.   IF you received labwork today, you will receive an invoice from [facility]. Please contact [facility] at [phone] with questions or concerns regarding your invoice.     billing staff will not be able to assist you with questions regarding bills from these companies.  You will be contacted with the lab results as soon as they are available. The fastest way to get your results is to activate your My Chart account. Instructions are located on the last page of this paperwork. If you have not heard from us regarding the results in 2 weeks, please contact this office.      

## 2017-10-26 NOTE — Telephone Encounter (Signed)
FYI patient did see Lavell Anchors 10/23/2017

## 2017-10-29 MED FILL — QVAR REDIHALER 40 MCG/ACT A: 40 | 30 days supply | Qty: 11 | Fill #6

## 2017-11-08 ENCOUNTER — Encounter: Payer: Self-pay | Admitting: Family Medicine

## 2017-11-16 ENCOUNTER — Other Ambulatory Visit: Payer: Self-pay

## 2017-11-16 ENCOUNTER — Ambulatory Visit: Payer: 59 | Admitting: Family Medicine

## 2017-11-16 ENCOUNTER — Encounter: Payer: Self-pay | Admitting: Family Medicine

## 2017-11-16 VITALS — BP 122/62 | HR 77 | Temp 98.3°F | Ht 64.57 in | Wt 196.4 lb

## 2017-11-16 DIAGNOSIS — F419 Anxiety disorder, unspecified: Secondary | ICD-10-CM | POA: Diagnosis not present

## 2017-11-16 DIAGNOSIS — F4329 Adjustment disorder with other symptoms: Secondary | ICD-10-CM | POA: Diagnosis not present

## 2017-11-16 NOTE — Patient Instructions (Addendum)
For therapy -- [facility] ([address] [place]) [place] [facility] [address] [place]) - [facility] - [phone] [facility] [place] - [phone] [place], [phone] [facility]  - [phone] (do not file insurance) Florentina Jenny [phone] [facility] - [place], [phone] [facility] - [phone]  Nutritionist Megan [facility] Almyra Free [facility]    If you have lab work done today you will be contacted with your lab results within the next 2 weeks.  If you have not heard from [place] then please contact us. The fastest way to get your results is to register for My Chart.   IF you received an x-ray today, you will receive an invoice from [facility]. Please contact [facility] at [phone] with questions or concerns regarding your invoice.   IF you received labwork today, you will receive an invoice from [facility]. Please contact [facility] at [phone] with questions or concerns regarding your invoice.   Our billing staff will not be able to assist you with questions regarding bills from these companies.  You will be contacted with the lab results as soon as they are available. The fastest way to get your results is to activate your My Chart account. Instructions are located on the last page of this paperwork. If you have not heard from [place] regarding the results in 2 weeks, please contact this office.
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
## 2017-11-17 ENCOUNTER — Encounter: Payer: Self-pay | Admitting: Family Medicine

## 2017-11-18 ENCOUNTER — Encounter (INDEPENDENT_AMBULATORY_CARE_PROVIDER_SITE_OTHER): Payer: 59

## 2017-11-30 MED FILL — QVAR REDIHALER 40 MCG/ACT A: 40 | 30 days supply | Qty: 11 | Fill #7

## 2017-12-02 ENCOUNTER — Encounter (INDEPENDENT_AMBULATORY_CARE_PROVIDER_SITE_OTHER): Payer: Self-pay | Admitting: Family Medicine

## 2017-12-02 ENCOUNTER — Ambulatory Visit (INDEPENDENT_AMBULATORY_CARE_PROVIDER_SITE_OTHER): Payer: 59 | Admitting: Family Medicine

## 2017-12-02 VITALS — BP 117/84 | HR 63 | Ht 63.0 in | Wt 190.0 lb

## 2017-12-02 DIAGNOSIS — Z0289 Encounter for other administrative examinations: Secondary | ICD-10-CM

## 2017-12-02 DIAGNOSIS — Z1331 Encounter for screening for depression: Secondary | ICD-10-CM

## 2017-12-02 DIAGNOSIS — Z6833 Body mass index (BMI) 33.0-33.9, adult: Secondary | ICD-10-CM | POA: Diagnosis not present

## 2017-12-02 DIAGNOSIS — J449 Chronic obstructive pulmonary disease, unspecified: Secondary | ICD-10-CM

## 2017-12-02 DIAGNOSIS — E669 Obesity, unspecified: Secondary | ICD-10-CM

## 2017-12-02 DIAGNOSIS — Z9189 Other specified personal risk factors, not elsewhere classified: Secondary | ICD-10-CM | POA: Diagnosis not present

## 2017-12-02 DIAGNOSIS — R5383 Other fatigue: Secondary | ICD-10-CM | POA: Diagnosis not present

## 2017-12-03 LAB — CBC WITH DIFFERENTIAL
Basophils Absolute: 0.1 10*3/uL (ref 0.0–0.2)
Basos: 1 %
EOS (ABSOLUTE): 0.3 10*3/uL (ref 0.0–0.4)
Eos: 4 %
HEMOGLOBIN: 13.9 g/dL (ref 11.1–15.9)
Hematocrit: 42.5 % (ref 34.0–46.6)
IMMATURE GRANS (ABS): 0 10*3/uL (ref 0.0–0.1)
IMMATURE GRANULOCYTES: 0 %
LYMPHS ABS: 1.8 10*3/uL (ref 0.7–3.1)
Lymphs: 28 %
MCH: 30 pg (ref 26.6–33.0)
MCHC: 32.7 g/dL (ref 31.5–35.7)
MCV: 92 fL (ref 79–97)
MONOS ABS: 0.4 10*3/uL (ref 0.1–0.9)
Monocytes: 6 %
Neutrophils Absolute: 3.9 10*3/uL (ref 1.4–7.0)
Neutrophils: 61 %
RBC: 4.63 x10E6/uL (ref 3.77–5.28)
RDW: 13.2 % (ref 12.3–15.4)
WBC: 6.3 10*3/uL (ref 3.4–10.8)

## 2017-12-03 LAB — COMPREHENSIVE METABOLIC PANEL
ALBUMIN: 4.6 g/dL (ref 3.6–4.8)
ALK PHOS: 74 IU/L (ref 39–117)
ALT: 20 IU/L (ref 0–32)
AST: 19 IU/L (ref 0–40)
Albumin/Globulin Ratio: 2 (ref 1.2–2.2)
BUN / CREAT RATIO: 16 (ref 12–28)
BUN: 16 mg/dL (ref 8–27)
Bilirubin Total: 0.4 mg/dL (ref 0.0–1.2)
CHLORIDE: 99 mmol/L (ref 96–106)
CO2: 25 mmol/L (ref 20–29)
Calcium: 9.7 mg/dL (ref 8.7–10.3)
Creatinine, Ser: 1 mg/dL (ref 0.57–1.00)
GFR calc non Af Amer: 61 mL/min/{1.73_m2} (ref >59)
GFR, EST AFRICAN AMERICAN: 70 mL/min/{1.73_m2} (ref >59)
GLUCOSE: 94 mg/dL (ref 65–99)
Globulin, Total: 2.3 g/dL (ref 1.5–4.5)
Potassium: 4.7 mmol/L (ref 3.5–5.2)
Sodium: 140 mmol/L (ref 134–144)
TOTAL PROTEIN: 6.9 g/dL (ref 6.0–8.5)

## 2017-12-03 LAB — VITAMIN B12: VITAMIN B 12: 1083 pg/mL (ref 232–1245)

## 2017-12-03 LAB — LIPID PANEL WITH LDL/HDL RATIO
CHOLESTEROL TOTAL: 240 mg/dL — AB (ref 100–199)
HDL: 88 mg/dL (ref >39)
LDL Calculated: 126 mg/dL — ABNORMAL HIGH (ref 0–99)
LDl/HDL Ratio: 1.4 ratio (ref 0.0–3.2)
Triglycerides: 132 mg/dL (ref 0–149)
VLDL CHOLESTEROL CAL: 26 mg/dL (ref 5–40)

## 2017-12-03 LAB — T4, FREE: FREE T4: 1.4 ng/dL (ref 0.82–1.77)

## 2017-12-03 LAB — TSH: TSH: 1.36 u[IU]/mL (ref 0.450–4.500)

## 2017-12-03 LAB — INSULIN, RANDOM: INSULIN: 8.2 u[IU]/mL (ref 2.6–24.9)

## 2017-12-03 LAB — T3: T3, Total: 126 ng/dL (ref 71–180)

## 2017-12-03 LAB — HEMOGLOBIN A1C
Est. average glucose Bld gHb Est-mCnc: 111 mg/dL
HEMOGLOBIN A1C: 5.5 % (ref 4.8–5.6)

## 2017-12-03 LAB — VITAMIN D 25 HYDROXY (VIT D DEFICIENCY, FRACTURES): VIT D 25 HYDROXY: 31.5 ng/mL (ref 30.0–100.0)

## 2017-12-03 LAB — FOLATE

## 2017-12-07 ENCOUNTER — Encounter (INDEPENDENT_AMBULATORY_CARE_PROVIDER_SITE_OTHER): Payer: Self-pay | Admitting: Family Medicine

## 2017-12-07 NOTE — Progress Notes (Signed)
Office: [phone]  /  Fax: [phone]   Dear Dr. Pamella Castro,   Thank you for referring Gabrielle Castro to our clinic. The following note includes my evaluation and treatment recommendations.  HPI:   Chief Complaint: OBESITY    Gabrielle Castro has been referred by Gabrielle Castro. Gabrielle Pert, MD for consultation regarding her obesity and obesity related comorbidities.    Gabrielle Castro (MR# [id number]) is a 62 y.o. female who presents on 12/02/2017 for obesity evaluation and treatment. Current BMI is Body mass index is 33.66 kg/m.Gabrielle Castro has been struggling with her weight for many years and has been unsuccessful in either losing weight, maintaining weight loss, or reaching her healthy weight goal.     Gabrielle Castro attended our information session and states she is currently in the action stage of change and ready to dedicate time achieving and maintaining a healthier weight. Gabrielle Castro is interested in becoming our patient and working on intensive lifestyle modifications including (but not limited to) diet, exercise and weight loss.    Gabrielle Castro states her family eats meals together she thinks her family will eat healthier with  her her desired weight loss is 60 lbs her heaviest weight ever was 190 lbs she is a picky eater and doesn't like to eat healthier foods  she has significant food cravings issues  she skips meals frequently she is frequently drinking liquids with calories she frequently makes poor food choices she frequently eats larger portions than normal    Gabrielle Castro feels her energy is lower than it should be. This has worsened with weight gain and has not worsened recently. Gabrielle Castro admits to daytime somnolence and  denies waking up still tired. Patient is at risk for obstructive sleep apnea. Gabrielle Castro has a history of symptoms of daytime Gabrielle. Patient generally gets 7 hours of sleep per night, and states they generally have generally restful sleep. Snoring is not present. Apneic  episodes are present. Epworth Sleepiness Score is 9.  Chronic Obstructive Pulmonary Disease Gabrielle Castro has COPD and she is on Qvar and albuterol as needed. She has a 62 year old history of smoking, and quit in 2017.  At risk for cardiovascular disease Gabrielle Castro is at a higher than average risk for cardiovascular disease due to obesity and COPD. She currently denies any chest pain.  Depression Screen Gabrielle Castro's Food and Mood (modified PHQ-9) score was  Depression screen PHQ 2/9 12/02/2017  Decreased Interest 2  Down, Depressed, Hopeless 1  PHQ - 2 Score 3  Altered sleeping 1  Tired, decreased energy 3  Change in appetite 1  Feeling bad or failure about yourself  2  Trouble concentrating 1  Moving slowly or fidgety/restless 2  Suicidal thoughts 0  PHQ-9 Score 13  Difficult doing work/chores Somewhat difficult    ALLERGIES: Allergies  Allergen Reactions  . Nickel Swelling, Hives and Itching  . Tape Rash    MEDICATIONS: Current Outpatient Medications on File Prior to Visit  Medication Sig Dispense Refill  . albuterol (PROVENTIL HFA;VENTOLIN HFA) 108 (90 Base) MCG/ACT inhaler Inhale 2 puffs into the lungs every 4 (four) hours as needed for wheezing or shortness of breath (cough, shortness of breath or wheezing.). 1 Inhaler 1  . beclomethasone (QVAR) 40 MCG/ACT inhaler Inhale 2 puffs into the lungs 2 (two) times daily. 10.6 g 12  . clonazePAM (KLONOPIN) 0.5 MG tablet Take 1 tablet (0.5 mg total) by mouth 2 (two) times daily as needed for anxiety. 30 tablet 0   No current facility-administered medications on file  prior to visit.     PAST MEDICAL HISTORY: Past Medical History:  Diagnosis Date  . Anxiety   . COPD (chronic obstructive pulmonary disease) ([place])   . Dyspnea   . EP (ectopic pregnancy)   . GERD (gastroesophageal reflux disease)     PAST SURGICAL HISTORY: Past Surgical History:  Procedure Laterality Date  . BREAST SURGERY    . EYE SURGERY    . HAND SURGERY    .  KIDNEY DONATION    . KIDNEY DONATION    . TUBAL LIGATION      SOCIAL HISTORY: Social History   Tobacco Use  . Smoking status: Former Smoker    Packs/day: 1.00    Years: 41.00    Pack years: 41.00    Types: Cigarettes    Last attempt to quit: 03/03/2016    Years since quitting: 1.7  . Smokeless tobacco: Never Used  Substance Use Topics  . Alcohol use: Yes  . Drug use: Never    FAMILY HISTORY: Family History  Problem Relation Age of Onset  . Lung cancer Father        smoked  . Cancer Father        lung; +TOB  . Cancer Paternal Grandmother        "every kind of cancer"  . Heart disease Mother   . Diabetes Mother   . Hypertension Mother   . Hyperlipidemia Mother     ROS: Review of Systems  Constitutional: Positive for malaise/Gabrielle. Negative for weight loss.  Respiratory: Positive for shortness of breath.   Cardiovascular: Negative for chest pain.  Gastrointestinal: Positive for heartburn.    PHYSICAL EXAM: Blood pressure 117/84, pulse 63, height 5\' 3"  (1.6 m), weight 190 lb (86.2 kg), SpO2 99 %. Body mass index is 33.66 kg/m. Physical Exam  Constitutional: She is oriented to person, place, and time. She appears well-developed and well-nourished.  HENT:  Head: Normocephalic and atraumatic.  Nose: Nose normal.  Eyes: EOM are normal. No scleral icterus.  Neck: Normal range of motion. Neck supple. No thyromegaly present.  Cardiovascular: Normal rate and regular rhythm.  Pulmonary/Chest: Effort normal. No respiratory distress.  Abdominal: Soft. There is no tenderness.  + Obesity  Musculoskeletal:  Range of Motion normal in all 4 extremities Trace edema noted in bilateral lower extremities  Neurological: She is alert and oriented to person, place, and time. Coordination normal.  Skin: Skin is warm and dry.  Psychiatric: She has a normal mood and affect. Her behavior is normal.  Vitals reviewed.   RECENT LABS AND TESTS: BMET    Component Value Date/Time     NA 140 12/02/2017 0846   K 4.7 12/02/2017 0846   CL 99 12/02/2017 0846   CO2 25 12/02/2017 0846   GLUCOSE 94 12/02/2017 0846   BUN 16 12/02/2017 0846   CREATININE 1.00 12/02/2017 0846   CALCIUM 9.7 12/02/2017 0846   GFRNONAA 61 12/02/2017 0846   GFRAA 70 12/02/2017 0846   Lab Results  Component Value Date   HGBA1C 5.5 12/02/2017   Lab Results  Component Value Date   INSULIN 8.2 12/02/2017   CBC    Component Value Date/Time   WBC 6.3 12/02/2017 0846   WBC 8.5 07/19/2017 1535   RBC 4.63 12/02/2017 0846   RBC 4.93 07/19/2017 1535   HGB 13.9 12/02/2017 0846   HCT 42.5 12/02/2017 0846   PLT 230 04/29/2017 1146   MCV 92 12/02/2017 0846   MCH 30.0 12/02/2017 0846  MCH 29.3 07/19/2017 1535   MCHC 32.7 12/02/2017 0846   MCHC 32.7 07/19/2017 1535   RDW 13.2 12/02/2017 0846   LYMPHSABS 1.8 12/02/2017 0846   EOSABS 0.3 12/02/2017 0846   BASOSABS 0.1 12/02/2017 0846   Iron/TIBC/Ferritin/ %Sat No results found for: IRON, TIBC, FERRITIN, IRONPCTSAT Lipid Panel     Component Value Date/Time   CHOL 240 (H) 12/02/2017 0846   TRIG 132 12/02/2017 0846   HDL 88 12/02/2017 0846   CHOLHDL 2.6 04/29/2017 1146   LDLCALC 126 (H) 12/02/2017 0846   Hepatic Function Panel     Component Value Date/Time   PROT 6.9 12/02/2017 0846   ALBUMIN 4.6 12/02/2017 0846   AST 19 12/02/2017 0846   ALT 20 12/02/2017 0846   ALKPHOS 74 12/02/2017 0846   BILITOT 0.4 12/02/2017 0846      Component Value Date/Time   TSH 1.360 12/02/2017 0846   TSH 1.050 04/29/2017 1146   TSH 1.830 06/30/2016 1804    ECG  shows NSR with a rate of 69 BPM INDIRECT CALORIMETER done today shows a VO2 of 288 and a REE of 2001.  Her calculated basal metabolic rate is 5465 thus her basal metabolic rate is better than expected.    ASSESSMENT AND PLAN: Other Gabrielle - Plan: EKG 12-Lead, Vitamin B12, CBC With Differential, Comprehensive metabolic panel, Folate, Hemoglobin A1c, Insulin, random, Lipid Panel With  LDL/HDL Ratio, T3, T4, free, TSH, VITAMIN D 25 Hydroxy (Vit-D Deficiency, Fractures)  Chronic obstructive pulmonary disease, unspecified COPD type (HCC)  Depression screening  At risk for heart disease  Class 1 obesity with serious comorbidity and body mass index (BMI) of 33.0 to 33.9 in adult, unspecified obesity type  PLAN:  Gabrielle Gabrielle Castro was informed that her Gabrielle may be related to obesity, depression or many other causes. Labs will be ordered, and in the meanwhile Gabrielle Castro has agreed to work on diet, exercise and weight loss to help with Gabrielle. Proper sleep hygiene was discussed including the need for 7-8 hours of quality sleep each night. A sleep study was not ordered based on symptoms and Epworth score.  Chronic Obstructive Pulmonary Disease Gabrielle Castro was done today and Gabrielle Castro is to continue with exercise and weight loss. Gabrielle Castro agrees to follow up with our clinic in 2 weeks.  Cardiovascular risk counselling Gabrielle Castro was given extended (15 minutes) coronary artery disease prevention counseling today. She is 62 y.o. female and has risk factors for heart disease including obesity and COPD. We discussed intensive lifestyle modifications today with an emphasis on specific weight loss instructions and strategies. Pt was also informed of the importance of increasing exercise and decreasing saturated fats to help prevent heart disease.  Depression Screen Gabrielle Castro had a moderately positive depression screening. Depression is commonly associated with obesity and often results in emotional eating behaviors. We will monitor this closely and work on CBT to help improve the non-hunger eating patterns. Referral to Psychology may be required if no improvement is seen as she continues in our clinic.  Obesity Gabrielle Castro is currently in the action stage of change and her goal is to continue with weight loss efforts. I recommend Gabrielle Castro begin the structured treatment plan as follows:  She has  agreed to follow the Category 2 plan Gabrielle Castro has been instructed to eventually work up to a goal of 150 minutes of combined cardio and strengthening exercise per week for weight loss and overall health benefits. We discussed the following Behavioral Modification Strategies today: increasing lean protein intake, decreasing  simple carbohydrates , decrease eating out and work on meal planning and easy cooking plans   She was informed of the importance of frequent follow up visits to maximize her success with intensive lifestyle modifications for her multiple health conditions. She was informed we would discuss her lab results at her next visit unless there is a critical issue that needs to be addressed sooner. Jull agreed to keep her next visit at the agreed upon time to discuss these results.    OBESITY BEHAVIORAL INTERVENTION VISIT  Today's visit was # 1   Starting weight: 190 lbs Starting date: 12/02/17 Today's weight : 190 lbs  Today's date: 12/02/2017 Total lbs lost to date: 0    ASK: We discussed the diagnosis of obesity with Gabrielle Castro today and Gabrielle Castro agreed to give [place] permission to discuss obesity behavioral modification therapy today.  ASSESS: Gabrielle Castro has the diagnosis of obesity and her BMI today is 33.67 Ceirra is in the action stage of change   ADVISE: Areil was educated on the multiple health risks of obesity as well as the benefit of weight loss to improve her health. She was advised of the need for long term treatment and the importance of lifestyle modifications to improve her current health and to decrease her risk of future health problems.  AGREE: Multiple dietary modification options and treatment options were discussed and  Aliegha agreed to follow the recommendations documented in the above note.  ARRANGE: Lajuanda was educated on the importance of frequent visits to treat obesity as outlined per CMS and USPSTF guidelines and agreed to schedule  her next follow up appointment today.  I, Trixie Dredge, am acting as transcriptionist for Dennard Nip, MD  I have reviewed the above documentation for accuracy and completeness, and I agree with the above. -Dennard Nip, MD

## 2017-12-09 ENCOUNTER — Encounter (INDEPENDENT_AMBULATORY_CARE_PROVIDER_SITE_OTHER): Payer: Self-pay | Admitting: Family Medicine

## 2017-12-14 ENCOUNTER — Ambulatory Visit (INDEPENDENT_AMBULATORY_CARE_PROVIDER_SITE_OTHER): Payer: 59 | Admitting: Family Medicine

## 2017-12-14 VITALS — BP 118/81 | HR 66 | Temp 97.9°F | Ht 63.0 in | Wt 188.0 lb

## 2017-12-14 DIAGNOSIS — E559 Vitamin D deficiency, unspecified: Secondary | ICD-10-CM

## 2017-12-14 DIAGNOSIS — Z9189 Other specified personal risk factors, not elsewhere classified: Secondary | ICD-10-CM | POA: Diagnosis not present

## 2017-12-14 DIAGNOSIS — J449 Chronic obstructive pulmonary disease, unspecified: Secondary | ICD-10-CM | POA: Diagnosis not present

## 2017-12-14 DIAGNOSIS — E669 Obesity, unspecified: Secondary | ICD-10-CM

## 2017-12-14 DIAGNOSIS — Z6833 Body mass index (BMI) 33.0-33.9, adult: Secondary | ICD-10-CM | POA: Diagnosis not present

## 2017-12-14 MED ORDER — VITAMIN D (ERGOCALCIFEROL) 1.25 MG (50000 UNIT) PO CAPS: 50000.0000 [IU] | ORAL_CAPSULE | ORAL | 0 refills | Status: DC

## 2017-12-14 MED FILL — VIT D2 1.25 MG (50,000 UNIT: 1.25 MG | 28 days supply | Qty: 4 | Fill #0

## 2017-12-14 NOTE — Progress Notes (Signed)
Office: [phone]  /  Fax: [phone]   HPI:   Chief Complaint: OBESITY Gabrielle Castro is here to discuss her progress with her obesity treatment plan. She is on the Category 2 plan and is following her eating plan approximately 100 % of the time. She states she is exercising 0 minutes 0 times per week. Wednesday did well with weight loss on her Category 2 plan. Her hunger and cravings were controlled but she is a bit bored with lunch options.  Her weight is 188 lb (85.3 kg) today and has had a weight loss of 2 pounds over a period of 1 to 2 weeks since her last visit. She has lost 2 lbs since starting treatment with [place].  Vitamin D Deficiency Gabrielle Castro has a new diagnosis of vitamin D deficiency. She is not on Vit D, and she notes a family history of osteoporosis. She notes fatigue and denies nausea, vomiting or muscle weakness.  At risk for osteopenia and osteoporosis Gabrielle Castro is at higher risk of osteopenia and osteoporosis due to vitamin D deficiency.   COPD Gabrielle Castro has COPD and she is stable on Qvar. She denies any recent exacerbations. Her goal is to lose weight to decrease the risk of complications.  ALLERGIES: Allergies  Allergen Reactions  . Nickel Swelling, Hives and Itching  . Tape Rash    MEDICATIONS: Current Outpatient Medications on File Prior to Visit  Medication Sig Dispense Refill  . albuterol (PROVENTIL HFA;VENTOLIN HFA) 108 (90 Base) MCG/ACT inhaler Inhale 2 puffs into the lungs every 4 (four) hours as needed for wheezing or shortness of breath (cough, shortness of breath or wheezing.). 1 Inhaler 1  . Ascorbic Acid (VITAMIN C) 1000 MG tablet Take 1,000 mg by mouth daily.    . beclomethasone (QVAR) 40 MCG/ACT inhaler Inhale 2 puffs into the lungs 2 (two) times daily. 10.6 g 12  . Biotin 10000 MCG TABS Take 2 tablets by mouth daily.    . clonazePAM (KLONOPIN) 0.5 MG tablet Take 1 tablet (0.5 mg total) by mouth 2 (two) times daily as needed for anxiety. 30 tablet 0    . ferrous sulfate 325 (65 FE) MG tablet Take 325 mg by mouth every 3 (three) days.    Marland Kitchen Lysine 1000 MG TABS Take 1 tablet by mouth daily.    . SUPER B COMPLEX/C PO Take 2 capsules by mouth daily.     No current facility-administered medications on file prior to visit.     PAST MEDICAL HISTORY: Past Medical History:  Diagnosis Date  . Anxiety   . COPD (chronic obstructive pulmonary disease) ([place])   . Dyspnea   . EP (ectopic pregnancy)   . GERD (gastroesophageal reflux disease)     PAST SURGICAL HISTORY: Past Surgical History:  Procedure Laterality Date  . BREAST SURGERY    . EYE SURGERY    . HAND SURGERY    . KIDNEY DONATION    . KIDNEY DONATION    . TUBAL LIGATION      SOCIAL HISTORY: Social History   Tobacco Use  . Smoking status: Former Smoker    Packs/day: 1.00    Years: 41.00    Pack years: 41.00    Types: Cigarettes    Last attempt to quit: 03/03/2016    Years since quitting: 1.7  . Smokeless tobacco: Never Used  Substance Use Topics  . Alcohol use: Yes  . Drug use: Never    FAMILY HISTORY: Family History  Problem Relation Age of Onset  .  Lung cancer Father        smoked  . Cancer Father        lung; +TOB  . Cancer Paternal Grandmother        "every kind of cancer"  . Heart disease Mother   . Diabetes Mother   . Hypertension Mother   . Hyperlipidemia Mother     ROS: Review of Systems  Constitutional: Positive for malaise/fatigue and weight loss.  Gastrointestinal: Negative for nausea and vomiting.  Musculoskeletal:       Negative muscle weakness    PHYSICAL EXAM: Blood pressure 118/81, pulse 66, temperature 97.9 F (36.6 C), temperature source Oral, height 5\' 3"  (1.6 m), weight 188 lb (85.3 kg), SpO2 98 %. Body mass index is 33.3 kg/m. Physical Exam  Constitutional: She is oriented to person, place, and time. She appears well-developed and well-nourished.  Cardiovascular: Normal rate.  Pulmonary/Chest: Effort normal.  Musculoskeletal:  Normal range of motion.  Neurological: She is oriented to person, place, and time.  Skin: Skin is warm and dry.  Psychiatric: She has a normal mood and affect. Her behavior is normal.  Vitals reviewed.   RECENT LABS AND TESTS: BMET    Component Value Date/Time   NA 140 12/02/2017 0846   K 4.7 12/02/2017 0846   CL 99 12/02/2017 0846   CO2 25 12/02/2017 0846   GLUCOSE 94 12/02/2017 0846   BUN 16 12/02/2017 0846   CREATININE 1.00 12/02/2017 0846   CALCIUM 9.7 12/02/2017 0846   GFRNONAA 61 12/02/2017 0846   GFRAA 70 12/02/2017 0846   Lab Results  Component Value Date   HGBA1C 5.5 12/02/2017   Lab Results  Component Value Date   INSULIN 8.2 12/02/2017   CBC    Component Value Date/Time   WBC 6.3 12/02/2017 0846   WBC 8.5 07/19/2017 1535   RBC 4.63 12/02/2017 0846   RBC 4.93 07/19/2017 1535   HGB 13.9 12/02/2017 0846   HCT 42.5 12/02/2017 0846   PLT 230 04/29/2017 1146   MCV 92 12/02/2017 0846   MCH 30.0 12/02/2017 0846   MCH 29.3 07/19/2017 1535   MCHC 32.7 12/02/2017 0846   MCHC 32.7 07/19/2017 1535   RDW 13.2 12/02/2017 0846   LYMPHSABS 1.8 12/02/2017 0846   EOSABS 0.3 12/02/2017 0846   BASOSABS 0.1 12/02/2017 0846   Iron/TIBC/Ferritin/ %Sat No results found for: IRON, TIBC, FERRITIN, IRONPCTSAT Lipid Panel     Component Value Date/Time   CHOL 240 (H) 12/02/2017 0846   TRIG 132 12/02/2017 0846   HDL 88 12/02/2017 0846   CHOLHDL 2.6 04/29/2017 1146   LDLCALC 126 (H) 12/02/2017 0846   Hepatic Function Panel     Component Value Date/Time   PROT 6.9 12/02/2017 0846   ALBUMIN 4.6 12/02/2017 0846   AST 19 12/02/2017 0846   ALT 20 12/02/2017 0846   ALKPHOS 74 12/02/2017 0846   BILITOT 0.4 12/02/2017 0846      Component Value Date/Time   TSH 1.360 12/02/2017 0846   TSH 1.050 04/29/2017 1146   TSH 1.830 06/30/2016 1804  Results for KENIDI, ELENBAAS "ROSILYN COACHMAN" (MRN [id number]) as of 12/14/2017 16:39  Ref. Range 12/02/2017 08:46  Vitamin D, 25-Hydroxy  Latest Ref Range: 30.0 - 100.0 ng/mL 31.5    ASSESSMENT AND PLAN: Vitamin D deficiency - Plan: Vitamin D, Ergocalciferol, (DRISDOL) 50000 units CAPS capsule  Chronic obstructive pulmonary disease, unspecified COPD type ([place])  At risk for osteoporosis  Class 1 obesity with serious comorbidity and body  mass index (BMI) of 33.0 to 33.9 in adult, unspecified obesity type  PLAN:  Vitamin D Deficiency Audreena was informed that low vitamin D levels contributes to fatigue and are associated with obesity, breast, and colon cancer. Caitriona agrees to start prescription Vit D @50 ,000 IU every week #4 with no refills. She will follow up for routine testing of vitamin D, at least 2-3 times per year. She was informed of the risk of over-replacement of vitamin D and agrees to not increase her dose unless she discusses this with [place] first. Diego agrees to follow up with our clinic in 2 weeks.  At risk for osteopenia and osteoporosis Milianna was given extended (15 minutes) osteoporosis prevention counseling today. Lydiana is at risk for osteopenia and osteoporsis due to her vitamin D deficiency. She was encouraged to take her vitamin D and follow her higher calcium diet and increase strengthening exercise to help strengthen her bones and decrease her risk of osteopenia and osteoporosis.  COPD Sasha will continue diet and weight loss, and will continue to monitor. Candence agrees to continue taking Qvar and she agrees to follow up with our clinic in 2 weeks.  Obesity Amarri is currently in the action stage of change. As such, her goal is to continue with weight loss efforts She has agreed to follow the Category 2 plan + 100 calories Carson has been instructed to work up to a goal of 150 minutes of combined cardio and strengthening exercise per week for weight loss and overall health benefits. We discussed the following Behavioral Modification Strategies today: increasing lean protein intake  and decreasing simple carbohydrates    Filippa has agreed to follow up with our clinic in 2 weeks. She was informed of the importance of frequent follow up visits to maximize her success with intensive lifestyle modifications for her multiple health conditions.   OBESITY BEHAVIORAL INTERVENTION VISIT  Today's visit was # 2   Starting weight: 190 lbs Starting date: 12/02/17 Today's weight : 188 lbs Today's date: 12/14/2017 Total lbs lost to date: 2    ASK: We discussed the diagnosis of obesity with Jenny Reichmann today and Joelene Millin agreed to give [place] permission to discuss obesity behavioral modification therapy today.  ASSESS: Margeart has the diagnosis of obesity and her BMI today is 33.31 Ethelyn is in the action stage of change   ADVISE: Taralynn was educated on the multiple health risks of obesity as well as the benefit of weight loss to improve her health. She was advised of the need for long term treatment and the importance of lifestyle modifications to improve her current health and to decrease her risk of future health problems.  AGREE: Multiple dietary modification options and treatment options were discussed and  Maribell agreed to follow the recommendations documented in the above note.  ARRANGE: Sander was educated on the importance of frequent visits to treat obesity as outlined per CMS and USPSTF guidelines and agreed to schedule her next follow up appointment today.  I, Trixie Dredge, am acting as transcriptionist for Dennard Nip, MD  I have reviewed the above documentation for accuracy and completeness, and I agree with the above. -Dennard Nip, MD

## 2017-12-15 ENCOUNTER — Encounter (INDEPENDENT_AMBULATORY_CARE_PROVIDER_SITE_OTHER): Payer: Self-pay | Admitting: Family Medicine

## 2017-12-28 ENCOUNTER — Encounter (INDEPENDENT_AMBULATORY_CARE_PROVIDER_SITE_OTHER): Payer: Self-pay | Admitting: Family Medicine

## 2017-12-28 ENCOUNTER — Ambulatory Visit (INDEPENDENT_AMBULATORY_CARE_PROVIDER_SITE_OTHER): Payer: 59 | Admitting: Family Medicine

## 2017-12-28 VITALS — BP 92/68 | HR 75 | Temp 98.5°F | Ht 63.0 in | Wt 185.0 lb

## 2017-12-28 DIAGNOSIS — E669 Obesity, unspecified: Secondary | ICD-10-CM

## 2017-12-28 DIAGNOSIS — E66811 Obesity, class 1: Secondary | ICD-10-CM

## 2017-12-28 DIAGNOSIS — Z6832 Body mass index (BMI) 32.0-32.9, adult: Secondary | ICD-10-CM | POA: Diagnosis not present

## 2017-12-28 DIAGNOSIS — E559 Vitamin D deficiency, unspecified: Secondary | ICD-10-CM

## 2017-12-29 ENCOUNTER — Encounter (INDEPENDENT_AMBULATORY_CARE_PROVIDER_SITE_OTHER): Payer: Self-pay | Admitting: Family Medicine

## 2017-12-30 ENCOUNTER — Encounter (INDEPENDENT_AMBULATORY_CARE_PROVIDER_SITE_OTHER): Payer: Self-pay | Admitting: Family Medicine

## 2017-12-30 NOTE — Progress Notes (Signed)
Office: [phone]  /  Fax: [phone]   HPI:   Chief Complaint: OBESITY Gabrielle Castro is here to discuss her progress with her obesity treatment plan. She is on the Category 2 plan + 100 calories and is following her eating plan approximately 98 % of the time. She states she is exercising 0 minutes 0 times per week. [name] has been ill with a GI virus (nausea, vomiting, and diarrhea) and was not eating for the past 2 days.  Her weight is 185 lb (83.9 kg) today and has had a weight loss of 3 pounds over a period of 2 weeks since her last visit. She has lost 5 lbs since starting treatment with [place].  Vitamin D deficiency Gabrielle Castro has a diagnosis of vitamin D deficiency. She is currently taking vit D, which is stable, but not at goal. Her last vitamin D level was 31.5 on 12/02/17. She denies nausea, vomiting, or muscle weakness.  ALLERGIES: Allergies  Allergen Reactions  . Nickel Swelling, Hives and Itching  . Tape Rash    MEDICATIONS: Current Outpatient Medications on File Prior to Visit  Medication Sig Dispense Refill  . albuterol (PROVENTIL HFA;VENTOLIN HFA) 108 (90 Base) MCG/ACT inhaler Inhale 2 puffs into the lungs every 4 (four) hours as needed for wheezing or shortness of breath (cough, shortness of breath or wheezing.). 1 Inhaler 1  . Ascorbic Acid (VITAMIN C) 1000 MG tablet Take 1,000 mg by mouth daily.    . beclomethasone (QVAR) 40 MCG/ACT inhaler Inhale 2 puffs into the lungs 2 (two) times daily. 10.6 g 12  . Biotin 10000 MCG TABS Take 2 tablets by mouth daily.    . clonazePAM (KLONOPIN) 0.5 MG tablet Take 1 tablet (0.5 mg total) by mouth 2 (two) times daily as needed for anxiety. 30 tablet 0  . ferrous sulfate 325 (65 FE) MG tablet Take 325 mg by mouth every 3 (three) days.    Marland Kitchen Lysine 1000 MG TABS Take 1 tablet by mouth daily.    . SUPER B COMPLEX/C PO Take 2 capsules by mouth daily.    . Vitamin D, Ergocalciferol, (DRISDOL) 50000 units CAPS capsule Take 1 capsule (50,000  Units total) by mouth every 7 (seven) days. 4 capsule 0   No current facility-administered medications on file prior to visit.     PAST MEDICAL HISTORY: Past Medical History:  Diagnosis Date  . Anxiety   . COPD (chronic obstructive pulmonary disease) ([place])   . Dyspnea   . EP (ectopic pregnancy)   . GERD (gastroesophageal reflux disease)     PAST SURGICAL HISTORY: Past Surgical History:  Procedure Laterality Date  . BREAST SURGERY    . EYE SURGERY    . HAND SURGERY    . KIDNEY DONATION    . KIDNEY DONATION    . TUBAL LIGATION      SOCIAL HISTORY: Social History   Tobacco Use  . Smoking status: Former Smoker    Packs/day: 1.00    Years: 41.00    Pack years: 41.00    Types: Cigarettes    Last attempt to quit: 03/03/2016    Years since quitting: 1.8  . Smokeless tobacco: Never Used  Substance Use Topics  . Alcohol use: Yes  . Drug use: Never    FAMILY HISTORY: Family History  Problem Relation Age of Onset  . Lung cancer Father        smoked  . Cancer Father        lung; +TOB  .  Cancer Paternal Grandmother        "every kind of cancer"  . Heart disease Mother   . Diabetes Mother   . Hypertension Mother   . Hyperlipidemia Mother     ROS: Review of Systems  Constitutional: Positive for weight loss.  Gastrointestinal: Positive for diarrhea, nausea and vomiting.  Neurological: Negative for weakness.    PHYSICAL EXAM: Blood pressure 92/68, pulse 75, temperature 98.5 F (36.9 C), temperature source Oral, height 5\' 3"  (1.6 m), weight 185 lb (83.9 kg), SpO2 96 %. Body mass index is 32.77 kg/m. Physical Exam  Constitutional: She is oriented to person, place, and time. She appears well-developed and well-nourished.  Cardiovascular: Normal rate.  Pulmonary/Chest: Effort normal.  Musculoskeletal: Normal range of motion.  Neurological: She is alert and oriented to person, place, and time.  Skin: Skin is warm and dry.  Psychiatric: She has a normal mood and  affect. Her behavior is normal.  Vitals reviewed.   RECENT LABS AND TESTS: BMET    Component Value Date/Time   NA 140 12/02/2017 0846   K 4.7 12/02/2017 0846   CL 99 12/02/2017 0846   CO2 25 12/02/2017 0846   GLUCOSE 94 12/02/2017 0846   BUN 16 12/02/2017 0846   CREATININE 1.00 12/02/2017 0846   CALCIUM 9.7 12/02/2017 0846   GFRNONAA 61 12/02/2017 0846   GFRAA 70 12/02/2017 0846   Lab Results  Component Value Date   HGBA1C 5.5 12/02/2017   Lab Results  Component Value Date   INSULIN 8.2 12/02/2017   CBC    Component Value Date/Time   WBC 6.3 12/02/2017 0846   WBC 8.5 07/19/2017 1535   RBC 4.63 12/02/2017 0846   RBC 4.93 07/19/2017 1535   HGB 13.9 12/02/2017 0846   HCT 42.5 12/02/2017 0846   PLT 230 04/29/2017 1146   MCV 92 12/02/2017 0846   MCH 30.0 12/02/2017 0846   MCH 29.3 07/19/2017 1535   MCHC 32.7 12/02/2017 0846   MCHC 32.7 07/19/2017 1535   RDW 13.2 12/02/2017 0846   LYMPHSABS 1.8 12/02/2017 0846   EOSABS 0.3 12/02/2017 0846   BASOSABS 0.1 12/02/2017 0846   Iron/TIBC/Ferritin/ %Sat No results found for: IRON, TIBC, FERRITIN, IRONPCTSAT Lipid Panel     Component Value Date/Time   CHOL 240 (H) 12/02/2017 0846   TRIG 132 12/02/2017 0846   HDL 88 12/02/2017 0846   CHOLHDL 2.6 04/29/2017 1146   LDLCALC 126 (H) 12/02/2017 0846   Hepatic Function Panel     Component Value Date/Time   PROT 6.9 12/02/2017 0846   ALBUMIN 4.6 12/02/2017 0846   AST 19 12/02/2017 0846   ALT 20 12/02/2017 0846   ALKPHOS 74 12/02/2017 0846   BILITOT 0.4 12/02/2017 0846      Component Value Date/Time   TSH 1.360 12/02/2017 0846   TSH 1.050 04/29/2017 1146   TSH 1.830 06/30/2016 1804   Results for BAHAR, SHELDEN "AJIA CHADDERDON" (MRN [id number]) as of 12/30/2017 13:01  Ref. Range 12/02/2017 08:46  Vitamin D, 25-Hydroxy Latest Ref Range: 30.0 - 100.0 ng/mL 31.5   ASSESSMENT AND PLAN: Vitamin D deficiency  Class 1 obesity with serious comorbidity and body mass index  (BMI) of 32.0 to 32.9 in adult, unspecified obesity type  PLAN:  Vitamin D Deficiency Gabrielle Castro was informed that low vitamin D levels contributes to fatigue and are associated with obesity, breast, and colon cancer. She agrees to continue to take prescription Vit D @50 ,000 IU every week and will follow up  for routine testing of vitamin D, at least 2-3 times per year. She was informed of the risk of over-replacement of vitamin D and agrees to not increase her dose unless she discusses this with [place] first. Venetia agrees to follow up as directed in 2 weeks.  I spent > than 50% of the 15 minute visit on counseling as documented in the note.  Obesity Letzy is currently in the action stage of change. As such, her goal is to continue with weight loss efforts. She has agreed to follow the Category 2 plan + 100 calories and increase water intake.  Jayda has not been prescribed exercise at this time.  We discussed the following Behavioral Modification Strategies today: increase H2O intake, work on meal planning and easy cooking plans, better snacking choices, holiday eating strategies, and planning for success.  Milley agrees to continue with her bland diet until her GI symptoms subside.  Akeya has agreed to follow up with our clinic in 2 weeks. She was informed of the importance of frequent follow up visits to maximize her success with intensive lifestyle modifications for her multiple health conditions.   OBESITY BEHAVIORAL INTERVENTION VISIT  Today's visit was # 3   Starting weight: 190 lbs Starting date: 12/02/17 Today's weight : Weight: 185 lb (83.9 kg)  Today's date: 12/28/2017 Total lbs lost to date: 5  ASK: We discussed the diagnosis of obesity with Jenny Reichmann today and Joelene Millin agreed to give [place] permission to discuss obesity behavioral modification therapy today.  ASSESS: Zoriyah has the diagnosis of obesity and her BMI today is 32.78. Alesa is in the action  stage of change.   ADVISE: Luvena was educated on the multiple health risks of obesity as well as the benefit of weight loss to improve her health. She was advised of the need for long term treatment and the importance of lifestyle modifications to improve her current health and to decrease her risk of future health problems.  AGREE: Multiple dietary modification options and treatment options were discussed and Timesha agreed to follow the recommendations documented in the above note.  ARRANGE: Makenah was educated on the importance of frequent visits to treat obesity as outlined per CMS and USPSTF guidelines and agreed to schedule her next follow up appointment today.  Lenward Chancellor, am acting as Location manager for Georgianne Fick, FNP.  I have reviewed the above documentation for accuracy and completeness, and I agree with the above.  - Jahziel Sinn, FNP-C.

## 2017-12-31 MED FILL — QVAR REDIHALER 40 MCG/ACT A: 40 | 30 days supply | Qty: 11 | Fill #8

## 2018-01-11 ENCOUNTER — Encounter (INDEPENDENT_AMBULATORY_CARE_PROVIDER_SITE_OTHER): Payer: Self-pay | Admitting: Family Medicine

## 2018-01-11 ENCOUNTER — Ambulatory Visit (INDEPENDENT_AMBULATORY_CARE_PROVIDER_SITE_OTHER): Payer: 59 | Admitting: Family Medicine

## 2018-01-11 VITALS — BP 129/82 | HR 69 | Temp 98.2°F | Ht 63.0 in | Wt 187.0 lb

## 2018-01-11 DIAGNOSIS — E8881 Metabolic syndrome: Secondary | ICD-10-CM | POA: Diagnosis not present

## 2018-01-11 DIAGNOSIS — Z9189 Other specified personal risk factors, not elsewhere classified: Secondary | ICD-10-CM | POA: Diagnosis not present

## 2018-01-11 DIAGNOSIS — Z6833 Body mass index (BMI) 33.0-33.9, adult: Secondary | ICD-10-CM

## 2018-01-11 DIAGNOSIS — E669 Obesity, unspecified: Secondary | ICD-10-CM

## 2018-01-11 DIAGNOSIS — E559 Vitamin D deficiency, unspecified: Secondary | ICD-10-CM | POA: Diagnosis not present

## 2018-01-11 MED ORDER — VITAMIN D (ERGOCALCIFEROL) 1.25 MG (50000 UNIT) PO CAPS: 50000.0000 [IU] | ORAL_CAPSULE | ORAL | 0 refills | Status: DC

## 2018-01-11 MED FILL — VIT D2 1.25 MG (50,000 UNIT: 1.25 MG | 28 days supply | Qty: 4 | Fill #0

## 2018-01-12 ENCOUNTER — Ambulatory Visit (INDEPENDENT_AMBULATORY_CARE_PROVIDER_SITE_OTHER): Payer: 59 | Admitting: Family Medicine

## 2018-01-12 ENCOUNTER — Encounter (INDEPENDENT_AMBULATORY_CARE_PROVIDER_SITE_OTHER): Payer: Self-pay

## 2018-01-13 ENCOUNTER — Encounter (INDEPENDENT_AMBULATORY_CARE_PROVIDER_SITE_OTHER): Payer: Self-pay | Admitting: Family Medicine

## 2018-01-13 DIAGNOSIS — E8881 Metabolic syndrome: Secondary | ICD-10-CM | POA: Insufficient documentation

## 2018-01-13 DIAGNOSIS — E559 Vitamin D deficiency, unspecified: Secondary | ICD-10-CM | POA: Insufficient documentation

## 2018-01-13 DIAGNOSIS — E88819 Insulin resistance, unspecified: Secondary | ICD-10-CM | POA: Insufficient documentation

## 2018-01-13 NOTE — Progress Notes (Signed)
Office: [phone]  /  Fax: [phone]   HPI:   Chief Complaint: OBESITY Gabrielle Castro is here to discuss her progress with her obesity treatment plan. She is on the Category 2 plan + 100 calories and is following her eating plan approximately 100% of the time. She states she is exercising 0 minutes 0 times per week. [name] struggled to plan well over Thanksgiving. She is eating all the protein on the plan.  Her weight is 187 lb (84.8 kg) today and has gained 2 pounds since her last visit. She has lost 3 lbs since starting treatment with [place].  Vitamin D Deficiency Gabrielle Castro has a diagnosis of vitamin D deficiency. She is currently taking prescription Vit D, but level is not at goal. Last Vit D was 31.5 on 12/02/17. She denies nausea, vomiting or muscle weakness.  At risk for osteopenia and osteoporosis Gabrielle Castro is at higher risk of osteopenia and osteoporosis due to vitamin D deficiency.   Insulin Resistance Gabrielle Castro has a diagnosis of insulin resistance based on her elevated fasting insulin level >5. Although Gabrielle Castro's blood glucose readings are still under good control, insulin resistance puts her at greater risk of metabolic syndrome and diabetes. She is not on metformin currently and continues to work on diet and exercise to decrease risk of diabetes. She denies polyphagia.  ALLERGIES: Allergies  Allergen Reactions  . Nickel Swelling, Hives and Itching  . Tape Rash    MEDICATIONS: Current Outpatient Medications on File Prior to Visit  Medication Sig Dispense Refill  . albuterol (PROVENTIL HFA;VENTOLIN HFA) 108 (90 Base) MCG/ACT inhaler Inhale 2 puffs into the lungs every 4 (four) hours as needed for wheezing or shortness of breath (cough, shortness of breath or wheezing.). 1 Inhaler 1  . Ascorbic Acid (VITAMIN C) 1000 MG tablet Take 1,000 mg by mouth daily.    . beclomethasone (QVAR) 40 MCG/ACT inhaler Inhale 2 puffs into the lungs 2 (two) times daily. 10.6 g 12  . Biotin  10000 MCG TABS Take 2 tablets by mouth daily.    . clonazePAM (KLONOPIN) 0.5 MG tablet Take 1 tablet (0.5 mg total) by mouth 2 (two) times daily as needed for anxiety. 30 tablet 0  . ferrous sulfate 325 (65 FE) MG tablet Take 325 mg by mouth every 3 (three) days.    Marland Kitchen Lysine 1000 MG TABS Take 1 tablet by mouth daily.    . SUPER B COMPLEX/C PO Take 2 capsules by mouth daily.     No current facility-administered medications on file prior to visit.     PAST MEDICAL HISTORY: Past Medical History:  Diagnosis Date  . Anxiety   . COPD (chronic obstructive pulmonary disease) ([place])   . Dyspnea   . EP (ectopic pregnancy)   . GERD (gastroesophageal reflux disease)     PAST SURGICAL HISTORY: Past Surgical History:  Procedure Laterality Date  . BREAST SURGERY    . EYE SURGERY    . HAND SURGERY    . KIDNEY DONATION    . KIDNEY DONATION    . TUBAL LIGATION      SOCIAL HISTORY: Social History   Tobacco Use  . Smoking status: Former Smoker    Packs/day: 1.00    Years: 41.00    Pack years: 41.00    Types: Cigarettes    Last attempt to quit: 03/03/2016    Years since quitting: 1.8  . Smokeless tobacco: Never Used  Substance Use Topics  . Alcohol use: Yes  . Drug use:  Never    FAMILY HISTORY: Family History  Problem Relation Age of Onset  . Lung cancer Father        smoked  . Cancer Father        lung; +TOB  . Cancer Paternal Grandmother        "every kind of cancer"  . Heart disease Mother   . Diabetes Mother   . Hypertension Mother   . Hyperlipidemia Mother     ROS: Review of Systems  Constitutional: Negative for weight loss.  Gastrointestinal: Negative for nausea and vomiting.  Musculoskeletal:       Negative muscle weakness  Endo/Heme/Allergies:       Negative polyphagia    PHYSICAL EXAM: Blood pressure 129/82, pulse 69, temperature 98.2 F (36.8 C), temperature source Oral, height 5\' 3"  (1.6 m), weight 187 lb (84.8 kg), SpO2 96 %. Body mass index is 33.13  kg/m. Physical Exam  Constitutional: She is oriented to person, place, and time. She appears well-developed and well-nourished.  Cardiovascular: Normal rate.  Pulmonary/Chest: Effort normal.  Musculoskeletal: Normal range of motion.  Neurological: She is oriented to person, place, and time.  Skin: Skin is warm and dry.  Psychiatric: She has a normal mood and affect. Her behavior is normal.  Vitals reviewed.   RECENT LABS AND TESTS: BMET    Component Value Date/Time   NA 140 12/02/2017 0846   K 4.7 12/02/2017 0846   CL 99 12/02/2017 0846   CO2 25 12/02/2017 0846   GLUCOSE 94 12/02/2017 0846   BUN 16 12/02/2017 0846   CREATININE 1.00 12/02/2017 0846   CALCIUM 9.7 12/02/2017 0846   GFRNONAA 61 12/02/2017 0846   GFRAA 70 12/02/2017 0846   Lab Results  Component Value Date   HGBA1C 5.5 12/02/2017   Lab Results  Component Value Date   INSULIN 8.2 12/02/2017   CBC    Component Value Date/Time   WBC 6.3 12/02/2017 0846   WBC 8.5 07/19/2017 1535   RBC 4.63 12/02/2017 0846   RBC 4.93 07/19/2017 1535   HGB 13.9 12/02/2017 0846   HCT 42.5 12/02/2017 0846   PLT 230 04/29/2017 1146   MCV 92 12/02/2017 0846   MCH 30.0 12/02/2017 0846   MCH 29.3 07/19/2017 1535   MCHC 32.7 12/02/2017 0846   MCHC 32.7 07/19/2017 1535   RDW 13.2 12/02/2017 0846   LYMPHSABS 1.8 12/02/2017 0846   EOSABS 0.3 12/02/2017 0846   BASOSABS 0.1 12/02/2017 0846   Iron/TIBC/Ferritin/ %Sat No results found for: IRON, TIBC, FERRITIN, IRONPCTSAT Lipid Panel     Component Value Date/Time   CHOL 240 (H) 12/02/2017 0846   TRIG 132 12/02/2017 0846   HDL 88 12/02/2017 0846   CHOLHDL 2.6 04/29/2017 1146   LDLCALC 126 (H) 12/02/2017 0846   Hepatic Function Panel     Component Value Date/Time   PROT 6.9 12/02/2017 0846   ALBUMIN 4.6 12/02/2017 0846   AST 19 12/02/2017 0846   ALT 20 12/02/2017 0846   ALKPHOS 74 12/02/2017 0846   BILITOT 0.4 12/02/2017 0846      Component Value Date/Time   TSH  1.360 12/02/2017 0846   TSH 1.050 04/29/2017 1146   TSH 1.830 06/30/2016 1804  Results for Gabrielle, Castro "DALANI METTE" (MRN [id number]) as of 01/13/2018 12:52  Ref. Range 12/02/2017 08:46  Vitamin D, 25-Hydroxy Latest Ref Range: 30.0 - 100.0 ng/mL 31.5    ASSESSMENT AND PLAN: Vitamin D deficiency - Plan: Vitamin D, Ergocalciferol, (DRISDOL) 1.25 MG (50000  UT) CAPS capsule  Insulin resistance  At risk for osteoporosis  Class 1 obesity with serious comorbidity and body mass index (BMI) of 33.0 to 33.9 in adult, unspecified obesity type  PLAN:  Vitamin D Deficiency Kynedi was informed that low vitamin D levels contributes to fatigue and are associated with obesity, breast, and colon cancer. Jamise agrees to continue taking prescription Vit D @50 ,000 IU every week #4 and we will refill for 1 month. She will follow up for routine testing of vitamin D, at least 2-3 times per year. She was informed of the risk of over-replacement of vitamin D and agrees to not increase her dose unless she discusses this with [place] first. Marguerita agrees to follow up with our clinic in 2 weeks.  At risk for osteopenia and osteoporosis Deltha was given extended (15 minutes) osteoporosis prevention counseling today. Timika is at risk for osteopenia and osteoporsis due to her vitamin D deficiency. She was encouraged to take her vitamin D and follow her higher calcium diet and increase strengthening exercise to help strengthen her bones and decrease her risk of osteopenia and osteoporosis.  Insulin Resistance [place] will continue to work on weight loss, diet, exercise, and decreasing simple carbohydrates in her diet to help decrease the risk of diabetes. We dicussed metformin including benefits and risks.Joelene Millin declined metformin for now and prescription was not written today. Jeannene agrees to follow up with our clinic in 2 weeks as directed to monitor her progress.  Obesity Lanyia is currently in the  action stage of change. As such, her goal is to continue with weight loss efforts She has agreed to follow the Category 2 plan + 100 calories Menna has not been prescribed exercise at this time. We discussed the following Behavioral Modification Strategies today: planning for success  Brynlea has agreed to follow up with our clinic in 2 weeks. She was informed of the importance of frequent follow up visits to maximize her success with intensive lifestyle modifications for her multiple health conditions.   OBESITY BEHAVIORAL INTERVENTION VISIT  Today's visit was # 4  Starting weight: 190 lbs Starting date: 12/02/17 Today's weight : 187 lbs Today's date: 01/11/2018 Total lbs lost to date: 3    ASK: We discussed the diagnosis of obesity with Jenny Reichmann today and Joelene Millin agreed to give [place] permission to discuss obesity behavioral modification therapy today.  ASSESS: Natausha has the diagnosis of obesity and her BMI today is 33.13 Joby is in the action stage of change   ADVISE: Aleisa was educated on the multiple health risks of obesity as well as the benefit of weight loss to improve her health. She was advised of the need for long term treatment and the importance of lifestyle modifications to improve her current health and to decrease her risk of future health problems.  AGREE: Multiple dietary modification options and treatment options were discussed and  Sterling agreed to follow the recommendations documented in the above note.  ARRANGE: Briannon was educated on the importance of frequent visits to treat obesity as outlined per CMS and USPSTF guidelines and agreed to schedule her next follow up appointment today.  Wilhemena Durie, am acting as Location manager for [employer], FNP-C.  I have reviewed the above documentation for accuracy and completeness, and I agree with the above.  - Cherina Dhillon, FNP-C.

## 2018-01-25 ENCOUNTER — Ambulatory Visit (INDEPENDENT_AMBULATORY_CARE_PROVIDER_SITE_OTHER): Payer: 59 | Admitting: Family Medicine

## 2018-01-25 ENCOUNTER — Encounter (INDEPENDENT_AMBULATORY_CARE_PROVIDER_SITE_OTHER): Payer: Self-pay | Admitting: Family Medicine

## 2018-01-25 VITALS — BP 117/73 | HR 72 | Temp 97.5°F | Ht 63.0 in | Wt 184.0 lb

## 2018-01-25 DIAGNOSIS — E669 Obesity, unspecified: Secondary | ICD-10-CM

## 2018-01-25 DIAGNOSIS — E559 Vitamin D deficiency, unspecified: Secondary | ICD-10-CM | POA: Diagnosis not present

## 2018-01-25 DIAGNOSIS — Z6832 Body mass index (BMI) 32.0-32.9, adult: Secondary | ICD-10-CM

## 2018-01-25 DIAGNOSIS — E8881 Metabolic syndrome: Secondary | ICD-10-CM | POA: Diagnosis not present

## 2018-01-25 DIAGNOSIS — Z9189 Other specified personal risk factors, not elsewhere classified: Secondary | ICD-10-CM | POA: Diagnosis not present

## 2018-01-25 MED ORDER — VITAMIN D (ERGOCALCIFEROL) 1.25 MG (50000 UNIT) PO CAPS: 50000.0000 [IU] | ORAL_CAPSULE | ORAL | 0 refills | Status: DC

## 2018-01-25 NOTE — Progress Notes (Signed)
Office: [phone]  /  Fax: [phone]   HPI:   Chief Complaint: OBESITY Gabrielle Castro is here to discuss her progress with her obesity treatment plan. She is on the Category 2 plan + 100 calories and is following her eating plan approximately 99% of the time. She states she is exercising 0 minutes 0 times per week. Gabrielle Castro has done very well despite holiday celebrations.  Her weight is 184 lb (83.5 kg) today and has had a weight loss of 3 pounds over a period of 2 weeks since her last visit. She has lost 6 lbs since starting treatment with [place].  Vitamin D Deficiency Gabrielle Castro has a diagnosis of vitamin D deficiency. She is currently taking prescription Vit D, but level is not at goal. Last Vit D was 31.5 on 12/02/17. She denies nausea, vomiting or muscle weakness.  At risk for osteopenia and osteoporosis Gabrielle Castro is at higher risk of osteopenia and osteoporosis due to vitamin D deficiency.   Insulin Resistance Gabrielle Castro has a diagnosis of insulin resistance based on her elevated fasting insulin level >5. Although Gabrielle Castro's blood glucose readings are still under good control, insulin resistance puts her at greater risk of metabolic syndrome and diabetes. She is not taking metformin currently, she denies polyphagia, and continues to work on diet and exercise to decrease risk of diabetes.  ALLERGIES: Allergies  Allergen Reactions  . Nickel Swelling, Hives and Itching  . Tape Rash    MEDICATIONS: Current Outpatient Medications on File Prior to Visit  Medication Sig Dispense Refill  . albuterol (PROVENTIL HFA;VENTOLIN HFA) 108 (90 Base) MCG/ACT inhaler Inhale 2 puffs into the lungs every 4 (four) hours as needed for wheezing or shortness of breath (cough, shortness of breath or wheezing.). 1 Inhaler 1  . Ascorbic Acid (VITAMIN C) 1000 MG tablet Take 1,000 mg by mouth daily.    . beclomethasone (QVAR) 40 MCG/ACT inhaler Inhale 2 puffs into the lungs 2 (two) times daily. 10.6 g 12  .  Biotin 10000 MCG TABS Take 2 tablets by mouth daily.    . clonazePAM (KLONOPIN) 0.5 MG tablet Take 1 tablet (0.5 mg total) by mouth 2 (two) times daily as needed for anxiety. 30 tablet 0  . ferrous sulfate 325 (65 FE) MG tablet Take 325 mg by mouth every 3 (three) days.    Marland Kitchen Lysine 1000 MG TABS Take 1 tablet by mouth daily.    . SUPER B COMPLEX/C PO Take 2 capsules by mouth daily.     No current facility-administered medications on file prior to visit.     PAST MEDICAL HISTORY: Past Medical History:  Diagnosis Date  . Anxiety   . COPD (chronic obstructive pulmonary disease) ([place])   . Dyspnea   . EP (ectopic pregnancy)   . GERD (gastroesophageal reflux disease)     PAST SURGICAL HISTORY: Past Surgical History:  Procedure Laterality Date  . BREAST SURGERY    . EYE SURGERY    . HAND SURGERY    . KIDNEY DONATION    . KIDNEY DONATION    . TUBAL LIGATION      SOCIAL HISTORY: Social History   Tobacco Use  . Smoking status: Former Smoker    Packs/day: 1.00    Years: 41.00    Pack years: 41.00    Types: Cigarettes    Last attempt to quit: 03/03/2016    Years since quitting: 1.8  . Smokeless tobacco: Never Used  Substance Use Topics  . Alcohol use: Yes  .  Drug use: Never    FAMILY HISTORY: Family History  Problem Relation Age of Onset  . Lung cancer Father        smoked  . Cancer Father        lung; +TOB  . Cancer Paternal Grandmother        "every kind of cancer"  . Heart disease Mother   . Diabetes Mother   . Hypertension Mother   . Hyperlipidemia Mother     ROS: Review of Systems  Constitutional: Positive for weight loss.  Gastrointestinal: Negative for nausea and vomiting.  Musculoskeletal:       Negative muscle weakness  Endo/Heme/Allergies:       Negative polyphagia    PHYSICAL EXAM: Blood pressure 117/73, pulse 72, temperature (!) 97.5 F (36.4 C), temperature source Oral, height 5\' 3"  (1.6 m), weight 184 lb (83.5 kg), SpO2 98 %. Body mass index  is 32.59 kg/m. Physical Exam Vitals signs reviewed.  Constitutional:      Appearance: Normal appearance. She is obese.  Cardiovascular:     Rate and Rhythm: Normal rate.  Pulmonary:     Effort: Pulmonary effort is normal.  Musculoskeletal: Normal range of motion.  Skin:    General: Skin is warm and dry.  Neurological:     Mental Status: She is alert and oriented to person, place, and time.  Psychiatric:        Mood and Affect: Mood normal.        Behavior: Behavior normal.     RECENT LABS AND TESTS: BMET    Component Value Date/Time   NA 140 12/02/2017 0846   K 4.7 12/02/2017 0846   CL 99 12/02/2017 0846   CO2 25 12/02/2017 0846   GLUCOSE 94 12/02/2017 0846   BUN 16 12/02/2017 0846   CREATININE 1.00 12/02/2017 0846   CALCIUM 9.7 12/02/2017 0846   GFRNONAA 61 12/02/2017 0846   GFRAA 70 12/02/2017 0846   Lab Results  Component Value Date   HGBA1C 5.5 12/02/2017   Lab Results  Component Value Date   INSULIN 8.2 12/02/2017   CBC    Component Value Date/Time   WBC 6.3 12/02/2017 0846   WBC 8.5 07/19/2017 1535   RBC 4.63 12/02/2017 0846   RBC 4.93 07/19/2017 1535   HGB 13.9 12/02/2017 0846   HCT 42.5 12/02/2017 0846   PLT 230 04/29/2017 1146   MCV 92 12/02/2017 0846   MCH 30.0 12/02/2017 0846   MCH 29.3 07/19/2017 1535   MCHC 32.7 12/02/2017 0846   MCHC 32.7 07/19/2017 1535   RDW 13.2 12/02/2017 0846   LYMPHSABS 1.8 12/02/2017 0846   EOSABS 0.3 12/02/2017 0846   BASOSABS 0.1 12/02/2017 0846   Iron/TIBC/Ferritin/ %Sat No results found for: IRON, TIBC, FERRITIN, IRONPCTSAT Lipid Panel     Component Value Date/Time   CHOL 240 (H) 12/02/2017 0846   TRIG 132 12/02/2017 0846   HDL 88 12/02/2017 0846   CHOLHDL 2.6 04/29/2017 1146   LDLCALC 126 (H) 12/02/2017 0846   Hepatic Function Panel     Component Value Date/Time   PROT 6.9 12/02/2017 0846   ALBUMIN 4.6 12/02/2017 0846   AST 19 12/02/2017 0846   ALT 20 12/02/2017 0846   ALKPHOS 74 12/02/2017  0846   BILITOT 0.4 12/02/2017 0846      Component Value Date/Time   TSH 1.360 12/02/2017 0846   TSH 1.050 04/29/2017 1146   TSH 1.830 06/30/2016 1804  Results for Gabrielle Castro, Gabrielle Castro "Forde Radon" (MRN  [id number]) as of 01/25/2018 16:22  Ref. Range 12/02/2017 08:46  Vitamin D, 25-Hydroxy Latest Ref Range: 30.0 - 100.0 ng/mL 31.5    ASSESSMENT AND PLAN: Vitamin D deficiency - Plan: Vitamin D, Ergocalciferol, (DRISDOL) 1.25 MG (50000 UT) CAPS capsule  Insulin resistance  At risk for osteoporosis  Class 1 obesity with serious comorbidity and body mass index (BMI) of 32.0 to 32.9 in adult, unspecified obesity type  PLAN:  Vitamin D Deficiency Japji was informed that low vitamin D levels contributes to fatigue and are associated with obesity, breast, and colon cancer. Jannelly agrees to continue taking prescription Vit D @50 ,000 IU every week #4 and we will refill for 1 month. She will follow up for routine testing of vitamin D, at least 2-3 times per year. She was informed of the risk of over-replacement of vitamin D and agrees to not increase her dose unless she discusses this with [place] first. Annalina agrees to follow up with our clinic in 2 to 3 weeks.  At risk for osteopenia and osteoporosis Gabrielle Castro was given extended (15 minutes) osteoporosis prevention counseling today. Gabrielle Castro is at risk for osteopenia and osteoporsis due to her vitamin D deficiency. She was encouraged to take her vitamin D and follow her higher calcium diet and increase strengthening exercise to help strengthen her bones and decrease her risk of osteopenia and osteoporosis.  Insulin Resistance Gabrielle Castro will continue meal plan, and she will continue to work on weight loss, exercise, and decreasing simple carbohydrates in her diet to help decrease the risk of diabetes. We dicussed metformin including benefits and risks. Gabrielle Castro declined metformin for now and prescription was not written today. Gabrielle Castro agrees to follow  up with our clinic in 2 to 3 weeks as directed to monitor her progress.  Obesity Gabrielle Castro is currently in the action stage of change. As such, her goal is to continue with weight loss efforts She has agreed to follow the Category 2 plan + 100 calories Gabrielle Castro has not been prescribed exercise at this time. We discussed the following Behavioral Modification Strategies today: celebration eating strategies and planning for success   Gabrielle Castro has agreed to follow up with our clinic in 2 to 3 weeks. She was informed of the importance of frequent follow up visits to maximize her success with intensive lifestyle modifications for her multiple health conditions.   OBESITY BEHAVIORAL INTERVENTION VISIT  Today's visit was # 5  Starting weight: 190 lbs Starting date: 12/02/17 Today's weight : 184 lbs  Today's date: 01/25/2018 Total lbs lost to date: 6    ASK: We discussed the diagnosis of obesity with Gabrielle Castro today and Gabrielle Castro agreed to give [place] permission to discuss obesity behavioral modification therapy today.  ASSESS: Tranika has the diagnosis of obesity and her BMI today is 32.6 Hatice is in the action stage of change   ADVISE: Erleen was educated on the multiple health risks of obesity as well as the benefit of weight loss to improve her health. She was advised of the need for long term treatment and the importance of lifestyle modifications to improve her current health and to decrease her risk of future health problems.  AGREE: Multiple dietary modification options and treatment options were discussed and  Aune agreed to follow the recommendations documented in the above note.  ARRANGE: Fara was educated on the importance of frequent visits to treat obesity as outlined per CMS and USPSTF guidelines and agreed to schedule her next follow up appointment today.  Clide Dales  Hassell Done, am acting as Location manager for [employer], FNP-C.  I have reviewed the above  documentation for accuracy and completeness, and I agree with the above.  - Mykeria Garman, FNP-C.

## 2018-01-26 ENCOUNTER — Encounter (INDEPENDENT_AMBULATORY_CARE_PROVIDER_SITE_OTHER): Payer: Self-pay | Admitting: Family Medicine

## 2018-01-31 MED FILL — QVAR REDIHALER 40 MCG/ACT A: 40 | 30 days supply | Qty: 11 | Fill #9

## 2018-02-15 ENCOUNTER — Ambulatory Visit (INDEPENDENT_AMBULATORY_CARE_PROVIDER_SITE_OTHER): Payer: 59 | Admitting: Family Medicine

## 2018-02-15 ENCOUNTER — Encounter (INDEPENDENT_AMBULATORY_CARE_PROVIDER_SITE_OTHER): Payer: Self-pay | Admitting: Family Medicine

## 2018-02-15 VITALS — BP 135/80 | HR 85 | Temp 98.1°F | Ht 63.0 in | Wt 184.0 lb

## 2018-02-15 DIAGNOSIS — Z9189 Other specified personal risk factors, not elsewhere classified: Secondary | ICD-10-CM

## 2018-02-15 DIAGNOSIS — E559 Vitamin D deficiency, unspecified: Secondary | ICD-10-CM | POA: Diagnosis not present

## 2018-02-15 DIAGNOSIS — Z6832 Body mass index (BMI) 32.0-32.9, adult: Secondary | ICD-10-CM | POA: Diagnosis not present

## 2018-02-15 DIAGNOSIS — E669 Obesity, unspecified: Secondary | ICD-10-CM | POA: Diagnosis not present

## 2018-02-15 DIAGNOSIS — E8881 Metabolic syndrome: Secondary | ICD-10-CM

## 2018-02-15 MED ORDER — VITAMIN D (ERGOCALCIFEROL) 1.25 MG (50000 UNIT) PO CAPS: 50000.0000 [IU] | ORAL_CAPSULE | ORAL | 0 refills | Status: DC

## 2018-02-15 MED FILL — VIT D2 1.25 MG (50,000 UNIT: 1.25 MG | 28 days supply | Qty: 4 | Fill #0

## 2018-02-16 ENCOUNTER — Encounter (INDEPENDENT_AMBULATORY_CARE_PROVIDER_SITE_OTHER): Payer: Self-pay | Admitting: Family Medicine

## 2018-02-16 DIAGNOSIS — Z6833 Body mass index (BMI) 33.0-33.9, adult: Secondary | ICD-10-CM | POA: Insufficient documentation

## 2018-02-16 DIAGNOSIS — Z6832 Body mass index (BMI) 32.0-32.9, adult: Secondary | ICD-10-CM

## 2018-02-16 DIAGNOSIS — E669 Obesity, unspecified: Secondary | ICD-10-CM | POA: Insufficient documentation

## 2018-02-16 NOTE — Progress Notes (Signed)
Office: [phone]  /  Fax: [phone]   HPI:   Chief Complaint: OBESITY Gabrielle Castro is here to discuss her progress with her obesity treatment plan. She is on the Category 2 plan + 100 calories and is following her eating plan approximately 90 % of the time. She states she is exercising 0 minutes 0 times per week. Gabrielle Castro has "cheated" over the holidays, but she is happy with weight maintenance. She is back on the plan. Her mother broke her upper arm recently and she is having to do a lot for her mother who lives with her which has increased her stress level.  Her weight is 184 lb (83.5 kg) today and has not lost weight since her last visit. She has lost 6 lbs since starting treatment with [place].  Vitamin D deficiency Gabrielle Castro has a diagnosis of vitamin D deficiency. She is currently taking vit D and is not at goal. Her last vitamin D level was 31.5 on 12/02/17.   At risk for osteopenia and osteoporosis Gabrielle Castro is at higher risk of osteopenia and osteoporosis due to vitamin D deficiency.   Insulin Resistance Gabrielle Castro has a diagnosis of insulin resistance based on her elevated fasting insulin level >5. Although Gabrielle Castro's blood glucose readings are still under good control, insulin resistance puts her at greater risk of metabolic syndrome and diabetes. She is not taking metformin currently and continues to work on diet and exercise to decrease risk of diabetes. She denies polyphagia.  ASSESSMENT AND PLAN:  Vitamin D deficiency - Plan: Vitamin D, Ergocalciferol, (DRISDOL) 1.25 MG (50000 UT) CAPS capsule  Insulin resistance  At risk for osteoporosis  Class 1 obesity with serious comorbidity and body mass index (BMI) of 32.0 to 32.9 in adult, unspecified obesity type  PLAN:  Vitamin D Deficiency Gabrielle Castro was informed that low vitamin D levels contributes to fatigue and are associated with obesity, breast, and colon cancer. She agrees to continue to take prescription Vit D @50 ,000 IU  every week #4 with no refills and will follow up for routine testing of vitamin D, at least 2-3 times per year. She was informed of the risk of over-replacement of vitamin D and agrees to not increase her dose unless she discusses this with [place] first. Gabrielle Castro agrees to follow up in 2 weeks.  At risk for osteopenia and osteoporosis Gabrielle Castro was given extended (15 minutes) osteoporosis prevention counseling today. Gabrielle Castro is at risk for osteopenia and osteoporosis due to her vitamin D deficiency. She was encouraged to take her vitamin D and follow her higher calcium diet and increase strengthening exercise to help strengthen her bones and decrease her risk of osteopenia and osteoporosis.  Insulin Resistance Gabrielle Castro will continue to work on weight loss, exercise, and decreasing simple carbohydrates in her diet to help decrease the risk of diabetes. Gabrielle Castro agreed to continue with her meal plan and to follow up with [place] as directed to monitor her progress.  Obesity Gabrielle Castro is currently in the action stage of change. As such, her goal is to continue with weight loss efforts. She has agreed to follow the Category 2 plan + 100 calories. We discussed the following Behavioral Modification Strategies today: increasing lean protein intake and planning for success. Gabrielle Castro has not been prescribed exercise at this time. [name] has agreed to follow up with our clinic in 2 weeks. She was informed of the importance of frequent follow up visits to maximize her success with intensive lifestyle modifications for her multiple health conditions.  ALLERGIES:  Allergies  Allergen Reactions  . Nickel Swelling, Hives and Itching  . Tape Rash    MEDICATIONS: Current Outpatient Medications on File Prior to Visit  Medication Sig Dispense Refill  . albuterol (PROVENTIL HFA;VENTOLIN HFA) 108 (90 Base) MCG/ACT inhaler Inhale 2 puffs into the lungs every 4 (four) hours as needed for wheezing or shortness of breath (cough,  shortness of breath or wheezing.). 1 Inhaler 1  . Ascorbic Acid (VITAMIN C) 1000 MG tablet Take 1,000 mg by mouth daily.    . beclomethasone (QVAR) 40 MCG/ACT inhaler Inhale 2 puffs into the lungs 2 (two) times daily. 10.6 g 12  . Biotin 10000 MCG TABS Take 2 tablets by mouth daily.    . clonazePAM (KLONOPIN) 0.5 MG tablet Take 1 tablet (0.5 mg total) by mouth 2 (two) times daily as needed for anxiety. 30 tablet 0  . ferrous sulfate 325 (65 FE) MG tablet Take 325 mg by mouth every 3 (three) days.    Marland Kitchen Lysine 1000 MG TABS Take 1 tablet by mouth daily.    . SUPER B COMPLEX/C PO Take 2 capsules by mouth daily.     No current facility-administered medications on file prior to visit.     PAST MEDICAL HISTORY: Past Medical History:  Diagnosis Date  . Anxiety   . COPD (chronic obstructive pulmonary disease) ([place])   . Dyspnea   . EP (ectopic pregnancy)   . GERD (gastroesophageal reflux disease)     PAST SURGICAL HISTORY: Past Surgical History:  Procedure Laterality Date  . BREAST SURGERY    . EYE SURGERY    . HAND SURGERY    . KIDNEY DONATION    . KIDNEY DONATION    . TUBAL LIGATION      SOCIAL HISTORY: Social History   Tobacco Use  . Smoking status: Former Smoker    Packs/day: 1.00    Years: 41.00    Pack years: 41.00    Types: Cigarettes    Last attempt to quit: 03/03/2016    Years since quitting: 1.9  . Smokeless tobacco: Never Used  Substance Use Topics  . Alcohol use: Yes  . Drug use: Never    FAMILY HISTORY: Family History  Problem Relation Age of Onset  . Lung cancer Father        smoked  . Cancer Father        lung; +TOB  . Cancer Paternal Grandmother        "every kind of cancer"  . Heart disease Mother   . Diabetes Mother   . Hypertension Mother   . Hyperlipidemia Mother     ROS: Review of Systems  Constitutional: Negative for weight loss.  Endo/Heme/Allergies:       Negative for polyphagia.    PHYSICAL EXAM: Blood pressure 135/80, pulse 85,  temperature 98.1 F (36.7 C), temperature source Oral, height 5\' 3"  (1.6 m), weight 184 lb (83.5 kg), SpO2 99 %. Body mass index is 32.59 kg/m. Physical Exam Vitals signs reviewed.  Constitutional:      Appearance: Normal appearance. She is obese.  Cardiovascular:     Rate and Rhythm: Normal rate.  Pulmonary:     Effort: Pulmonary effort is normal.  Musculoskeletal: Normal range of motion.  Skin:    General: Skin is warm and dry.  Neurological:     Mental Status: She is alert and oriented to person, place, and time.  Psychiatric:        Mood and Affect: Mood normal.  Behavior: Behavior normal.     RECENT LABS AND TESTS: BMET    Component Value Date/Time   NA 140 12/02/2017 0846   K 4.7 12/02/2017 0846   CL 99 12/02/2017 0846   CO2 25 12/02/2017 0846   GLUCOSE 94 12/02/2017 0846   BUN 16 12/02/2017 0846   CREATININE 1.00 12/02/2017 0846   CALCIUM 9.7 12/02/2017 0846   GFRNONAA 61 12/02/2017 0846   GFRAA 70 12/02/2017 0846   Lab Results  Component Value Date   HGBA1C 5.5 12/02/2017   Lab Results  Component Value Date   INSULIN 8.2 12/02/2017   CBC    Component Value Date/Time   WBC 6.3 12/02/2017 0846   WBC 8.5 07/19/2017 1535   RBC 4.63 12/02/2017 0846   RBC 4.93 07/19/2017 1535   HGB 13.9 12/02/2017 0846   HCT 42.5 12/02/2017 0846   PLT 230 04/29/2017 1146   MCV 92 12/02/2017 0846   MCH 30.0 12/02/2017 0846   MCH 29.3 07/19/2017 1535   MCHC 32.7 12/02/2017 0846   MCHC 32.7 07/19/2017 1535   RDW 13.2 12/02/2017 0846   LYMPHSABS 1.8 12/02/2017 0846   EOSABS 0.3 12/02/2017 0846   BASOSABS 0.1 12/02/2017 0846   Iron/TIBC/Ferritin/ %Sat No results found for: IRON, TIBC, FERRITIN, IRONPCTSAT Lipid Panel     Component Value Date/Time   CHOL 240 (H) 12/02/2017 0846   TRIG 132 12/02/2017 0846   HDL 88 12/02/2017 0846   CHOLHDL 2.6 04/29/2017 1146   LDLCALC 126 (H) 12/02/2017 0846   Hepatic Function Panel     Component Value Date/Time   PROT  6.9 12/02/2017 0846   ALBUMIN 4.6 12/02/2017 0846   AST 19 12/02/2017 0846   ALT 20 12/02/2017 0846   ALKPHOS 74 12/02/2017 0846   BILITOT 0.4 12/02/2017 0846      Component Value Date/Time   TSH 1.360 12/02/2017 0846   TSH 1.050 04/29/2017 1146   TSH 1.830 06/30/2016 1804   Results for TAIJA, MATHIAS "BANEZA BARTOSZEK" (MRN [id number]) as of 02/16/2018 06:27  Ref. Range 12/02/2017 08:46  Vitamin D, 25-Hydroxy Latest Ref Range: 30.0 - 100.0 ng/mL 31.5    OBESITY BEHAVIORAL INTERVENTION VISIT  Today's visit was # 6   Starting weight: 190 lbs Starting date: 12/02/17 Today's weight : Weight: 184 lb (83.5 kg)  Today's date: 02/15/2018 Total lbs lost to date: 6  ASK: We discussed the diagnosis of obesity with Gabrielle Castro today and Gabrielle Castro agreed to give [place] permission to discuss obesity behavioral modification therapy today.  ASSESS: Kajol has the diagnosis of obesity and her BMI today is 32.6. Mehak is in the action stage of change.   ADVISE: Makaylee was educated on the multiple health risks of obesity as well as the benefit of weight loss to improve her health. She was advised of the need for long term treatment and the importance of lifestyle modifications to improve her current health and to decrease her risk of future health problems.  AGREE: Multiple dietary modification options and treatment options were discussed and Gabrielle Castro agreed to follow the recommendations documented in the above note.  ARRANGE: Willo was educated on the importance of frequent visits to treat obesity as outlined per CMS and USPSTF guidelines and agreed to schedule her next follow up appointment today.  I, Gabrielle Castro, am acting as Location manager for [employer], FNP-C.  I have reviewed the above documentation for accuracy and completeness, and I agree with the above.  - Gabrielle Whirley, FNP-C.

## 2018-02-24 MED FILL — QVAR REDIHALER 40 MCG/ACT A: 40 | 30 days supply | Qty: 11 | Fill #10

## 2018-02-28 ENCOUNTER — Encounter (INDEPENDENT_AMBULATORY_CARE_PROVIDER_SITE_OTHER): Payer: Self-pay | Admitting: Family Medicine

## 2018-02-28 ENCOUNTER — Ambulatory Visit (INDEPENDENT_AMBULATORY_CARE_PROVIDER_SITE_OTHER): Payer: 59 | Admitting: Family Medicine

## 2018-02-28 VITALS — BP 129/87 | HR 68 | Temp 97.5°F | Ht 63.0 in | Wt 188.0 lb

## 2018-02-28 DIAGNOSIS — E669 Obesity, unspecified: Secondary | ICD-10-CM

## 2018-02-28 DIAGNOSIS — E559 Vitamin D deficiency, unspecified: Secondary | ICD-10-CM

## 2018-02-28 DIAGNOSIS — Z6833 Body mass index (BMI) 33.0-33.9, adult: Secondary | ICD-10-CM

## 2018-02-28 NOTE — Progress Notes (Signed)
Office: [phone]  /  Fax: [phone]   HPI:   Chief Complaint: OBESITY Montine is here to discuss her progress with her obesity treatment plan. She is on the Category 2 plan +100 calories and is following her eating plan approximately 90 % of the time. She states she is doing Zumba, pilates and barre 90 minutes 1 times per week. Mariely has been having some indulgent foods recently (ice cream). She is bored with lunch choices. Her weight is 188 lb (85.3 kg) today and has gained 4 lbs since her last visit. She has lost 2 lbs since starting treatment with [place].  Vitamin D deficiency Cory has a diagnosis of vitamin D deficiency. She is currently taking prescription Vit D and denies nausea, vomiting or muscle weakness. Her vitamin D level is not at goal.  ASSESSMENT AND PLAN:  Vitamin D deficiency  Class 1 obesity with serious comorbidity and body mass index (BMI) of 33.0 to 33.9 in adult, unspecified obesity type  PLAN:  Vitamin D Deficiency Shonika was informed that low vitamin D levels contributes to fatigue and are associated with obesity, breast, and colon cancer. She agrees to continue to take prescription Vit D @50 ,000 IU every week and will follow up for routine testing of vitamin D, at least 2-3 times per year. She was informed of the risk of over-replacement of vitamin D and agrees to not increase her dose unless she discusses this with [place] first. We will check level at next visit. Naydeline agrees to follow up with our clinic in 2 weeks.  I spent > than 50% of the 15 minute visit on counseling as documented in the note.  Obesity Lanice is currently in the action stage of change. As such, her goal is to continue with weight loss efforts She has agreed to follow the Category 2 plan +100 calories Sharia will continue current exercise regimen for weight loss and overall health benefits. We discussed the following Behavioral Modification Strategies today: work on  meal planning and easy cooking plans and planning for success. The following handouts were provided to the patient: Seasonings, Category 2 Microwave Meals.  Melessia has agreed to follow up with our clinic in 2 weeks. She was informed of the importance of frequent follow up visits to maximize her success with intensive lifestyle modifications for her multiple health conditions.  ALLERGIES: Allergies  Allergen Reactions  . Nickel Swelling, Hives and Itching  . Tape Rash    MEDICATIONS: Current Outpatient Medications on File Prior to Visit  Medication Sig Dispense Refill  . albuterol (PROVENTIL HFA;VENTOLIN HFA) 108 (90 Base) MCG/ACT inhaler Inhale 2 puffs into the lungs every 4 (four) hours as needed for wheezing or shortness of breath (cough, shortness of breath or wheezing.). 1 Inhaler 1  . Ascorbic Acid (VITAMIN C) 1000 MG tablet Take 1,000 mg by mouth daily.    . beclomethasone (QVAR) 40 MCG/ACT inhaler Inhale 2 puffs into the lungs 2 (two) times daily. 10.6 g 12  . Biotin 10000 MCG TABS Take 2 tablets by mouth daily.    . clonazePAM (KLONOPIN) 0.5 MG tablet Take 1 tablet (0.5 mg total) by mouth 2 (two) times daily as needed for anxiety. 30 tablet 0  . ferrous sulfate 325 (65 FE) MG tablet Take 325 mg by mouth every 3 (three) days.    Marland Kitchen Lysine 1000 MG TABS Take 1 tablet by mouth daily.    . SUPER B COMPLEX/C PO Take 2 capsules by mouth daily.    Marland Kitchen  Vitamin D, Ergocalciferol, (DRISDOL) 1.25 MG (50000 UT) CAPS capsule Take 1 capsule (50,000 Units total) by mouth every 7 (seven) days. 4 capsule 0   No current facility-administered medications on file prior to visit.     PAST MEDICAL HISTORY: Past Medical History:  Diagnosis Date  . Anxiety   . COPD (chronic obstructive pulmonary disease) ([place])   . Dyspnea   . EP (ectopic pregnancy)   . GERD (gastroesophageal reflux disease)     PAST SURGICAL HISTORY: Past Surgical History:  Procedure Laterality Date  . BREAST SURGERY    . EYE  SURGERY    . HAND SURGERY    . KIDNEY DONATION    . KIDNEY DONATION    . TUBAL LIGATION      SOCIAL HISTORY: Social History   Tobacco Use  . Smoking status: Former Smoker    Packs/day: 1.00    Years: 41.00    Pack years: 41.00    Types: Cigarettes    Last attempt to quit: 03/03/2016    Years since quitting: 1.9  . Smokeless tobacco: Never Used  Substance Use Topics  . Alcohol use: Yes  . Drug use: Never    FAMILY HISTORY: Family History  Problem Relation Age of Onset  . Lung cancer Father        smoked  . Cancer Father        lung; +TOB  . Cancer Paternal Grandmother        "every kind of cancer"  . Heart disease Mother   . Diabetes Mother   . Hypertension Mother   . Hyperlipidemia Mother     ROS: Review of Systems  Constitutional: Negative for weight loss.  Gastrointestinal: Negative for nausea and vomiting.  Musculoskeletal:       Negative for muscle weakness    PHYSICAL EXAM: Blood pressure 129/87, pulse 68, temperature (!) 97.5 F (36.4 C), temperature source Oral, height 5\' 3"  (1.6 m), weight 188 lb (85.3 kg), SpO2 99 %. Body mass index is 33.3 kg/m. Physical Exam Vitals signs reviewed.  Constitutional:      Appearance: Normal appearance. She is obese.  Cardiovascular:     Rate and Rhythm: Normal rate.     Pulses: Normal pulses.  Pulmonary:     Effort: Pulmonary effort is normal.  Musculoskeletal: Normal range of motion.  Skin:    General: Skin is warm and dry.  Neurological:     Mental Status: She is alert and oriented to person, place, and time.  Psychiatric:        Mood and Affect: Mood normal.        Behavior: Behavior normal.     RECENT LABS AND TESTS: BMET    Component Value Date/Time   NA 140 12/02/2017 0846   K 4.7 12/02/2017 0846   CL 99 12/02/2017 0846   CO2 25 12/02/2017 0846   GLUCOSE 94 12/02/2017 0846   BUN 16 12/02/2017 0846   CREATININE 1.00 12/02/2017 0846   CALCIUM 9.7 12/02/2017 0846   GFRNONAA 61 12/02/2017  0846   GFRAA 70 12/02/2017 0846   Lab Results  Component Value Date   HGBA1C 5.5 12/02/2017   Lab Results  Component Value Date   INSULIN 8.2 12/02/2017   CBC    Component Value Date/Time   WBC 6.3 12/02/2017 0846   WBC 8.5 07/19/2017 1535   RBC 4.63 12/02/2017 0846   RBC 4.93 07/19/2017 1535   HGB 13.9 12/02/2017 0846   HCT 42.5 12/02/2017  0846   PLT 230 04/29/2017 1146   MCV 92 12/02/2017 0846   MCH 30.0 12/02/2017 0846   MCH 29.3 07/19/2017 1535   MCHC 32.7 12/02/2017 0846   MCHC 32.7 07/19/2017 1535   RDW 13.2 12/02/2017 0846   LYMPHSABS 1.8 12/02/2017 0846   EOSABS 0.3 12/02/2017 0846   BASOSABS 0.1 12/02/2017 0846   Iron/TIBC/Ferritin/ %Sat No results found for: IRON, TIBC, FERRITIN, IRONPCTSAT Lipid Panel     Component Value Date/Time   CHOL 240 (H) 12/02/2017 0846   TRIG 132 12/02/2017 0846   HDL 88 12/02/2017 0846   CHOLHDL 2.6 04/29/2017 1146   LDLCALC 126 (H) 12/02/2017 0846   Hepatic Function Panel     Component Value Date/Time   PROT 6.9 12/02/2017 0846   ALBUMIN 4.6 12/02/2017 0846   AST 19 12/02/2017 0846   ALT 20 12/02/2017 0846   ALKPHOS 74 12/02/2017 0846   BILITOT 0.4 12/02/2017 0846      Component Value Date/Time   TSH 1.360 12/02/2017 0846   TSH 1.050 04/29/2017 1146   TSH 1.830 06/30/2016 1804     Ref. Range 12/02/2017 08:46  Vitamin D, 25-Hydroxy Latest Ref Range: 30.0 - 100.0 ng/mL 31.5     OBESITY BEHAVIORAL INTERVENTION VISIT  Today's visit was # 7  Starting weight: 190 lbs Starting date: 12/02/2017 Today's weight :: 188 lbs Today's date: 02/28/2018 Total lbs lost to date: 2   ASK: We discussed the diagnosis of obesity with Jenny Reichmann today and Joelene Millin agreed to give [place] permission to discuss obesity behavioral modification therapy today.  ASSESS: Jodiann has the diagnosis of obesity and her BMI today is 33.31 Katti is in the action stage of change   ADVISE: Phebe was educated on the multiple health  risks of obesity as well as the benefit of weight loss to improve her health. She was advised of the need for long term treatment and the importance of lifestyle modifications to improve her current health and to decrease her risk of future health problems.  AGREE: Multiple dietary modification options and treatment options were discussed and  Nisa agreed to follow the recommendations documented in the above note.  ARRANGE: Shaeley was educated on the importance of frequent visits to treat obesity as outlined per CMS and USPSTF guidelines and agreed to schedule her next follow up appointment today.  I, Tammy Wysor, am acting as Location manager for [employer], FNP-C.  I have reviewed the above documentation for accuracy and completeness, and I agree with the above.  - Thalya Fouche, FNP-C.

## 2018-03-01 ENCOUNTER — Encounter (INDEPENDENT_AMBULATORY_CARE_PROVIDER_SITE_OTHER): Payer: Self-pay | Admitting: Family Medicine

## 2018-03-15 ENCOUNTER — Other Ambulatory Visit: Payer: Self-pay

## 2018-03-15 DIAGNOSIS — Z1231 Encounter for screening mammogram for malignant neoplasm of breast: Secondary | ICD-10-CM

## 2018-03-17 ENCOUNTER — Ambulatory Visit (INDEPENDENT_AMBULATORY_CARE_PROVIDER_SITE_OTHER): Payer: Commercial Managed Care - PPO | Admitting: Family Medicine

## 2018-03-17 ENCOUNTER — Encounter (INDEPENDENT_AMBULATORY_CARE_PROVIDER_SITE_OTHER): Payer: Self-pay | Admitting: Family Medicine

## 2018-03-17 VITALS — BP 112/68 | HR 67 | Temp 98.0°F | Ht 63.0 in | Wt 183.0 lb

## 2018-03-17 DIAGNOSIS — E669 Obesity, unspecified: Secondary | ICD-10-CM | POA: Diagnosis not present

## 2018-03-17 DIAGNOSIS — Z6832 Body mass index (BMI) 32.0-32.9, adult: Secondary | ICD-10-CM

## 2018-03-17 DIAGNOSIS — Z9189 Other specified personal risk factors, not elsewhere classified: Secondary | ICD-10-CM

## 2018-03-17 DIAGNOSIS — E7849 Other hyperlipidemia: Secondary | ICD-10-CM

## 2018-03-17 DIAGNOSIS — E559 Vitamin D deficiency, unspecified: Secondary | ICD-10-CM

## 2018-03-17 MED ORDER — VITAMIN D (ERGOCALCIFEROL) 1.25 MG (50000 UNIT) PO CAPS: 50000.0000 [IU] | ORAL_CAPSULE | ORAL | 0 refills | Status: DC

## 2018-03-17 MED FILL — VIT D2 1.25 MG (50,000 UNIT: 1.25 MG | 28 days supply | Qty: 4 | Fill #0

## 2018-03-17 NOTE — Progress Notes (Signed)
Office: [phone]  /  Fax: [phone]   HPI:   Chief Complaint: OBESITY Gabrielle Castro is here to discuss her progress with her obesity treatment plan. She is on the Category 2 plan +100 calories and is following her eating plan approximately 99 % of the time. She states she is bowling 90 minutes 1 times per week. Gabrielle Castro does not always eat all of her extra calories. Her weight is 183 lb (83 kg) today and has had a weight loss of 5 pounds over a period of 2 weeks since her last visit. She has lost 7 lbs since starting treatment with [place].  Vitamin D deficiency Gabrielle Castro has a diagnosis of vitamin D deficiency. She is not at goal. Her last Vit D level was 31.5 on 12/02/2017.She is currently taking prescription Vit D and denies nausea, vomiting or muscle weakness.   Hyperlipidemia Gabrielle Castro has hyperlipidemia and has been trying to improve her cholesterol levels with intensive lifestyle modification including a low saturated fat diet, exercise and weight loss. She denies any chest pain or shortness of breath. Her last LDL was 126 and her last HDL and Triglycerides were normal. Gabrielle Castro has a ASCVD score of 2.4%. She is not on a statin. The 10-year ASCVD risk score Gabrielle Bussing DC Brooke Bonito., et al., 2013) is: 2.5%   Values used to calculate the score:     Age: 63 years     Sex: Female     Is Non-Hispanic African American: No     Diabetic: No     Tobacco smoker: No     Systolic Blood Pressure: 664 mmHg     Is BP treated: No     HDL Cholesterol: 70 mg/dL     Total Cholesterol: 203 mg/dL   At risk for osteopenia and osteoporosis Gabrielle Castro is at higher risk of osteopenia and osteoporosis due to vitamin D deficiency.   ASSESSMENT AND PLAN:  Vitamin D deficiency - Plan: Vitamin D, Ergocalciferol, (DRISDOL) 1.25 MG (50000 UT) CAPS capsule, VITAMIN D 25 Hydroxy (Vit-D Deficiency, Fractures), CANCELED: Vitamin D (25 hydroxy)  Other hyperlipidemia - Plan: Lipid Panel With LDL/HDL Ratio, CANCELED: Lipid  Profile  At risk for osteoporosis  Class 1 obesity with serious comorbidity and body mass index (BMI) of 32.0 to 32.9 in adult, unspecified obesity type  PLAN:  Vitamin D Deficiency Gabrielle Castro was informed that low vitamin D levels contributes to fatigue and are associated with obesity, breast, and colon cancer. She agrees to continue to take prescription Vit D 50,000 IU every week #4 with no refills and will follow up for routine testing of vitamin D, at least 2-3 times per year. She was informed of the risk of over-replacement of vitamin D and agrees to not increase her dose unless she discusses this with [place] first. Gabrielle Castro agrees to follow up with our clinic in 2 weeks.  Hyperlipidemia Gabrielle Castro was informed of the American Heart Association Guidelines emphasizing intensive lifestyle modifications as the first line treatment for hyperlipidemia. We discussed many lifestyle modifications today in depth, and Gabrielle Castro will continue to work on decreasing saturated fats such as fatty red meat, butter and many fried foods. She will also increase vegetables and lean protein in her diet and continue to work on exercise and weight loss efforts. We will check labs today. Gabrielle Castro agrees to follow up with our clinic in 2 weeks.  At risk for osteopenia and osteoporosis Gabrielle Castro was given extended  (15 minutes) osteoporosis prevention counseling today. Gabrielle Castro is at risk for osteopenia  and osteoporosis due to her vitamin D deficiency. She was encouraged to take her vitamin D and follow her higher calcium diet and increase strengthening exercise to help strengthen her bones and decrease her risk of osteopenia and osteoporosis.  Obesity Gabrielle Castro is currently in the action stage of change. As such, her goal is to continue with weight loss efforts She has agreed to follow the Category 2 plan +100 calories Gabrielle Castro has been instructed to continue bowling for 90 minutes 1 time per week for weight loss and overall  health benefits. We discussed the following Behavioral Modification Strategies today: work on meal planning and easy cooking plans, better snacking choices and planning for success. Gabrielle Castro has been instructed to eat all food on her plan including snack calories. Handout was given, (Seasoning).  Gabrielle Castro has agreed to follow up with our clinic in 2 weeks. She was informed of the importance of frequent follow up visits to maximize her success with intensive lifestyle modifications for her multiple health conditions.  ALLERGIES: Allergies  Allergen Reactions  . Nickel Swelling, Hives and Itching  . Tape Rash    MEDICATIONS: Current Outpatient Medications on File Prior to Visit  Medication Sig Dispense Refill  . albuterol (PROVENTIL HFA;VENTOLIN HFA) 108 (90 Base) MCG/ACT inhaler Inhale 2 puffs into the lungs every 4 (four) hours as needed for wheezing or shortness of breath (cough, shortness of breath or wheezing.). 1 Inhaler 1  . Ascorbic Acid (VITAMIN C) 1000 MG tablet Take 1,000 mg by mouth daily.    . beclomethasone (QVAR) 40 MCG/ACT inhaler Inhale 2 puffs into the lungs 2 (two) times daily. 10.6 g 12  . Biotin 10000 MCG TABS Take 2 tablets by mouth daily.    . Calcium 600-200 MG-UNIT tablet Take 1 tablet by mouth daily.    . clonazePAM (KLONOPIN) 0.5 MG tablet Take 1 tablet (0.5 mg total) by mouth 2 (two) times daily as needed for anxiety. 30 tablet 0  . ferrous sulfate 325 (65 FE) MG tablet Take 325 mg by mouth every 3 (three) days.    Marland Kitchen Lysine 1000 MG TABS Take 1 tablet by mouth daily.    . SUPER B COMPLEX/C PO Take 2 capsules by mouth daily.     No current facility-administered medications on file prior to visit.     PAST MEDICAL HISTORY: Past Medical History:  Diagnosis Date  . Anxiety   . COPD (chronic obstructive pulmonary disease) ([place])   . Dyspnea   . EP (ectopic pregnancy)   . GERD (gastroesophageal reflux disease)     PAST SURGICAL HISTORY: Past Surgical History:    Procedure Laterality Date  . BREAST SURGERY    . EYE SURGERY    . HAND SURGERY    . KIDNEY DONATION    . KIDNEY DONATION    . TUBAL LIGATION      SOCIAL HISTORY: Social History   Tobacco Use  . Smoking status: Former Smoker    Packs/day: 1.00    Years: 41.00    Pack years: 41.00    Types: Cigarettes    Last attempt to quit: 03/03/2016    Years since quitting: 2.0  . Smokeless tobacco: Never Used  Substance Use Topics  . Alcohol use: Yes  . Drug use: Never    FAMILY HISTORY: Family History  Problem Relation Age of Onset  . Lung cancer Father        smoked  . Cancer Father        lung; +TOB  .  Cancer Paternal Grandmother        "every kind of cancer"  . Heart disease Mother   . Diabetes Mother   . Hypertension Mother   . Hyperlipidemia Mother     ROS: Review of Systems  Constitutional: Positive for weight loss.  Respiratory: Negative for shortness of breath.   Cardiovascular: Negative for chest pain.  Gastrointestinal: Negative for nausea and vomiting.  Musculoskeletal:       Negative for muscle weakness    PHYSICAL EXAM: Blood pressure 112/68, pulse 67, temperature 98 F (36.7 C), temperature source Oral, height 5\' 3"  (1.6 m), weight 183 lb (83 kg), SpO2 97 %. Body mass index is 32.42 kg/m. Physical Exam Vitals signs reviewed.  Constitutional:      Appearance: Normal appearance. She is obese.  Cardiovascular:     Rate and Rhythm: Normal rate.     Pulses: Normal pulses.  Pulmonary:     Effort: Pulmonary effort is normal.  Musculoskeletal: Normal range of motion.  Skin:    General: Skin is warm and dry.  Neurological:     Mental Status: She is alert and oriented to person, place, and time.  Psychiatric:        Mood and Affect: Mood normal.        Behavior: Behavior normal.     RECENT LABS AND TESTS: BMET    Component Value Date/Time   NA 140 12/02/2017 0846   K 4.7 12/02/2017 0846   CL 99 12/02/2017 0846   CO2 25 12/02/2017 0846    GLUCOSE 94 12/02/2017 0846   BUN 16 12/02/2017 0846   CREATININE 1.00 12/02/2017 0846   CALCIUM 9.7 12/02/2017 0846   GFRNONAA 61 12/02/2017 0846   GFRAA 70 12/02/2017 0846   Lab Results  Component Value Date   HGBA1C 5.5 12/02/2017   Lab Results  Component Value Date   INSULIN 8.2 12/02/2017   CBC    Component Value Date/Time   WBC 6.3 12/02/2017 0846   WBC 8.5 07/19/2017 1535   RBC 4.63 12/02/2017 0846   RBC 4.93 07/19/2017 1535   HGB 13.9 12/02/2017 0846   HCT 42.5 12/02/2017 0846   PLT 230 04/29/2017 1146   MCV 92 12/02/2017 0846   MCH 30.0 12/02/2017 0846   MCH 29.3 07/19/2017 1535   MCHC 32.7 12/02/2017 0846   MCHC 32.7 07/19/2017 1535   RDW 13.2 12/02/2017 0846   LYMPHSABS 1.8 12/02/2017 0846   EOSABS 0.3 12/02/2017 0846   BASOSABS 0.1 12/02/2017 0846   Iron/TIBC/Ferritin/ %Sat No results found for: IRON, TIBC, FERRITIN, IRONPCTSAT Lipid Panel     Component Value Date/Time   CHOL 240 (H) 12/02/2017 0846   TRIG 132 12/02/2017 0846   HDL 88 12/02/2017 0846   CHOLHDL 2.6 04/29/2017 1146   LDLCALC 126 (H) 12/02/2017 0846   Hepatic Function Panel     Component Value Date/Time   PROT 6.9 12/02/2017 0846   ALBUMIN 4.6 12/02/2017 0846   AST 19 12/02/2017 0846   ALT 20 12/02/2017 0846   ALKPHOS 74 12/02/2017 0846   BILITOT 0.4 12/02/2017 0846      Component Value Date/Time   TSH 1.360 12/02/2017 0846   TSH 1.050 04/29/2017 1146   TSH 1.830 06/30/2016 1804    Ref. Range 12/02/2017 08:46  Vitamin D, 25-Hydroxy Latest Ref Range: 30.0 - 100.0 ng/mL 31.5     OBESITY BEHAVIORAL INTERVENTION VISIT  Today's visit was # 8   Starting weight: 190 lbs Starting date: 12/02/2017  Today's weight :183 lbs Today's date: 03/17/2018 Total lbs lost to date: 7   ASK: We discussed the diagnosis of obesity with Gabrielle Castro today and Gabrielle Castro agreed to give [place] permission to discuss obesity behavioral modification therapy today.  ASSESS: Danaya has the  diagnosis of obesity and her BMI today is 32.42 Kennya is in the action stage of change   ADVISE: Tanyika was educated on the multiple health risks of obesity as well as the benefit of weight loss to improve her health. She was advised of the need for long term treatment and the importance of lifestyle modifications to improve her current health and to decrease her risk of future health problems.  AGREE: Multiple dietary modification options and treatment options were discussed and  Estefani agreed to follow the recommendations documented in the above note.  ARRANGE: Shirline was educated on the importance of frequent visits to treat obesity as outlined per CMS and USPSTF guidelines and agreed to schedule her next follow up appointment today.  I, Tammy Wysor, am acting as Location manager for [employer], FNP-C.  I have reviewed the above documentation for accuracy and completeness, and I agree with the above.  - Antia Rahal, FNP-C.

## 2018-03-18 LAB — LIPID PANEL WITH LDL/HDL RATIO
Cholesterol, Total: 203 mg/dL — ABNORMAL HIGH (ref 100–199)
HDL: 70 mg/dL (ref >39)
LDL Calculated: 120 mg/dL — ABNORMAL HIGH (ref 0–99)
LDl/HDL Ratio: 1.7 ratio (ref 0.0–3.2)
Triglycerides: 66 mg/dL (ref 0–149)
VLDL Cholesterol Cal: 13 mg/dL (ref 5–40)

## 2018-03-18 LAB — VITAMIN D 25 HYDROXY (VIT D DEFICIENCY, FRACTURES): Vit D, 25-Hydroxy: 44.6 ng/mL (ref 30.0–100.0)

## 2018-03-21 ENCOUNTER — Encounter (INDEPENDENT_AMBULATORY_CARE_PROVIDER_SITE_OTHER): Payer: Self-pay | Admitting: Family Medicine

## 2018-03-21 DIAGNOSIS — E782 Mixed hyperlipidemia: Secondary | ICD-10-CM | POA: Insufficient documentation

## 2018-03-21 DIAGNOSIS — E7849 Other hyperlipidemia: Secondary | ICD-10-CM | POA: Insufficient documentation

## 2018-03-29 MED FILL — QVAR REDIHALER 40 MCG/ACT A: 40 | 30 days supply | Qty: 11 | Fill #11

## 2018-04-04 ENCOUNTER — Encounter (INDEPENDENT_AMBULATORY_CARE_PROVIDER_SITE_OTHER): Payer: Self-pay | Admitting: Family Medicine

## 2018-04-04 ENCOUNTER — Ambulatory Visit (INDEPENDENT_AMBULATORY_CARE_PROVIDER_SITE_OTHER): Payer: 59 | Admitting: Family Medicine

## 2018-04-04 VITALS — BP 122/77 | HR 72 | Temp 98.2°F | Ht 63.0 in | Wt 185.0 lb

## 2018-04-04 DIAGNOSIS — E7849 Other hyperlipidemia: Secondary | ICD-10-CM | POA: Diagnosis not present

## 2018-04-04 DIAGNOSIS — E559 Vitamin D deficiency, unspecified: Secondary | ICD-10-CM | POA: Diagnosis not present

## 2018-04-04 DIAGNOSIS — Z9189 Other specified personal risk factors, not elsewhere classified: Secondary | ICD-10-CM

## 2018-04-04 DIAGNOSIS — Z6832 Body mass index (BMI) 32.0-32.9, adult: Secondary | ICD-10-CM | POA: Diagnosis not present

## 2018-04-04 DIAGNOSIS — E669 Obesity, unspecified: Secondary | ICD-10-CM | POA: Diagnosis not present

## 2018-04-04 MED ORDER — VITAMIN D (ERGOCALCIFEROL) 1.25 MG (50000 UNIT) PO CAPS: 50000.0000 [IU] | ORAL_CAPSULE | ORAL | 0 refills | Status: DC

## 2018-04-04 MED FILL — VIT D2 1.25 MG (50,000 UNIT: 1.25 MG | 28 days supply | Qty: 4 | Fill #0

## 2018-04-04 NOTE — Progress Notes (Signed)
Office: [phone]  /  Fax: [phone]   HPI:   Chief Complaint: OBESITY Gabrielle Castro is here to discuss her progress with her obesity treatment plan. She is on the Category 2 plan + 100 calories and is following her eating plan approximately 100 % of the time. She states she is bowling for 60 minutes 1 time per week. Gabrielle Castro's weight is plateaued. She is eating all of the protein on the plan. She is frustrated with lack of weight loss.  Her weight is 185 lb (83.9 kg) today and has gained 2 pounds since her last visit. She has lost 5 lbs since starting treatment with [place].  Vitamin D Deficiency Gabrielle Castro has a diagnosis of vitamin D deficiency. She is currently taking prescription Vit D, but level is not at goal. Last Vit D level was 44.6 on 03/17/2018. She denies nausea, vomiting or muscle weakness.  Hyperlipidemia Gabrielle Castro has hyperlipidemia and has been trying to improve her cholesterol levels with intensive lifestyle modification including a low saturated fat diet, exercise and weight loss. Her ASCVD risk score is 2.9%. she is not on statin and she is concerned about elevated LDL with family history of coronary artery disease. She denies any chest pain or shortness of breath. The 10-year ASCVD risk score Gabrielle Bussing DC Brooke Bonito., et al., 2013) is: 2.9%   Values used to calculate the score:     Age: 63 years     Sex: Female     Is Non-Hispanic African American: No     Diabetic: No     Tobacco smoker: No     Systolic Blood Pressure: 295 mmHg     Is BP treated: No     HDL Cholesterol: 70 mg/dL     Total Cholesterol: 203 mg/dL   At risk for cardiovascular disease [place] is at a higher than average risk for cardiovascular disease due to obesity and hyperlipidemia. She currently denies any chest pain.  ASSESSMENT AND PLAN:  Vitamin D deficiency - Plan: Vitamin D, Ergocalciferol, (DRISDOL) 1.25 MG (50000 UT) CAPS capsule  Other hyperlipidemia  At risk for heart disease  Class 1 obesity with  serious comorbidity and body mass index (BMI) of 32.0 to 32.9 in adult, unspecified obesity type  PLAN:  Vitamin D Deficiency Gabrielle Castro was informed that low vitamin D levels contributes to fatigue and are associated with obesity, breast, and colon cancer. Gabrielle Castro agrees to continue taking prescription Vit D @50 ,000 IU every week #4 and we will refill for 1 month. She will follow up for routine testing of vitamin D, at least 2-3 times per year. She was informed of the risk of over-replacement of vitamin D and agrees to not increase her dose unless she discusses this with [place] first. Gabrielle Castro agrees to follow up with our clinic in 4 weeks.  Hyperlipidemia Gabrielle Castro was informed of the American Heart Association Guidelines emphasizing intensive lifestyle modifications as the first line treatment for hyperlipidemia. We discussed many lifestyle modifications today in depth, and Gabrielle Castro will continue to work on decreasing saturated fats such as fatty red meat, butter and many fried foods. She will also increase vegetables and lean protein in her diet and continue to increase exercise and work on weight loss efforts. Gabrielle Castro agrees to follow up with our clinic in 4 weeks.  Cardiovascular risk counseling Gabrielle Castro was given extended (15 minutes) coronary artery disease prevention counseling today. She is 63 y.o. female and has risk factors for heart disease including obesity and hyperlipidemia. We discussed intensive lifestyle  modifications today with an emphasis on specific weight loss instructions and strategies. Pt was also informed of the importance of increasing exercise and decreasing saturated fats to help prevent heart disease.  Obesity Gabrielle Castro is currently in the action stage of change. As such, her goal is to continue with weight loss efforts She has agreed to follow the Category 2 plan + 100 calories Gabrielle Castro has been instructed to add zumba once weekly and walk for 30 minutes 1 time per week for  weight loss and overall health benefits. We discussed the following Behavioral Modification Strategies today: planning for success Gabrielle Castro is to decrease saturated fat.  Gabrielle Castro has agreed to follow up with our clinic in 4 weeks. She was informed of the importance of frequent follow up visits to maximize her success with intensive lifestyle modifications for her multiple health conditions.  ALLERGIES: Allergies  Allergen Reactions  . Nickel Swelling, Hives and Itching  . Tape Rash    MEDICATIONS: Current Outpatient Medications on File Prior to Visit  Medication Sig Dispense Refill  . albuterol (PROVENTIL HFA;VENTOLIN HFA) 108 (90 Base) MCG/ACT inhaler Inhale 2 puffs into the lungs every 4 (four) hours as needed for wheezing or shortness of breath (cough, shortness of breath or wheezing.). 1 Inhaler 1  . Ascorbic Acid (VITAMIN C) 1000 MG tablet Take 1,000 mg by mouth daily.    . beclomethasone (QVAR) 40 MCG/ACT inhaler Inhale 2 puffs into the lungs 2 (two) times daily. 10.6 g 12  . Biotin 10000 MCG TABS Take 2 tablets by mouth daily.    . Calcium 600-200 MG-UNIT tablet Take 1 tablet by mouth daily.    . clonazePAM (KLONOPIN) 0.5 MG tablet Take 1 tablet (0.5 mg total) by mouth 2 (two) times daily as needed for anxiety. 30 tablet 0  . ferrous sulfate 325 (65 FE) MG tablet Take 325 mg by mouth every 3 (three) days.    Marland Kitchen Lysine 1000 MG TABS Take 1 tablet by mouth daily.    . SUPER B COMPLEX/C PO Take 2 capsules by mouth daily.     No current facility-administered medications on file prior to visit.     PAST MEDICAL HISTORY: Past Medical History:  Diagnosis Date  . Anxiety   . COPD (chronic obstructive pulmonary disease) ([place])   . Dyspnea   . EP (ectopic pregnancy)   . GERD (gastroesophageal reflux disease)     PAST SURGICAL HISTORY: Past Surgical History:  Procedure Laterality Date  . BREAST SURGERY    . EYE SURGERY    . HAND SURGERY    . KIDNEY DONATION    . KIDNEY  DONATION    . TUBAL LIGATION      SOCIAL HISTORY: Social History   Tobacco Use  . Smoking status: Former Smoker    Packs/day: 1.00    Years: 41.00    Pack years: 41.00    Types: Cigarettes    Last attempt to quit: 03/03/2016    Years since quitting: 2.0  . Smokeless tobacco: Never Used  Substance Use Topics  . Alcohol use: Yes  . Drug use: Never    FAMILY HISTORY: Family History  Problem Relation Age of Onset  . Lung cancer Father        smoked  . Cancer Father        lung; +TOB  . Cancer Paternal Grandmother        "every kind of cancer"  . Heart disease Mother   . Diabetes Mother   .  Hypertension Mother   . Hyperlipidemia Mother     ROS: Review of Systems  Constitutional: Negative for weight loss.  Respiratory: Negative for shortness of breath.   Cardiovascular: Negative for chest pain.  Gastrointestinal: Negative for nausea and vomiting.  Musculoskeletal:       Negative muscle weakness    PHYSICAL EXAM: Blood pressure 122/77, pulse 72, temperature 98.2 F (36.8 C), temperature source Oral, height 5\' 3"  (1.6 m), weight 185 lb (83.9 kg), SpO2 96 %. Body mass index is 32.77 kg/m. Physical Exam Vitals signs reviewed.  Constitutional:      Appearance: Normal appearance. She is obese.  Cardiovascular:     Rate and Rhythm: Normal rate.     Pulses: Normal pulses.  Pulmonary:     Effort: Pulmonary effort is normal.     Breath sounds: Normal breath sounds.  Musculoskeletal: Normal range of motion.  Skin:    General: Skin is warm and dry.  Neurological:     Mental Status: She is alert and oriented to person, place, and time.  Psychiatric:        Mood and Affect: Mood normal.        Behavior: Behavior normal.     RECENT LABS AND TESTS: BMET    Component Value Date/Time   NA 140 12/02/2017 0846   K 4.7 12/02/2017 0846   CL 99 12/02/2017 0846   CO2 25 12/02/2017 0846   GLUCOSE 94 12/02/2017 0846   BUN 16 12/02/2017 0846   CREATININE 1.00  12/02/2017 0846   CALCIUM 9.7 12/02/2017 0846   GFRNONAA 61 12/02/2017 0846   GFRAA 70 12/02/2017 0846   Lab Results  Component Value Date   HGBA1C 5.5 12/02/2017   Lab Results  Component Value Date   INSULIN 8.2 12/02/2017   CBC    Component Value Date/Time   WBC 6.3 12/02/2017 0846   WBC 8.5 07/19/2017 1535   RBC 4.63 12/02/2017 0846   RBC 4.93 07/19/2017 1535   HGB 13.9 12/02/2017 0846   HCT 42.5 12/02/2017 0846   PLT 230 04/29/2017 1146   MCV 92 12/02/2017 0846   MCH 30.0 12/02/2017 0846   MCH 29.3 07/19/2017 1535   MCHC 32.7 12/02/2017 0846   MCHC 32.7 07/19/2017 1535   RDW 13.2 12/02/2017 0846   LYMPHSABS 1.8 12/02/2017 0846   EOSABS 0.3 12/02/2017 0846   BASOSABS 0.1 12/02/2017 0846   Iron/TIBC/Ferritin/ %Sat No results found for: IRON, TIBC, FERRITIN, IRONPCTSAT Lipid Panel     Component Value Date/Time   CHOL 203 (H) 03/17/2018 0932   TRIG 66 03/17/2018 0932   HDL 70 03/17/2018 0932   CHOLHDL 2.6 04/29/2017 1146   LDLCALC 120 (H) 03/17/2018 0932   Hepatic Function Panel     Component Value Date/Time   PROT 6.9 12/02/2017 0846   ALBUMIN 4.6 12/02/2017 0846   AST 19 12/02/2017 0846   ALT 20 12/02/2017 0846   ALKPHOS 74 12/02/2017 0846   BILITOT 0.4 12/02/2017 0846      Component Value Date/Time   TSH 1.360 12/02/2017 0846   TSH 1.050 04/29/2017 1146   TSH 1.830 06/30/2016 1804      OBESITY BEHAVIORAL INTERVENTION VISIT  Today's visit was # 9   Starting weight: 190 lbs Starting date: 12/02/17 Today's weight : 185 lbs  Today's date: 04/04/2018 Total lbs lost to date: 5   ASK: We discussed the diagnosis of obesity with Jenny Castro today and Joelene Millin agreed to give [place] permission to discuss  obesity behavioral modification therapy today.  ASSESS: Chonte has the diagnosis of obesity and her BMI today is 32.78 Nastassja is in the action stage of change   ADVISE: Danaija was educated on the multiple health risks of obesity as well as  the benefit of weight loss to improve her health. She was advised of the need for long term treatment and the importance of lifestyle modifications to improve her current health and to decrease her risk of future health problems.  AGREE: Multiple dietary modification options and treatment options were discussed and  Crystalina agreed to follow the recommendations documented in the above note.  ARRANGE: Tyteanna was educated on the importance of frequent visits to treat obesity as outlined per CMS and USPSTF guidelines and agreed to schedule her next follow up appointment today.  Wilhemena Durie, am acting as Location manager for [employer], FNP-C.  I have reviewed the above documentation for accuracy and completeness, and I agree with the above.  - Darrelyn Morro, FNP-C.

## 2018-04-06 ENCOUNTER — Encounter (INDEPENDENT_AMBULATORY_CARE_PROVIDER_SITE_OTHER): Payer: Self-pay | Admitting: Family Medicine

## 2018-04-26 MED FILL — QVAR REDIHALER 40 MCG/ACT A: 40 | 30 days supply | Qty: 11 | Fill #12

## 2018-05-02 ENCOUNTER — Ambulatory Visit (INDEPENDENT_AMBULATORY_CARE_PROVIDER_SITE_OTHER): Payer: 59 | Admitting: Family Medicine

## 2018-05-02 ENCOUNTER — Encounter (INDEPENDENT_AMBULATORY_CARE_PROVIDER_SITE_OTHER): Payer: Self-pay | Admitting: Family Medicine

## 2018-05-02 ENCOUNTER — Encounter: Payer: Self-pay | Admitting: Family Medicine

## 2018-05-02 DIAGNOSIS — J449 Chronic obstructive pulmonary disease, unspecified: Secondary | ICD-10-CM

## 2018-05-02 MED ORDER — BECLOMETHASONE DIPROP HFA 40 MCG/ACT IN AERB: 2.0000 | INHALATION_SPRAY | Freq: Two times a day (BID) | RESPIRATORY_TRACT | 12 refills | Status: DC

## 2018-05-04 ENCOUNTER — Encounter: Payer: Self-pay | Admitting: Family Medicine

## 2018-05-05 ENCOUNTER — Encounter (INDEPENDENT_AMBULATORY_CARE_PROVIDER_SITE_OTHER): Payer: Self-pay

## 2018-05-06 MED FILL — QVAR REDIHALER 40 MCG/ACT A: 40 | 30 days supply | Qty: 11 | Fill #0

## 2018-05-16 ENCOUNTER — Encounter (INDEPENDENT_AMBULATORY_CARE_PROVIDER_SITE_OTHER): Payer: Self-pay

## 2018-05-16 ENCOUNTER — Ambulatory Visit (INDEPENDENT_AMBULATORY_CARE_PROVIDER_SITE_OTHER): Payer: 59 | Admitting: Family Medicine

## 2018-06-07 ENCOUNTER — Ambulatory Visit (INDEPENDENT_AMBULATORY_CARE_PROVIDER_SITE_OTHER): Payer: 59 | Admitting: Family Medicine

## 2018-06-07 ENCOUNTER — Encounter (INDEPENDENT_AMBULATORY_CARE_PROVIDER_SITE_OTHER): Payer: Self-pay | Admitting: Family Medicine

## 2018-06-07 ENCOUNTER — Other Ambulatory Visit: Payer: Self-pay

## 2018-06-07 DIAGNOSIS — E88819 Insulin resistance, unspecified: Secondary | ICD-10-CM

## 2018-06-07 DIAGNOSIS — Z6832 Body mass index (BMI) 32.0-32.9, adult: Secondary | ICD-10-CM

## 2018-06-07 DIAGNOSIS — E559 Vitamin D deficiency, unspecified: Secondary | ICD-10-CM | POA: Diagnosis not present

## 2018-06-07 DIAGNOSIS — E669 Obesity, unspecified: Secondary | ICD-10-CM

## 2018-06-07 DIAGNOSIS — E8881 Metabolic syndrome: Secondary | ICD-10-CM | POA: Diagnosis not present

## 2018-06-07 MED ORDER — VITAMIN D (ERGOCALCIFEROL) 1.25 MG (50000 UNIT) PO CAPS: 50000.0000 [IU] | ORAL_CAPSULE | ORAL | 0 refills | Status: DC

## 2018-06-07 MED FILL — VIT D2 1.25 MG (50,000 UNIT: 1.25 MG | 28 days supply | Qty: 4 | Fill #0

## 2018-06-08 ENCOUNTER — Encounter (INDEPENDENT_AMBULATORY_CARE_PROVIDER_SITE_OTHER): Payer: Self-pay | Admitting: Family Medicine

## 2018-06-08 NOTE — Progress Notes (Signed)
Office: [phone]  /  Fax: [phone] TeleHealth Visit:  Gabrielle Castro has verbally consented to this TeleHealth visit today. The patient is located at home, the provider is located at the [facility] office. The participants in this visit include the listed provider and patient. The visit was conducted today via FaceTime.  HPI:   Chief Complaint: OBESITY Gabrielle Castro is here to discuss her progress with her obesity treatment plan. She is on the Category 2 plan + 3 100-calorie snacks and is following her eating plan approximately 75-80% of the time. She states she is exercising 0 minutes 0 times per week. Gabrielle Castro's last office visit was 04/04/2018. She states she weighed 185 lbs on her home scales. She reports stress eating and has gained a few lbs but does state she is meeting her protein goals. We were unable to weigh the patient today for this TeleHealth visit. She feels as if she has gained 2 lbs since her last visit. She has lost 5 lbs since starting treatment with [place].  Vitamin D deficiency Gabrielle Castro has a diagnosis of Vitamin D deficiency, which is not at goal. Her last Vitamin D level was reported to be 44.6 on 03/17/2018. She is currently taking prescription Vit D and denies nausea, vomiting or muscle weakness.  Insulin Resistance Gabrielle Castro has a diagnosis of insulin resistance based on her elevated fasting insulin level >5. Although Gabrielle Castro's blood glucose readings are still under good control, insulin resistance puts her at greater risk of metabolic syndrome and diabetes. She is not taking metformin currently and continues to work on diet and exercise to decrease risk of diabetes. She denies polyphagia if she sticks to the plan well. Lab Results  Component Value Date   HGBA1C 5.5 12/02/2017    ASSESSMENT AND PLAN:  Vitamin D deficiency - Plan: Vitamin D, Ergocalciferol, (DRISDOL) 1.25 MG (50000 UT) CAPS capsule  Insulin resistance  Class 1 obesity with  serious comorbidity and body mass index (BMI) of 32.0 to 32.9 in adult, unspecified obesity type  PLAN:  Vitamin D Deficiency Gabrielle Castro was informed that low Vitamin D levels contributes to fatigue and are associated with obesity, breast, and colon cancer. She agrees to continue to take prescription Vit D @ 50,000 IU every week #4 with 0 refills and will follow-up for routine testing of Vitamin D, at least 2-3 times per year. She was informed of the risk of over-replacement of Vitamin D and agrees to not increase her dose unless she discusses this with [place] first. Gabrielle Castro agrees to follow-up with our clinic in 2 weeks.  Insulin Resistance Gabrielle Castro will continue to work on weight loss, exercise, and decreasing simple carbohydrates in her diet to help decrease the risk of diabetes.  Gabrielle Castro will continue her meal plan and agrees to follow-up with [place] as directed to monitor her progress.  Obesity Gabrielle Castro is currently in the action stage of change. As such, her goal is to continue with weight loss efforts. She has agreed to follow the Category 2 plan + 100 calories. Gabrielle Castro has been instructed to begin walking 15-20 minutes 2-3 days per week for weight loss and overall health benefits. We discussed the following Behavioral Modification Strategies today: increasing lean protein intake, decreasing simple carbohydrates, and planning for success.  Gabrielle Castro has agreed to follow-up with our clinic in 2 weeks. She was informed of the importance of frequent follow-up visits to maximize her success with intensive lifestyle modifications for her multiple health conditions.  ALLERGIES: Allergies  Allergen  Reactions  . Nickel Swelling, Hives and Itching  . Tape Rash    MEDICATIONS: Current Outpatient Medications on File Prior to Visit  Medication Sig Dispense Refill  . albuterol (PROVENTIL HFA;VENTOLIN HFA) 108 (90 Base) MCG/ACT inhaler Inhale 2 puffs into the lungs every 4 (four) hours as needed for  wheezing or shortness of breath (cough, shortness of breath or wheezing.). 1 Inhaler 1  . Ascorbic Acid (VITAMIN C) 1000 MG tablet Take 1,000 mg by mouth daily.    . beclomethasone (QVAR) 40 MCG/ACT inhaler Inhale 2 puffs into the lungs 2 (two) times daily. 10.6 g 12  . Biotin 10000 MCG TABS Take 2 tablets by mouth daily.    . Calcium 600-200 MG-UNIT tablet Take 1 tablet by mouth daily.    . clonazePAM (KLONOPIN) 0.5 MG tablet Take 1 tablet (0.5 mg total) by mouth 2 (two) times daily as needed for anxiety. 30 tablet 0  . ferrous sulfate 325 (65 FE) MG tablet Take 325 mg by mouth every 3 (three) days.    Marland Kitchen Lysine 1000 MG TABS Take 1 tablet by mouth daily.    . SUPER B COMPLEX/C PO Take 2 capsules by mouth daily.     No current facility-administered medications on file prior to visit.     PAST MEDICAL HISTORY: Past Medical History:  Diagnosis Date  . Anxiety   . COPD (chronic obstructive pulmonary disease) ([place])   . Dyspnea   . EP (ectopic pregnancy)   . GERD (gastroesophageal reflux disease)     PAST SURGICAL HISTORY: Past Surgical History:  Procedure Laterality Date  . BREAST SURGERY    . EYE SURGERY    . HAND SURGERY    . KIDNEY DONATION    . KIDNEY DONATION    . TUBAL LIGATION      SOCIAL HISTORY: Social History   Tobacco Use  . Smoking status: Former Smoker    Packs/day: 1.00    Years: 41.00    Pack years: 41.00    Types: Cigarettes    Last attempt to quit: 03/03/2016    Years since quitting: 2.2  . Smokeless tobacco: Never Used  Substance Use Topics  . Alcohol use: Yes  . Drug use: Never    FAMILY HISTORY: Family History  Problem Relation Age of Onset  . Lung cancer Father        smoked  . Cancer Father        lung; +TOB  . Cancer Paternal Grandmother        "every kind of cancer"  . Heart disease Mother   . Diabetes Mother   . Hypertension Mother   . Hyperlipidemia Mother    ROS: Review of Systems  Gastrointestinal: Negative for nausea and  vomiting.  Musculoskeletal:       Negative for muscle weakness.   PHYSICAL EXAM: Pt in no acute distress  RECENT LABS AND TESTS: BMET    Component Value Date/Time   NA 140 12/02/2017 0846   K 4.7 12/02/2017 0846   CL 99 12/02/2017 0846   CO2 25 12/02/2017 0846   GLUCOSE 94 12/02/2017 0846   BUN 16 12/02/2017 0846   CREATININE 1.00 12/02/2017 0846   CALCIUM 9.7 12/02/2017 0846   GFRNONAA 61 12/02/2017 0846   GFRAA 70 12/02/2017 0846   Lab Results  Component Value Date   HGBA1C 5.5 12/02/2017   Lab Results  Component Value Date   INSULIN 8.2 12/02/2017   CBC    Component Value  Date/Time   WBC 6.3 12/02/2017 0846   WBC 8.5 07/19/2017 1535   RBC 4.63 12/02/2017 0846   RBC 4.93 07/19/2017 1535   HGB 13.9 12/02/2017 0846   HCT 42.5 12/02/2017 0846   PLT 230 04/29/2017 1146   MCV 92 12/02/2017 0846   MCH 30.0 12/02/2017 0846   MCH 29.3 07/19/2017 1535   MCHC 32.7 12/02/2017 0846   MCHC 32.7 07/19/2017 1535   RDW 13.2 12/02/2017 0846   LYMPHSABS 1.8 12/02/2017 0846   EOSABS 0.3 12/02/2017 0846   BASOSABS 0.1 12/02/2017 0846   Iron/TIBC/Ferritin/ %Sat No results found for: IRON, TIBC, FERRITIN, IRONPCTSAT Lipid Panel     Component Value Date/Time   CHOL 203 (H) 03/17/2018 0932   TRIG 66 03/17/2018 0932   HDL 70 03/17/2018 0932   CHOLHDL 2.6 04/29/2017 1146   LDLCALC 120 (H) 03/17/2018 0932   Hepatic Function Panel     Component Value Date/Time   PROT 6.9 12/02/2017 0846   ALBUMIN 4.6 12/02/2017 0846   AST 19 12/02/2017 0846   ALT 20 12/02/2017 0846   ALKPHOS 74 12/02/2017 0846   BILITOT 0.4 12/02/2017 0846      Component Value Date/Time   TSH 1.360 12/02/2017 0846   TSH 1.050 04/29/2017 1146   TSH 1.830 06/30/2016 1804   Results for DESERAI, CANSLER "LANETRA HARTLEY" (MRN [id number]) as of 06/08/2018 08:39  Ref. Range 03/17/2018 09:32  Vitamin D, 25-Hydroxy Latest Ref Range: 30.0 - 100.0 ng/mL 44.6   I, Michaelene Song, am acting as Location manager for [employer], FNP-C.  I have reviewed the above documentation for accuracy and completeness, and I agree with the above.  - Amer Alcindor, FNP-C.

## 2018-06-21 ENCOUNTER — Ambulatory Visit (INDEPENDENT_AMBULATORY_CARE_PROVIDER_SITE_OTHER): Payer: 59 | Admitting: Family Medicine

## 2018-06-21 ENCOUNTER — Other Ambulatory Visit: Payer: Self-pay

## 2018-06-21 DIAGNOSIS — E8881 Metabolic syndrome: Secondary | ICD-10-CM | POA: Diagnosis not present

## 2018-06-21 DIAGNOSIS — E669 Obesity, unspecified: Secondary | ICD-10-CM | POA: Diagnosis not present

## 2018-06-21 DIAGNOSIS — E559 Vitamin D deficiency, unspecified: Secondary | ICD-10-CM | POA: Diagnosis not present

## 2018-06-21 DIAGNOSIS — Z6832 Body mass index (BMI) 32.0-32.9, adult: Secondary | ICD-10-CM | POA: Diagnosis not present

## 2018-06-21 MED ORDER — VITAMIN D (ERGOCALCIFEROL) 1.25 MG (50000 UNIT) PO CAPS: 50000.0000 [IU] | ORAL_CAPSULE | ORAL | 0 refills | Status: DC

## 2018-06-22 ENCOUNTER — Encounter (INDEPENDENT_AMBULATORY_CARE_PROVIDER_SITE_OTHER): Payer: Self-pay | Admitting: Family Medicine

## 2018-06-22 NOTE — Progress Notes (Signed)
Office: [phone]  /  Fax: [phone] TeleHealth Visit:  Gabrielle Castro has verbally consented to this TeleHealth visit today. The patient is located at home, the provider is located at the [employer] [facility] office. The participants in this visit include the listed provider and patient. The visit was conducted today via FaceTime.  HPI:   Chief Complaint: OBESITY Gabrielle Castro is here to discuss her progress with her obesity treatment plan. She is on the Category 2 plan + 100 calories and is following her eating plan approximately 90% of the time. She states she is walking 40 minutes 3 times per week. Gabrielle Castro states she was off her plan over Mother's Day. She reports her weight today was 186 lbs. She states her stress eating has improved. She is getting protein at all of her meals but struggles with getting in enough protein at dinner. We were unable to weigh the patient today for this TeleHealth visit. She feels as if she has gained 2 lbs since her last visit. She has lost 5 lbs since starting treatment with [place].  Vitamin D deficiency Gabrielle Castro has a diagnosis of Vitamin D deficiency, which is not at goal. Her last Vitamin D level was reported to be 44.6 on 03/17/2018. She is currently taking prescription Vit D and denies nausea, vomiting or muscle weakness.  Insulin Resistance Gabrielle Castro has a diagnosis of insulin resistance based on her elevated fasting insulin level >5. Although Gabrielle Castro's blood glucose readings are still under good control, insulin resistance puts her at greater risk of metabolic syndrome and diabetes. She declined metformin currently. She continues to work on diet and exercise to decrease risk of diabetes. She does report polyphagia.  ASSESSMENT AND PLAN:  Vitamin D deficiency - Plan: Vitamin D, Ergocalciferol, (DRISDOL) 1.25 MG (50000 UT) CAPS capsule  Insulin resistance  Class 1 obesity with serious comorbidity and body mass index (BMI) of 32.0 to 32.9 in  adult, unspecified obesity type  PLAN:  Vitamin D Deficiency Gabrielle Castro was informed that low Vitamin D levels contributes to fatigue and are associated with obesity, breast, and colon cancer. She agrees to continue to take prescription Vit D @ 50,000 IU every week #4 with 0 refills and will follow-up for routine testing of Vitamin D, at least 2-3 times per year. She was informed of the risk of over-replacement of Vitamin D and agrees to not increase her dose unless she discusses this with [place] first. Gabrielle Castro agrees to follow-up with our clinic in 2 weeks.  Insulin Resistance Gabrielle Castro will continue to work on weight loss, exercise, and decreasing simple carbohydrates in her diet to help decrease the risk of diabetes.  Gabrielle Castro will continue her meal plan and agrees to follow-up with our clinic as directed to monitor her progress.  Obesity Gabrielle Castro is currently in the action stage of change. As such, her goal is to continue with weight loss efforts. She has agreed to follow the Category 2 plan + 100 calories. She may drink a glass of [employer] skim milk with 6 ounces of meat at dinner in order to meet protein goal. Gabrielle Castro has been instructed to continue her current exercise regimen for weight loss and overall health benefits. We discussed the following Behavioral Modification Strategies today: increasing lean protein intake, better snacking choices, and planning for success.  Gabrielle Castro has agreed to follow-up with our clinic in 2 weeks. She was informed of the importance of frequent follow-up visits to maximize her success with intensive lifestyle modifications for her multiple  health conditions.  ALLERGIES: Allergies  Allergen Reactions  . Nickel Swelling, Hives and Itching  . Tape Rash    MEDICATIONS: Current Outpatient Medications on File Prior to Visit  Medication Sig Dispense Refill  . albuterol (PROVENTIL HFA;VENTOLIN HFA) 108 (90 Base) MCG/ACT inhaler Inhale 2 puffs into the lungs  every 4 (four) hours as needed for wheezing or shortness of breath (cough, shortness of breath or wheezing.). 1 Inhaler 1  . Ascorbic Acid (VITAMIN C) 1000 MG tablet Take 1,000 mg by mouth daily.    . beclomethasone (QVAR) 40 MCG/ACT inhaler Inhale 2 puffs into the lungs 2 (two) times daily. 10.6 g 12  . Biotin 10000 MCG TABS Take 2 tablets by mouth daily.    . Calcium 600-200 MG-UNIT tablet Take 1 tablet by mouth daily.    . clonazePAM (KLONOPIN) 0.5 MG tablet Take 1 tablet (0.5 mg total) by mouth 2 (two) times daily as needed for anxiety. 30 tablet 0  . ferrous sulfate 325 (65 FE) MG tablet Take 325 mg by mouth every 3 (three) days.    Marland Kitchen Lysine 1000 MG TABS Take 1 tablet by mouth daily.    . SUPER B COMPLEX/C PO Take 2 capsules by mouth daily.     No current facility-administered medications on file prior to visit.     PAST MEDICAL HISTORY: Past Medical History:  Diagnosis Date  . Anxiety   . COPD (chronic obstructive pulmonary disease) ([place])   . Dyspnea   . EP (ectopic pregnancy)   . GERD (gastroesophageal reflux disease)     PAST SURGICAL HISTORY: Past Surgical History:  Procedure Laterality Date  . BREAST SURGERY    . EYE SURGERY    . HAND SURGERY    . KIDNEY DONATION    . KIDNEY DONATION    . TUBAL LIGATION      SOCIAL HISTORY: Social History   Tobacco Use  . Smoking status: Former Smoker    Packs/day: 1.00    Years: 41.00    Pack years: 41.00    Types: Cigarettes    Last attempt to quit: 03/03/2016    Years since quitting: 2.3  . Smokeless tobacco: Never Used  Substance Use Topics  . Alcohol use: Yes  . Drug use: Never    FAMILY HISTORY: Family History  Problem Relation Age of Onset  . Lung cancer Father        smoked  . Cancer Father        lung; +TOB  . Cancer Paternal Grandmother        "every kind of cancer"  . Heart disease Mother   . Diabetes Mother   . Hypertension Mother   . Hyperlipidemia Mother    ROS: Review of Systems   Gastrointestinal: Negative for nausea and vomiting.  Musculoskeletal:       Negative for muscle weakness.  Endo/Heme/Allergies:       Positive for polyphagia.   PHYSICAL EXAM: Pt in no acute distress  RECENT LABS AND TESTS: BMET    Component Value Date/Time   NA 140 12/02/2017 0846   K 4.7 12/02/2017 0846   CL 99 12/02/2017 0846   CO2 25 12/02/2017 0846   GLUCOSE 94 12/02/2017 0846   BUN 16 12/02/2017 0846   CREATININE 1.00 12/02/2017 0846   CALCIUM 9.7 12/02/2017 0846   GFRNONAA 61 12/02/2017 0846   GFRAA 70 12/02/2017 0846   Lab Results  Component Value Date   HGBA1C 5.5 12/02/2017   Lab  Results  Component Value Date   INSULIN 8.2 12/02/2017   CBC    Component Value Date/Time   WBC 6.3 12/02/2017 0846   WBC 8.5 07/19/2017 1535   RBC 4.63 12/02/2017 0846   RBC 4.93 07/19/2017 1535   HGB 13.9 12/02/2017 0846   HCT 42.5 12/02/2017 0846   PLT 230 04/29/2017 1146   MCV 92 12/02/2017 0846   MCH 30.0 12/02/2017 0846   MCH 29.3 07/19/2017 1535   MCHC 32.7 12/02/2017 0846   MCHC 32.7 07/19/2017 1535   RDW 13.2 12/02/2017 0846   LYMPHSABS 1.8 12/02/2017 0846   EOSABS 0.3 12/02/2017 0846   BASOSABS 0.1 12/02/2017 0846   Iron/TIBC/Ferritin/ %Sat No results found for: IRON, TIBC, FERRITIN, IRONPCTSAT Lipid Panel     Component Value Date/Time   CHOL 203 (H) 03/17/2018 0932   TRIG 66 03/17/2018 0932   HDL 70 03/17/2018 0932   CHOLHDL 2.6 04/29/2017 1146   LDLCALC 120 (H) 03/17/2018 0932   Hepatic Function Panel     Component Value Date/Time   PROT 6.9 12/02/2017 0846   ALBUMIN 4.6 12/02/2017 0846   AST 19 12/02/2017 0846   ALT 20 12/02/2017 0846   ALKPHOS 74 12/02/2017 0846   BILITOT 0.4 12/02/2017 0846      Component Value Date/Time   TSH 1.360 12/02/2017 0846   TSH 1.050 04/29/2017 1146   TSH 1.830 06/30/2016 1804   Results for VELITA, QUIRK "PETRA DUMLER" (MRN [id number]) as of 06/22/2018 08:42  Ref. Range 03/17/2018 09:32  Vitamin D, 25-Hydroxy  Latest Ref Range: 30.0 - 100.0 ng/mL 44.6   I, Michaelene Song, am acting as Location manager for [employer], FNP-C.  I have reviewed the above documentation for accuracy and completeness, and I agree with the above.  - Brysen Shankman, FNP-C.

## 2018-06-25 ENCOUNTER — Encounter: Payer: Self-pay | Admitting: Family Medicine

## 2018-06-26 NOTE — Telephone Encounter (Signed)
MyChart request for medication refill Clonazepam forwarded to patient's PCP

## 2018-06-27 MED FILL — QVAR REDIHALER 40 MCG/ACT A: 40 | 30 days supply | Qty: 11 | Fill #1

## 2018-06-27 NOTE — Telephone Encounter (Signed)
Patient last seen oct 2019 Needs OV prior to refill of controlled substance.  thanks

## 2018-06-28 ENCOUNTER — Other Ambulatory Visit: Payer: Self-pay

## 2018-06-28 DIAGNOSIS — J449 Chronic obstructive pulmonary disease, unspecified: Secondary | ICD-10-CM

## 2018-06-28 MED ORDER — ALBUTEROL SULFATE HFA 108 (90 BASE) MCG/ACT IN AERS: 2.0000 | INHALATION_SPRAY | RESPIRATORY_TRACT | 0 refills | Status: DC | PRN

## 2018-06-29 MED FILL — VIT D2 1.25 MG (50,000 UNIT: 1.25 MG | 28 days supply | Qty: 4 | Fill #0

## 2018-07-01 ENCOUNTER — Telehealth: Payer: Self-pay | Admitting: Family Medicine

## 2018-07-01 MED FILL — ALBUTEROL SULFATE HFA 108 (: 108 (90 BAS | 17 days supply | Qty: 18 | Fill #0

## 2018-07-01 NOTE — Telephone Encounter (Signed)
Patient still has not received her rescue inhaler and she will be out before the Holiday.  Juvar ready inhaler for her COPD

## 2018-07-04 ENCOUNTER — Other Ambulatory Visit: Payer: Self-pay | Admitting: Family Medicine

## 2018-07-04 DIAGNOSIS — J449 Chronic obstructive pulmonary disease, unspecified: Secondary | ICD-10-CM

## 2018-07-04 MED ORDER — BECLOMETHASONE DIPROP HFA 40 MCG/ACT IN AERB: 2.0000 | INHALATION_SPRAY | Freq: Two times a day (BID) | RESPIRATORY_TRACT | 3 refills | Status: DC

## 2018-07-04 NOTE — Telephone Encounter (Signed)
refilled 

## 2018-07-07 ENCOUNTER — Other Ambulatory Visit: Payer: Self-pay

## 2018-07-07 ENCOUNTER — Encounter (INDEPENDENT_AMBULATORY_CARE_PROVIDER_SITE_OTHER): Payer: Self-pay | Admitting: Family Medicine

## 2018-07-07 ENCOUNTER — Ambulatory Visit (INDEPENDENT_AMBULATORY_CARE_PROVIDER_SITE_OTHER): Payer: 59 | Admitting: Family Medicine

## 2018-07-07 DIAGNOSIS — Z6832 Body mass index (BMI) 32.0-32.9, adult: Secondary | ICD-10-CM | POA: Diagnosis not present

## 2018-07-07 DIAGNOSIS — E669 Obesity, unspecified: Secondary | ICD-10-CM

## 2018-07-07 DIAGNOSIS — E559 Vitamin D deficiency, unspecified: Secondary | ICD-10-CM

## 2018-07-07 NOTE — Progress Notes (Signed)
Office: [phone]  /  Fax: [phone] TeleHealth Visit:  Gabrielle Castro has verbally consented to this TeleHealth visit today. The patient is located at work, the provider is located at the [employer] [facility] office. The participants in this visit include the listed provider and patient. The visit was conducted today via FaceTime.  HPI:   Chief Complaint: OBESITY Gabrielle Castro is here to discuss her progress with her obesity treatment plan. She is on the Category 2 plan + 100 calories and is following her eating plan approximately 85-90% of the time. She states she is walking 30 minutes 5 times per week. [name] states she has gained 2 lbs and her weight is 188 lbs. She admits to stress eating recently which is unusual for her. She has been on the plan other than that. 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 We were unable to weigh the patient today for this TeleHealth visit. She reports her current weight to be 188 lbs. She has lost 5 lbs since starting treatment with [place].  Vitamin D deficiency [name] has a diagnosis of Vitamin D deficiency, which is not at goal. Her last Vitamin D level was reported to be 44.6 on 03/17/2018. She is currently taking prescription Vit D and denies nausea, vomiting or muscle weakness.  ASSESSMENT AND PLAN:  Vitamin D deficiency  Class 1 obesity with serious comorbidity and body mass index (BMI) of 32.0 to 32.9 in adult, unspecified obesity type  PLAN:  Vitamin D Deficiency [name] was informed that low Vitamin D levels contributes to fatigue and are associated with obesity, breast, and colon cancer. She agrees to continue taking prescription Vit D (no prescription needed) and will follow-up for routine testing of Vitamin D at the time of her next in office visit. She was informed of the risk of over-replacement of Vitamin D and agrees to not increase her dose unless she discusses this with [place] first. [name] agrees to follow-up  with our clinic in 2 weeks.  I spent > than 50% of the 15 minute visit on counseling as documented in the note.  Obesity [name] is currently in the action stage of change. As such, her goal is to continue with weight loss efforts. She has agreed to follow the Category 2 plan + 100 calories. [name] has been instructed to continue her current exercise regimen for weight loss and overall health benefits. She will walk to work when possible (1 mile each way). We discussed the following Behavioral Modification Strategies today: emotional eating strategies.  [name] has agreed to follow-up with our clinic in 2 weeks. She was informed of the importance of frequent follow-up visits to maximize her success with intensive lifestyle modifications for her multiple health conditions.  ALLERGIES: Allergies  Allergen Reactions   Nickel Swelling, Hives and Itching   Tape Rash    MEDICATIONS: Current Outpatient Medications on File Prior to Visit  Medication Sig Dispense Refill   albuterol (VENTOLIN HFA) 108 (90 Base) MCG/ACT inhaler Inhale 2 puffs into the lungs every 4 (four) hours as needed for wheezing or shortness of breath (cough, shortness of breath or wheezing.). 1 Inhaler 0   Ascorbic Acid (VITAMIN C) 1000 MG tablet Take 1,000 mg by mouth daily.     beclomethasone (QVAR) 40 MCG/ACT inhaler Inhale 2 puffs into the lungs 2 (two) times daily. 10.6 g 12   beclomethasone (QVAR) 40 MCG/ACT inhaler Inhale 2 puffs into the lungs 2 (two) times daily. 10.6 g 3  Biotin 10000 MCG TABS Take 2 tablets by mouth daily.     Calcium 600-200 MG-UNIT tablet Take 1 tablet by mouth daily.     clonazePAM (KLONOPIN) 0.5 MG tablet Take 1 tablet (0.5 mg total) by mouth 2 (two) times daily as needed for anxiety. 30 tablet 0   ferrous sulfate 325 (65 FE) MG tablet Take 325 mg by mouth every 3 (three) days.     Lysine 1000 MG TABS Take 1 tablet by mouth daily.     SUPER B COMPLEX/C PO Take 2 capsules by  mouth daily.     Vitamin D, Ergocalciferol, (DRISDOL) 1.25 MG (50000 UT) CAPS capsule Take 1 capsule (50,000 Units total) by mouth every 7 (seven) days. 4 capsule 0   No current facility-administered medications on file prior to visit.     PAST MEDICAL HISTORY: Past Medical History:  Diagnosis Date   Anxiety    COPD (chronic obstructive pulmonary disease) ([place])    Dyspnea    EP (ectopic pregnancy)    GERD (gastroesophageal reflux disease)     PAST SURGICAL HISTORY: Past Surgical History:  Procedure Laterality Date   BREAST SURGERY     EYE SURGERY     HAND SURGERY     KIDNEY DONATION     KIDNEY DONATION     TUBAL LIGATION      SOCIAL HISTORY: Social History   Tobacco Use   Smoking status: Former Smoker    Packs/day: 1.00    Years: 41.00    Pack years: 41.00    Types: Cigarettes    Last attempt to quit: 03/03/2016    Years since quitting: 2.3   Smokeless tobacco: Never Used  Substance Use Topics   Alcohol use: Yes   Drug use: Never    FAMILY HISTORY: Family History  Problem Relation Age of Onset   Lung cancer Father        smoked   Cancer Father        lung; +TOB   Cancer Paternal Grandmother        "every kind of cancer"   Heart disease Mother    Diabetes Mother    Hypertension Mother    Hyperlipidemia Mother    ROS: Review of Systems  Gastrointestinal: Negative for nausea and vomiting.  Musculoskeletal:       Negative for muscle weakness.   PHYSICAL EXAM: Pt in no acute distress  RECENT LABS AND TESTS: BMET    Component Value Date/Time   NA 140 12/02/2017 0846   K 4.7 12/02/2017 0846   CL 99 12/02/2017 0846   CO2 25 12/02/2017 0846   GLUCOSE 94 12/02/2017 0846   BUN 16 12/02/2017 0846   CREATININE 1.00 12/02/2017 0846   CALCIUM 9.7 12/02/2017 0846   GFRNONAA 61 12/02/2017 0846   GFRAA 70 12/02/2017 0846   Lab Results  Component Value Date   HGBA1C 5.5 12/02/2017   Lab Results  Component Value Date   INSULIN  8.2 12/02/2017   CBC    Component Value Date/Time   WBC 6.3 12/02/2017 0846   WBC 8.5 07/19/2017 1535   RBC 4.63 12/02/2017 0846   RBC 4.93 07/19/2017 1535   HGB 13.9 12/02/2017 0846   HCT 42.5 12/02/2017 0846   PLT 230 04/29/2017 1146   MCV 92 12/02/2017 0846   MCH 30.0 12/02/2017 0846   MCH 29.3 07/19/2017 1535   MCHC 32.7 12/02/2017 0846   MCHC 32.7 07/19/2017 1535   RDW 13.2 12/02/2017 0846  LYMPHSABS 1.8 12/02/2017 0846   EOSABS 0.3 12/02/2017 0846   BASOSABS 0.1 12/02/2017 0846   Iron/TIBC/Ferritin/ %Sat No results found for: IRON, TIBC, FERRITIN, IRONPCTSAT Lipid Panel     Component Value Date/Time   CHOL 203 (H) 03/17/2018 0932   TRIG 66 03/17/2018 0932   HDL 70 03/17/2018 0932   CHOLHDL 2.6 04/29/2017 1146   LDLCALC 120 (H) 03/17/2018 0932   Hepatic Function Panel     Component Value Date/Time   PROT 6.9 12/02/2017 0846   ALBUMIN 4.6 12/02/2017 0846   AST 19 12/02/2017 0846   ALT 20 12/02/2017 0846   ALKPHOS 74 12/02/2017 0846   BILITOT 0.4 12/02/2017 0846      Component Value Date/Time   TSH 1.360 12/02/2017 0846   TSH 1.050 04/29/2017 1146   TSH 1.830 06/30/2016 1804   Results for SHANDRICKA, MONROY "JENNIEFER SALAK" (MRN [id number]) as of 07/07/2018 09:51  Ref. Range 03/17/2018 09:32  Vitamin D, 25-Hydroxy Latest Ref Range: 30.0 - 100.0 ng/mL 44.6    I, Michaelene Song, am acting as Location manager for [employer], FNP-C.  I have reviewed the above documentation for accuracy and completeness, and I agree with the above.  - Mikolaj Woolstenhulme, FNP-C.

## 2018-07-15 ENCOUNTER — Telehealth: Payer: Self-pay | Admitting: Family Medicine

## 2018-07-19 ENCOUNTER — Encounter (INDEPENDENT_AMBULATORY_CARE_PROVIDER_SITE_OTHER): Payer: Self-pay | Admitting: Family Medicine

## 2018-07-19 ENCOUNTER — Other Ambulatory Visit: Payer: Self-pay

## 2018-07-19 ENCOUNTER — Ambulatory Visit (INDEPENDENT_AMBULATORY_CARE_PROVIDER_SITE_OTHER): Payer: 59 | Admitting: Family Medicine

## 2018-07-19 DIAGNOSIS — E669 Obesity, unspecified: Secondary | ICD-10-CM

## 2018-07-19 DIAGNOSIS — Z6832 Body mass index (BMI) 32.0-32.9, adult: Secondary | ICD-10-CM | POA: Diagnosis not present

## 2018-07-19 DIAGNOSIS — E8881 Metabolic syndrome: Secondary | ICD-10-CM

## 2018-07-19 DIAGNOSIS — E559 Vitamin D deficiency, unspecified: Secondary | ICD-10-CM | POA: Diagnosis not present

## 2018-07-19 MED ORDER — METFORMIN HCL 500 MG PO TABS: 500.0000 mg | ORAL_TABLET | Freq: Every day | ORAL | 0 refills | Status: DC

## 2018-07-19 MED ORDER — VITAMIN D (ERGOCALCIFEROL) 1.25 MG (50000 UNIT) PO CAPS: 50000.0000 [IU] | ORAL_CAPSULE | ORAL | 0 refills | Status: DC

## 2018-07-19 MED FILL — metFORMIN HCL 500 MG TABS: 500 | 30 days supply | Qty: 30 | Fill #0

## 2018-07-19 NOTE — Progress Notes (Signed)
Office: [phone]  /  Fax: [phone] TeleHealth Visit:  Gabrielle Castro has verbally consented to this TeleHealth visit today. The patient is located at home, the provider is located at the [employer] [facility] office. The participants in this visit include the listed provider and patient. The visit was conducted today via face time.  HPI:   Chief Complaint: OBESITY Gabrielle Castro is here to discuss her progress with her obesity treatment plan. She is on the Category 2 plan + 100 calories and is following her eating plan approximately 90 % of the time. She states she is walking 4 miles 7 times per week. Gabrielle Castro has lost a pound and is down to 187 lbs. She had pizza this past weekend. She feels stress eating has reduced. She has been walking several miles a day. This is improving her breathing as she has COPD.  We were unable to weigh the patient today for this TeleHealth visit. She feels as if she has lost 1 lb since her last visit. She has lost 5 lbs since starting treatment with [place].  Vitamin D Deficiency Gabrielle Castro has a diagnosis of vitamin D deficiency. She is currently taking prescription Vit D, but level is not at goal. Last Vit D level was 44.6 on 03/17/2018. She denies nausea, vomiting or muscle weakness.  Insulin Resistance Gabrielle Castro has a diagnosis of insulin resistance based on her elevated fasting insulin level >5. Although Gabrielle Castro's blood glucose readings are still under good control, insulin resistance puts her at greater risk of metabolic syndrome and diabetes. She is not taking metformin currently and notes polyphagia in the mornings. She has an apple at this time for a snack. We discussed metformin and side effects, and she is willing to try it. She continues to work on diet and exercise to decrease risk of diabetes.  ASSESSMENT AND PLAN:  Vitamin D deficiency - Plan: Vitamin D, Ergocalciferol, (DRISDOL) 1.25 MG (50000 UT) CAPS capsule  Insulin resistance - Plan: metFORMIN  (GLUCOPHAGE) 500 MG tablet  Class 1 obesity with serious comorbidity and body mass index (BMI) of 32.0 to 32.9 in adult, unspecified obesity type  PLAN:  Vitamin D Deficiency Gabrielle Castro was informed that low vitamin D levels contributes to fatigue and are associated with obesity, breast, and colon cancer. Gabrielle Castro agrees to continue taking prescription Vit D @50 ,000 IU every week #4 and we will refill for 1 month. She will follow up for routine testing of vitamin D, at least 2-3 times per year. She was informed of the risk of over-replacement of vitamin D and agrees to not increase her dose unless she discusses this with [place] first. Gabrielle Castro agrees to follow up with our clinic in 2 to 3 weeks.  Insulin Resistance Gabrielle Castro will continue to work on weight loss, exercise, and decreasing simple carbohydrates in her diet to help decrease the risk of diabetes. We dicussed metformin including benefits and risks. She was informed that eating too many simple carbohydrates or too many calories at one sitting increases the likelihood of GI side effects. Gabrielle Castro agrees to start metformin 500 mg q AM #30 with no refills. Gabrielle Castro agrees to follow up with our clinic in 2 to 3 weeks as directed to monitor her progress.  Obesity Gabrielle Castro is not currently in the action stage of change. As such, her goal is to continue with weight loss efforts She has agreed to follow the Category 2 plan + 100 calories Gabrielle Castro will continue current exercise regimen for weight loss and overall health  benefits. We discussed the following Behavioral Modification Strategies today: planning for success   Gabrielle Castro has agreed to follow up with our clinic in 2 to 3 weeks. She was informed of the importance of frequent follow up visits to maximize her success with intensive lifestyle modifications for her multiple health conditions.  ALLERGIES: Allergies  Allergen Reactions  . Nickel Swelling, Hives and Itching  . Tape Rash     MEDICATIONS: Current Outpatient Medications on File Prior to Visit  Medication Sig Dispense Refill  . albuterol (VENTOLIN HFA) 108 (90 Base) MCG/ACT inhaler Inhale 2 puffs into the lungs every 4 (four) hours as needed for wheezing or shortness of breath (cough, shortness of breath or wheezing.). 1 Inhaler 0  . Ascorbic Acid (VITAMIN C) 1000 MG tablet Take 1,000 mg by mouth daily.    . beclomethasone (QVAR) 40 MCG/ACT inhaler Inhale 2 puffs into the lungs 2 (two) times daily. 10.6 g 12  . beclomethasone (QVAR) 40 MCG/ACT inhaler Inhale 2 puffs into the lungs 2 (two) times daily. 10.6 g 3  . Biotin 10000 MCG TABS Take 2 tablets by mouth daily.    . Calcium 600-200 MG-UNIT tablet Take 1 tablet by mouth daily.    . clonazePAM (KLONOPIN) 0.5 MG tablet Take 1 tablet (0.5 mg total) by mouth 2 (two) times daily as needed for anxiety. 30 tablet 0  . ferrous sulfate 325 (65 FE) MG tablet Take 325 mg by mouth every 3 (three) days.    Marland Kitchen Lysine 1000 MG TABS Take 1 tablet by mouth daily.    . SUPER B COMPLEX/C PO Take 2 capsules by mouth daily.     No current facility-administered medications on file prior to visit.     PAST MEDICAL HISTORY: Past Medical History:  Diagnosis Date  . Anxiety   . COPD (chronic obstructive pulmonary disease) ([place])   . Dyspnea   . EP (ectopic pregnancy)   . GERD (gastroesophageal reflux disease)     PAST SURGICAL HISTORY: Past Surgical History:  Procedure Laterality Date  . BREAST SURGERY    . EYE SURGERY    . HAND SURGERY    . KIDNEY DONATION    . KIDNEY DONATION    . TUBAL LIGATION      SOCIAL HISTORY: Social History   Tobacco Use  . Smoking status: Former Smoker    Packs/day: 1.00    Years: 41.00    Pack years: 41.00    Types: Cigarettes    Last attempt to quit: 03/03/2016    Years since quitting: 2.3  . Smokeless tobacco: Never Used  Substance Use Topics  . Alcohol use: Yes  . Drug use: Never    FAMILY HISTORY: Family History  Problem  Relation Age of Onset  . Lung cancer Father        smoked  . Cancer Father        lung; +TOB  . Cancer Paternal Grandmother        "every kind of cancer"  . Heart disease Mother   . Diabetes Mother   . Hypertension Mother   . Hyperlipidemia Mother     ROS: Review of Systems  Constitutional: Positive for weight loss.  Gastrointestinal: Negative for nausea and vomiting.  Musculoskeletal:       Negative muscle weakness  Endo/Heme/Allergies:       Positive polyphagia    PHYSICAL EXAM: Pt in no acute distress  RECENT LABS AND TESTS: BMET    Component Value Date/Time  NA 140 12/02/2017 0846   K 4.7 12/02/2017 0846   CL 99 12/02/2017 0846   CO2 25 12/02/2017 0846   GLUCOSE 94 12/02/2017 0846   BUN 16 12/02/2017 0846   CREATININE 1.00 12/02/2017 0846   CALCIUM 9.7 12/02/2017 0846   GFRNONAA 61 12/02/2017 0846   GFRAA 70 12/02/2017 0846   Lab Results  Component Value Date   HGBA1C 5.5 12/02/2017   Lab Results  Component Value Date   INSULIN 8.2 12/02/2017   CBC    Component Value Date/Time   WBC 6.3 12/02/2017 0846   WBC 8.5 07/19/2017 1535   RBC 4.63 12/02/2017 0846   RBC 4.93 07/19/2017 1535   HGB 13.9 12/02/2017 0846   HCT 42.5 12/02/2017 0846   PLT 230 04/29/2017 1146   MCV 92 12/02/2017 0846   MCH 30.0 12/02/2017 0846   MCH 29.3 07/19/2017 1535   MCHC 32.7 12/02/2017 0846   MCHC 32.7 07/19/2017 1535   RDW 13.2 12/02/2017 0846   LYMPHSABS 1.8 12/02/2017 0846   EOSABS 0.3 12/02/2017 0846   BASOSABS 0.1 12/02/2017 0846   Iron/TIBC/Ferritin/ %Sat No results found for: IRON, TIBC, FERRITIN, IRONPCTSAT Lipid Panel     Component Value Date/Time   CHOL 203 (H) 03/17/2018 0932   TRIG 66 03/17/2018 0932   HDL 70 03/17/2018 0932   CHOLHDL 2.6 04/29/2017 1146   LDLCALC 120 (H) 03/17/2018 0932   Hepatic Function Panel     Component Value Date/Time   PROT 6.9 12/02/2017 0846   ALBUMIN 4.6 12/02/2017 0846   AST 19 12/02/2017 0846   ALT 20 12/02/2017  0846   ALKPHOS 74 12/02/2017 0846   BILITOT 0.4 12/02/2017 0846      Component Value Date/Time   TSH 1.360 12/02/2017 0846   TSH 1.050 04/29/2017 1146   TSH 1.830 06/30/2016 1804      I, Trixie Dredge, am acting as Location manager for [employer], FNP-C  I have reviewed the above documentation for accuracy and completeness, and I agree with the above.  - Analese Sovine, FNP-C.

## 2018-07-20 NOTE — Telephone Encounter (Signed)
SCHEDULED PATIENT.

## 2018-07-21 MED FILL — VIT D2 1.25 MG (50,000 UNIT: 1.25 MG | 28 days supply | Qty: 4 | Fill #0

## 2018-07-25 ENCOUNTER — Telehealth: Payer: 59 | Admitting: Registered Nurse

## 2018-07-25 ENCOUNTER — Telehealth: Payer: 59 | Admitting: Family Medicine

## 2018-07-25 ENCOUNTER — Encounter (INDEPENDENT_AMBULATORY_CARE_PROVIDER_SITE_OTHER): Payer: Self-pay | Admitting: Family Medicine

## 2018-07-26 MED FILL — QVAR REDIHALER 40 MCG/ACT A: 40 | 30 days supply | Qty: 11 | Fill #2

## 2018-07-27 ENCOUNTER — Encounter: Payer: Self-pay | Admitting: Family Medicine

## 2018-08-09 ENCOUNTER — Telehealth (INDEPENDENT_AMBULATORY_CARE_PROVIDER_SITE_OTHER): Payer: 59 | Admitting: Family Medicine

## 2018-08-09 ENCOUNTER — Other Ambulatory Visit: Payer: Self-pay

## 2018-08-09 ENCOUNTER — Encounter (INDEPENDENT_AMBULATORY_CARE_PROVIDER_SITE_OTHER): Payer: Self-pay | Admitting: Family Medicine

## 2018-08-09 DIAGNOSIS — Z6832 Body mass index (BMI) 32.0-32.9, adult: Secondary | ICD-10-CM

## 2018-08-09 DIAGNOSIS — E8881 Metabolic syndrome: Secondary | ICD-10-CM | POA: Diagnosis not present

## 2018-08-09 DIAGNOSIS — E559 Vitamin D deficiency, unspecified: Secondary | ICD-10-CM | POA: Diagnosis not present

## 2018-08-09 DIAGNOSIS — E669 Obesity, unspecified: Secondary | ICD-10-CM | POA: Diagnosis not present

## 2018-08-09 DIAGNOSIS — E88819 Insulin resistance, unspecified: Secondary | ICD-10-CM

## 2018-08-09 MED ORDER — METFORMIN HCL 500 MG PO TABS: 500.0000 mg | ORAL_TABLET | Freq: Every day | ORAL | 0 refills | Status: DC

## 2018-08-10 NOTE — Progress Notes (Signed)
Office: [phone]  /  Fax: [phone] TeleHealth Visit:  [name] has verbally consented to this TeleHealth visit today. The patient is located at work, the provider is located at the [employer] [facility] office. The participants in this visit include the listed provider and patient. The visit was conducted today via FaceTime.  HPI:   Chief Complaint: OBESITY Gabrielle Castro is here to discuss her progress with her obesity treatment plan. She is on the Category 2 plan + 100 calories and is following her eating plan approximately 95% of the time. She states she is walking to work 25-30 minutes 4-5 times per week. Gabrielle Castro states she has lost 1 lb since her last visit and her weight today is 186.3. She states she is sticking to the plan well. She does report she has not been walking due to poor air quality recently since she has COPD. We were unable to weigh the patient today for this TeleHealth visit. She feels as if she has lost 1 lb since her last visit. She has lost 5 lbs since starting treatment with [place].  Vitamin D deficiency Gabrielle Castro has a diagnosis of Vitamin D deficiency, which is at goal at 69. Her last Vitamin D level was reported to be 44.6 on 03/17/2018. She is currently taking prescription Vit D and denies nausea, vomiting or muscle weakness. She does not want to check labs at this time because she does not want to take time off from work. I am concerned about over-replacement.  Insulin Resistance Gabrielle Castro has a diagnosis of insulin resistance based on her elevated fasting insulin level >5. Although Marshella's blood glucose readings are still under good control, insulin resistance puts her at greater risk of metabolic syndrome and diabetes. She is taking metformin currently and continues to work on diet and exercise to decrease risk of diabetes. No polyphagia. Lab Results  Component Value Date   HGBA1C 5.5 12/02/2017    ASSESSMENT AND PLAN:  Vitamin D deficiency   Insulin resistance - Plan: metFORMIN (GLUCOPHAGE) 500 MG tablet  Class 1 obesity with serious comorbidity and body mass index (BMI) of 32.0 to 32.9 in adult, unspecified obesity type  PLAN:  Vitamin D Deficiency Gabrielle Castro was informed that low Vitamin D levels contributes to fatigue and are associated with obesity, breast, and colon cancer. She will discontinue prescription Vitamin D for now since she is unable to get labs and will start OTC Vitamin D3 2,000 daily. She will follow-up for routine testing of Vitamin D, at least 2-3 times per year. She was informed of the risk of over-replacement of Vitamin D and agrees to not increase her dose unless she discusses this with [place] first. Faithlynn agrees to follow-up with our clinic in 6-7 weeks.  Insulin Resistance Gabrielle Castro will continue to work on weight loss, exercise, and decreasing simple carbohydrates in her diet to help decrease the risk of diabetes.  Gabrielle Castro was given a refill on her metformin 500 mg QAM with breakfast #30 with 1 refill. She agrees to follow-up with our clinic in 6-7 weeks.  Obesity Gabrielle Castro is currently in the action stage of change. As such, her goal is to continue with weight loss efforts. She has agreed to follow the Category 2 plan + 100 calories,. [name] has been instructed to resume exercising when air quality improves for weight loss and overall health benefits. We discussed the following Behavioral Modification Strategies today: keep a strict food journal.  [name] has agreed to follow-up with our clinic in 6-7  weeks. She was informed of the importance of frequent follow-up visits to maximize her success with intensive lifestyle modifications for her multiple health conditions.  ALLERGIES: Allergies  Allergen Reactions  . Nickel Swelling, Hives and Itching  . Tape Rash    MEDICATIONS: Current Outpatient Medications on File Prior to Visit  Medication Sig Dispense Refill  . cholecalciferol (VITAMIN D3) 25  MCG (1000 UT) tablet Take 2,000 Units by mouth daily.    Marland Kitchen albuterol (VENTOLIN HFA) 108 (90 Base) MCG/ACT inhaler Inhale 2 puffs into the lungs every 4 (four) hours as needed for wheezing or shortness of breath (cough, shortness of breath or wheezing.). 1 Inhaler 0  . Ascorbic Acid (VITAMIN C) 1000 MG tablet Take 1,000 mg by mouth daily.    . beclomethasone (QVAR) 40 MCG/ACT inhaler Inhale 2 puffs into the lungs 2 (two) times daily. 10.6 g 12  . beclomethasone (QVAR) 40 MCG/ACT inhaler Inhale 2 puffs into the lungs 2 (two) times daily. 10.6 g 3  . Biotin 10000 MCG TABS Take 2 tablets by mouth daily.    . Calcium 600-200 MG-UNIT tablet Take 1 tablet by mouth daily.    . clonazePAM (KLONOPIN) 0.5 MG tablet Take 1 tablet (0.5 mg total) by mouth 2 (two) times daily as needed for anxiety. 30 tablet 0  . ferrous sulfate 325 (65 FE) MG tablet Take 325 mg by mouth every 3 (three) days.    Marland Kitchen Lysine 1000 MG TABS Take 1 tablet by mouth daily.    . SUPER B COMPLEX/C PO Take 2 capsules by mouth daily.     No current facility-administered medications on file prior to visit.     PAST MEDICAL HISTORY: Past Medical History:  Diagnosis Date  . Anxiety   . COPD (chronic obstructive pulmonary disease) ([place])   . Dyspnea   . EP (ectopic pregnancy)   . GERD (gastroesophageal reflux disease)     PAST SURGICAL HISTORY: Past Surgical History:  Procedure Laterality Date  . BREAST SURGERY    . EYE SURGERY    . HAND SURGERY    . KIDNEY DONATION    . KIDNEY DONATION    . TUBAL LIGATION      SOCIAL HISTORY: Social History   Tobacco Use  . Smoking status: Former Smoker    Packs/day: 1.00    Years: 41.00    Pack years: 41.00    Types: Cigarettes    Quit date: 03/03/2016    Years since quitting: 2.4  . Smokeless tobacco: Never Used  Substance Use Topics  . Alcohol use: Yes  . Drug use: Never    FAMILY HISTORY: Family History  Problem Relation Age of Onset  . Lung cancer Father        smoked  .  Cancer Father        lung; +TOB  . Cancer Paternal Grandmother        "every kind of cancer"  . Heart disease Mother   . Diabetes Mother   . Hypertension Mother   . Hyperlipidemia Mother    ROS: Review of Systems  Gastrointestinal: Negative for nausea and vomiting.  Musculoskeletal:       Negative for muscle weakness.  Endo/Heme/Allergies:       Negative for polyphagia.   PHYSICAL EXAM: Pt in no acute distress  RECENT LABS AND TESTS: BMET    Component Value Date/Time   NA 140 12/02/2017 0846   K 4.7 12/02/2017 0846   CL 99 12/02/2017 0846  CO2 25 12/02/2017 0846   GLUCOSE 94 12/02/2017 0846   BUN 16 12/02/2017 0846   CREATININE 1.00 12/02/2017 0846   CALCIUM 9.7 12/02/2017 0846   GFRNONAA 61 12/02/2017 0846   GFRAA 70 12/02/2017 0846   Lab Results  Component Value Date   HGBA1C 5.5 12/02/2017   Lab Results  Component Value Date   INSULIN 8.2 12/02/2017   CBC    Component Value Date/Time   WBC 6.3 12/02/2017 0846   WBC 8.5 07/19/2017 1535   RBC 4.63 12/02/2017 0846   RBC 4.93 07/19/2017 1535   HGB 13.9 12/02/2017 0846   HCT 42.5 12/02/2017 0846   PLT 230 04/29/2017 1146   MCV 92 12/02/2017 0846   MCH 30.0 12/02/2017 0846   MCH 29.3 07/19/2017 1535   MCHC 32.7 12/02/2017 0846   MCHC 32.7 07/19/2017 1535   RDW 13.2 12/02/2017 0846   LYMPHSABS 1.8 12/02/2017 0846   EOSABS 0.3 12/02/2017 0846   BASOSABS 0.1 12/02/2017 0846   Iron/TIBC/Ferritin/ %Sat No results found for: IRON, TIBC, FERRITIN, IRONPCTSAT Lipid Panel     Component Value Date/Time   CHOL 203 (H) 03/17/2018 0932   TRIG 66 03/17/2018 0932   HDL 70 03/17/2018 0932   CHOLHDL 2.6 04/29/2017 1146   LDLCALC 120 (H) 03/17/2018 0932   Hepatic Function Panel     Component Value Date/Time   PROT 6.9 12/02/2017 0846   ALBUMIN 4.6 12/02/2017 0846   AST 19 12/02/2017 0846   ALT 20 12/02/2017 0846   ALKPHOS 74 12/02/2017 0846   BILITOT 0.4 12/02/2017 0846      Component Value Date/Time    TSH 1.360 12/02/2017 0846   TSH 1.050 04/29/2017 1146   TSH 1.830 06/30/2016 1804   Results for REESHEMAH, NAZARYAN "KIM Wiehe" (MRN [id number]) as of 08/10/2018 08:02  Ref. Range 03/17/2018 09:32  Vitamin D, 25-Hydroxy Latest Ref Range: 30.0 - 100.0 ng/mL 44.6   I, Michaelene Song, am acting as Location manager for [employer], FNP  I have reviewed the above documentation for accuracy and completeness, and I agree with the above.  - Zamariah Seaborn, FNP-C.

## 2018-08-12 MED FILL — metFORMIN HCL 500 MG TABS: 500 | 90 days supply | Qty: 90 | Fill #0

## 2018-08-16 ENCOUNTER — Encounter: Payer: Self-pay | Admitting: Family Medicine

## 2018-08-16 ENCOUNTER — Other Ambulatory Visit: Payer: Self-pay

## 2018-08-16 ENCOUNTER — Ambulatory Visit (INDEPENDENT_AMBULATORY_CARE_PROVIDER_SITE_OTHER): Payer: 59 | Admitting: Family Medicine

## 2018-08-16 VITALS — BP 133/80 | HR 69 | Temp 98.4°F | Ht 63.0 in | Wt 191.0 lb

## 2018-08-16 DIAGNOSIS — E559 Vitamin D deficiency, unspecified: Secondary | ICD-10-CM | POA: Diagnosis not present

## 2018-08-16 DIAGNOSIS — Z1329 Encounter for screening for other suspected endocrine disorder: Secondary | ICD-10-CM | POA: Diagnosis not present

## 2018-08-16 DIAGNOSIS — Z0001 Encounter for general adult medical examination with abnormal findings: Secondary | ICD-10-CM | POA: Diagnosis not present

## 2018-08-16 DIAGNOSIS — E7849 Other hyperlipidemia: Secondary | ICD-10-CM | POA: Diagnosis not present

## 2018-08-16 DIAGNOSIS — Z1231 Encounter for screening mammogram for malignant neoplasm of breast: Secondary | ICD-10-CM | POA: Diagnosis not present

## 2018-08-16 DIAGNOSIS — Z23 Encounter for immunization: Secondary | ICD-10-CM | POA: Diagnosis not present

## 2018-08-16 DIAGNOSIS — E8881 Metabolic syndrome: Secondary | ICD-10-CM

## 2018-08-16 DIAGNOSIS — Z0000 Encounter for general adult medical examination without abnormal findings: Secondary | ICD-10-CM

## 2018-08-16 MED ORDER — SHINGRIX 50 MCG/0.5ML IM SUSR: 0.5000 mL | Freq: Once | INTRAMUSCULAR | 1 refills | Status: AC

## 2018-08-16 MED FILL — SHINGRIX 50 MCG SUS: 50 | 1 days supply | Qty: 1 | Fill #0

## 2018-08-16 NOTE — Patient Instructions (Addendum)
   If you have lab work done today you will be contacted with your lab results within the next 2 weeks.  If you have not heard from us then please contact us. The fastest way to get your results is to register for My Chart.   IF you received an x-ray today, you will receive an invoice from [facility]. Please contact [facility] at [phone] with questions or concerns regarding your invoice.   IF you received labwork today, you will receive an invoice from [facility]. Please contact [facility] at [phone] with questions or concerns regarding your invoice.   Our billing staff will not be able to assist you with questions regarding bills from these companies.  You will be contacted with the lab results as soon as they are available. The fastest way to get your results is to activate your My Chart account. Instructions are located on the last page of this paperwork. If you have not heard from us regarding the results in 2 weeks, please contact this office.     Preventive Care 40-64 Years Old, Female Preventive care refers to visits with your health care provider and lifestyle choices that can promote health and wellness. This includes:  A yearly physical exam. This may also be called an annual well check.  Regular dental visits and eye exams.  Immunizations.  Screening for certain conditions.  Healthy lifestyle choices, such as eating a healthy diet, getting regular exercise, not using drugs or products that contain nicotine and tobacco, and limiting alcohol use. What can I expect for my preventive care visit? Physical exam Your health care provider will check your:  Height and weight. This may be used to calculate body mass index (BMI), which tells if you are at a healthy weight.  Heart rate and blood pressure.  Skin for abnormal spots. Counseling Your health care provider may ask you questions about your:  Alcohol, tobacco, and drug use.  Emotional  well-being.  Home and relationship well-being.  Sexual activity.  Eating habits.  Work and work environment.  Method of birth control.  Menstrual cycle.  Pregnancy history. What immunizations do I need?  Influenza (flu) vaccine  This is recommended every year. Tetanus, diphtheria, and pertussis (Tdap) vaccine  You may need a Td booster every 10 years. Varicella (chickenpox) vaccine  You may need this if you have not been vaccinated. Zoster (shingles) vaccine  You may need this after age 60. Measles, mumps, and rubella (MMR) vaccine  You may need at least one dose of MMR if you were born in 1957 or later. You may also need a second dose. Pneumococcal conjugate (PCV13) vaccine  You may need this if you have certain conditions and were not previously vaccinated. Pneumococcal polysaccharide (PPSV23) vaccine  You may need one or two doses if you smoke cigarettes or if you have certain conditions. Meningococcal conjugate (MenACWY) vaccine  You may need this if you have certain conditions. Hepatitis A vaccine  You may need this if you have certain conditions or if you travel or work in places where you may be exposed to hepatitis A. Hepatitis B vaccine  You may need this if you have certain conditions or if you travel or work in places where you may be exposed to hepatitis B. Haemophilus influenzae type b (Hib) vaccine  You may need this if you have certain conditions. Human papillomavirus (HPV) vaccine  If recommended by your health care provider, you may need three doses over 6 months.   You may receive vaccines as individual doses or as more than one vaccine together in one shot (combination vaccines). Talk with your health care provider about the risks and benefits of combination vaccines. What tests do I need? Blood tests  Lipid and cholesterol levels. These may be checked every 5 years, or more frequently if you are over 50 years old.  Hepatitis C  test.  Hepatitis B test. Screening  Lung cancer screening. You may have this screening every year starting at age 55 if you have a 30-pack-year history of smoking and currently smoke or have quit within the past 15 years.  Colorectal cancer screening. All adults should have this screening starting at age 50 and continuing until age 75. Your health care provider may recommend screening at age 45 if you are at increased risk. You will have tests every 1-10 years, depending on your results and the type of screening test.  Diabetes screening. This is done by checking your blood sugar (glucose) after you have not eaten for a while (fasting). You may have this done every 1-3 years.  Mammogram. This may be done every 1-2 years. Talk with your health care provider about when you should start having regular mammograms. This may depend on whether you have a family history of breast cancer.  BRCA-related cancer screening. This may be done if you have a family history of breast, ovarian, tubal, or peritoneal cancers.  Pelvic exam and Pap test. This may be done every 3 years starting at age 21. Starting at age 30, this may be done every 5 years if you have a Pap test in combination with an HPV test. Other tests  Sexually transmitted disease (STD) testing.  Bone density scan. This is done to screen for osteoporosis. You may have this scan if you are at high risk for osteoporosis. Follow these instructions at home: Eating and drinking  Eat a diet that includes fresh fruits and vegetables, whole grains, lean protein, and low-fat dairy.  Take vitamin and mineral supplements as recommended by your health care provider.  Do not drink alcohol if: ? Your health care provider tells you not to drink. ? You are pregnant, may be pregnant, or are planning to become pregnant.  If you drink alcohol: ? Limit how much you have to 0-1 drink a day. ? Be aware of how much alcohol is in your drink. In the [place] one  drink equals one 12 oz bottle of beer (355 mL), one 5 oz glass of wine (148 mL), or one 1 oz glass of hard liquor (44 mL). Lifestyle  Take daily care of your teeth and gums.  Stay active. Exercise for at least 30 minutes on 5 or more days each week.  Do not use any products that contain nicotine or tobacco, such as cigarettes, e-cigarettes, and chewing tobacco. If you need help quitting, ask your health care provider.  If you are sexually active, practice safe sex. Use a condom or other form of birth control (contraception) in order to prevent pregnancy and STIs (sexually transmitted infections).  If told by your health care provider, take low-dose aspirin daily starting at age 50. What's next?  Visit your health care provider once a year for a well check visit.  Ask your health care provider how often you should have your eyes and teeth checked.  Stay up to date on all vaccines. This information is not intended to replace advice given to you by your health care provider. Make sure   you discuss any questions you have with your health care provider. Document Released: 02/22/2015 Document Revised: 10/07/2017 Document Reviewed: 10/07/2017 Elsevier Patient Education  2020 Elsevier Inc.  

## 2018-08-16 NOTE — Progress Notes (Signed)
7/7/20201:38 PM  Gabrielle Castro Dec 11, 1955, 63 y.o., female [id number]  Chief Complaint  Patient presents with  . Annual Exam    no longer take the anxiety meds, feels she does not need it. She walks to deal with the anxiety.     HPI:   Patient is a 63 y.o. female with past medical history significant for HLP, Single kidney, prediabetes, COPD, vitamin D deficiency who presents today for CPE  Cervical Cancer Screening: 2018, Dr Philis Pique, h/o abnormal, had LEEP in her 65s, pap normal since then Keeps having small scant bleeding from vaginal wall Breast Cancer Screening: ordered today Colorectal Cancer Screening: cologuard 2019, negative Bone Density Testing: at age 29 HIV Screening: 2018  Most Recent Immunizations  Administered Date(s) Administered  . Influenza Whole 11/10/2015  . Influenza-Unspecified 11/03/2017  . Tdap 03/28/2014   Pneumococcal Vaccination: today Zoster Vaccination: at pharmacy of choice Frequency of Dental evaluation: needs to make appt Frequency of Eye evaluation: needs to make appt  Lab Results  Component Value Date   HGBA1C 5.5 12/02/2017   Main trigger for her anxiety is her adult son with schizophrenia Walking helps a lot   Depression screen [facility] 2/9 08/16/2018 12/02/2017 11/16/2017  Decreased Interest 0 2 0  Down, Depressed, Hopeless 0 1 0  PHQ - 2 Score 0 3 0  Altered sleeping - 1 -  Tired, decreased energy - 3 -  Change in appetite - 1 -  Feeling bad or failure about yourself  - 2 -  Trouble concentrating - 1 -  Moving slowly or fidgety/restless - 2 -  Suicidal thoughts - 0 -  PHQ-9 Score - 13 -  Difficult doing work/chores - Somewhat difficult -    Fall Risk  08/16/2018 11/16/2017 07/19/2017 06/17/2017 06/02/2017  Falls in the past year? 0 No No No No  Number falls in past yr: 0 - - - -  Injury with Fall? 0 - - - -     Allergies  Allergen Reactions  . Nickel Swelling, Hives and Itching  . Tape Rash    Prior to Admission  medications   Medication Sig Start Date End Date Taking? Authorizing Provider  albuterol (VENTOLIN HFA) 108 (90 Base) MCG/ACT inhaler Inhale 2 puffs into the lungs every 4 (four) hours as needed for wheezing or shortness of breath (cough, shortness of breath or wheezing.). 06/28/18  Yes Rutherford Guys, MD  Ascorbic Acid (VITAMIN C) 1000 MG tablet Take 1,000 mg by mouth daily.   Yes [provider]  beclomethasone (QVAR) 40 MCG/ACT inhaler Inhale 2 puffs into the lungs 2 (two) times daily. 07/04/18  Yes Rutherford Guys, MD  Biotin 10000 MCG TABS Take 2 tablets by mouth daily.   Yes [provider]  Calcium 600-200 MG-UNIT tablet Take 1 tablet by mouth daily.   Yes [provider]  cholecalciferol (VITAMIN D3) 25 MCG (1000 UT) tablet Take 2,000 Units by mouth daily.   Yes [provider]  Lysine 1000 MG TABS Take 1 tablet by mouth daily.   Yes [provider]  metFORMIN (GLUCOPHAGE) 500 MG tablet Take 1 tablet (500 mg total) by mouth daily with breakfast. 08/09/18  Yes [place], Dawn W, FNP  SUPER B COMPLEX/C PO Take 2 capsules by mouth daily.   Yes [provider]    Past Medical History:  Diagnosis Date  . Anxiety   . COPD (chronic obstructive pulmonary disease) ([place])   . Dyspnea   . EP (ectopic  pregnancy)   . GERD (gastroesophageal reflux disease)     Past Surgical History:  Procedure Laterality Date  . BREAST SURGERY    . EYE SURGERY    . HAND SURGERY    . KIDNEY DONATION    . KIDNEY DONATION    . TUBAL LIGATION      Social History   Tobacco Use  . Smoking status: Former Smoker    Packs/day: 1.00    Years: 41.00    Pack years: 41.00    Types: Cigarettes    Quit date: 03/03/2016    Years since quitting: 2.4  . Smokeless tobacco: Never Used  Substance Use Topics  . Alcohol use: Yes    Family History  Problem Relation Age of Onset  . Lung cancer Father        smoked  . Cancer Father        lung; +TOB  . Cancer  Paternal Grandmother        "every kind of cancer"  . Heart disease Mother   . Diabetes Mother   . Hypertension Mother   . Hyperlipidemia Mother     Review of Systems  Constitutional: Negative for chills and fever.  Respiratory: Positive for cough and shortness of breath.        At baseline  Cardiovascular: Negative for chest pain, palpitations and leg swelling.  Gastrointestinal: Negative for abdominal pain, nausea and vomiting.  All other systems reviewed and are negative. per hpi   OBJECTIVE:  Today's Vitals   08/16/18 1327  BP: 133/80  Pulse: 69  Temp: 98.4 F (36.9 C)  TempSrc: Oral  SpO2: 95%  Weight: 191 lb (86.6 kg)  Height: '5\' 3"'  (1.6 m)   Body mass index is 33.83 kg/m.   Hearing Screening   '125Hz'  '250Hz'  '500Hz'  '1000Hz'  '2000Hz'  '3000Hz'  '4000Hz'  '6000Hz'  '8000Hz'   Right ear:           Left ear:             Visual Acuity Screening   Right eye Left eye Both eyes  Without correction: '20/40 20/30 20/40 '  With correction:       Physical Exam Vitals signs and nursing note reviewed. Exam conducted with a chaperone present.  Constitutional:      Appearance: She is well-developed.  HENT:     Head: Normocephalic and atraumatic.     Right Ear: Hearing, tympanic membrane, ear canal and external ear normal.     Left Ear: Hearing, tympanic membrane, ear canal and external ear normal.     Mouth/Throat:     Mouth: Mucous membranes are moist.     Pharynx: No oropharyngeal exudate or posterior oropharyngeal erythema.  Eyes:     Extraocular Movements: Extraocular movements intact.     Conjunctiva/sclera: Conjunctivae normal.     Pupils: Pupils are equal, round, and reactive to light.  Neck:     Musculoskeletal: Neck supple.     Thyroid: No thyromegaly.  Cardiovascular:     Rate and Rhythm: Normal rate and regular rhythm.     Heart sounds: Normal heart sounds. No murmur. No friction rub. No gallop.   Pulmonary:     Effort: Pulmonary effort is normal.     Breath sounds:  Normal breath sounds. No wheezing, rhonchi or rales.  Chest:     Breasts:        Right: No mass, nipple discharge or skin change.        Left: No mass, nipple discharge or  skin change.  Abdominal:     General: Bowel sounds are normal. There is no distension.     Palpations: Abdomen is soft. There is no hepatomegaly, splenomegaly or mass.     Tenderness: There is no abdominal tenderness.  Musculoskeletal: Normal range of motion.     Right lower leg: No edema.     Left lower leg: No edema.  Lymphadenopathy:     Cervical: No cervical adenopathy.     Upper Body:     Right upper body: No supraclavicular, axillary or pectoral adenopathy.     Left upper body: No supraclavicular, axillary or pectoral adenopathy.  Skin:    General: Skin is warm and dry.  Neurological:     Mental Status: She is alert and oriented to person, place, and time.     Cranial Nerves: No cranial nerve deficit.     Gait: Gait normal.     Deep Tendon Reflexes: Reflexes are normal and symmetric.  Psychiatric:        Mood and Affect: Mood normal.        Behavior: Behavior normal.     ASSESSMENT and PLAN  1. Annual physical exam Routine HCM labs ordered. HCM reviewed/discussed. Anticipatory guidance regarding healthy weight, lifestyle and choices given. Patient advised to see Dr Philis Pique for vaginal area with recurring bleeding.  2. Other hyperlipidemia - Lipid panel - CMP14+EGFR  3. Screening for thyroid disorder - TSH  4. Insulin resistance - Hemoglobin A1c  5. Vitamin D deficiency  6. Visit for screening mammogram - MM DIGITAL SCREENING BILATERAL; Future  Other orders - Pneumococcal polysaccharide vaccine 23-valent greater than or equal to 2yo subcutaneous/IM - Zoster Vaccine Adjuvanted [facility]) injection; Inject 0.5 mLs into the muscle once for 1 dose.  Return in about 1 year (around 08/16/2019).    Rutherford Guys, MD [facility] [place], [place] [address] Ph.   [phone] Fax [phone]

## 2018-08-17 LAB — CMP14+EGFR
ALT: 17 IU/L (ref 0–32)
AST: 19 IU/L (ref 0–40)
Albumin/Globulin Ratio: 1.8 (ref 1.2–2.2)
Albumin: 4.2 g/dL (ref 3.8–4.8)
Alkaline Phosphatase: 70 IU/L (ref 39–117)
BUN/Creatinine Ratio: 15 (ref 12–28)
BUN: 15 mg/dL (ref 8–27)
Bilirubin Total: 0.3 mg/dL (ref 0.0–1.2)
CO2: 21 mmol/L (ref 20–29)
Calcium: 9.4 mg/dL (ref 8.7–10.3)
Chloride: 103 mmol/L (ref 96–106)
Creatinine, Ser: 1.01 mg/dL — ABNORMAL HIGH (ref 0.57–1.00)
GFR calc Af Amer: 69 mL/min/{1.73_m2} (ref >59)
GFR calc non Af Amer: 60 mL/min/{1.73_m2} (ref >59)
Globulin, Total: 2.3 g/dL (ref 1.5–4.5)
Glucose: 78 mg/dL (ref 65–99)
Potassium: 4.4 mmol/L (ref 3.5–5.2)
Sodium: 139 mmol/L (ref 134–144)
Total Protein: 6.5 g/dL (ref 6.0–8.5)

## 2018-08-17 LAB — LIPID PANEL
Chol/HDL Ratio: 2.4 ratio (ref 0.0–4.4)
Cholesterol, Total: 205 mg/dL — ABNORMAL HIGH (ref 100–199)
HDL: 86 mg/dL (ref >39)
LDL Calculated: 106 mg/dL — ABNORMAL HIGH (ref 0–99)
Triglycerides: 67 mg/dL (ref 0–149)
VLDL Cholesterol Cal: 13 mg/dL (ref 5–40)

## 2018-08-17 LAB — HEMOGLOBIN A1C
Est. average glucose Bld gHb Est-mCnc: 108 mg/dL
Hgb A1c MFr Bld: 5.4 % (ref 4.8–5.6)

## 2018-08-17 LAB — TSH: TSH: 0.574 u[IU]/mL (ref 0.450–4.500)

## 2018-08-29 MED FILL — QVAR REDIHALER 40 MCG/ACT A: 40 | 30 days supply | Qty: 11 | Fill #3

## 2018-09-01 DIAGNOSIS — N898 Other specified noninflammatory disorders of vagina: Secondary | ICD-10-CM | POA: Diagnosis not present

## 2018-09-01 MED FILL — ESTRADIOL 0.1 MG/GM CREA: 0.1 | 30 days supply | Qty: 43 | Fill #0

## 2018-09-27 ENCOUNTER — Telehealth (INDEPENDENT_AMBULATORY_CARE_PROVIDER_SITE_OTHER): Payer: 59 | Admitting: Family Medicine

## 2018-09-27 ENCOUNTER — Encounter (INDEPENDENT_AMBULATORY_CARE_PROVIDER_SITE_OTHER): Payer: Self-pay | Admitting: Family Medicine

## 2018-09-27 ENCOUNTER — Other Ambulatory Visit: Payer: Self-pay

## 2018-09-27 DIAGNOSIS — E8881 Metabolic syndrome: Secondary | ICD-10-CM

## 2018-09-27 DIAGNOSIS — Z6833 Body mass index (BMI) 33.0-33.9, adult: Secondary | ICD-10-CM

## 2018-09-27 DIAGNOSIS — E669 Obesity, unspecified: Secondary | ICD-10-CM

## 2018-09-28 NOTE — Progress Notes (Signed)
Office: [phone]  /  Fax: [phone] TeleHealth Visit:  Gabrielle Castro has verbally consented to this TeleHealth visit today. The patient is located in the car, the provider is located at the [facility] office. The participants in this visit include the listed provider and patient. The visit was conducted today via FaceTime.  HPI:   Chief Complaint: OBESITY Gabrielle Castro is here to discuss her progress with her obesity treatment plan. She is on the Category 3 plan + 100 calories and is following her eating plan approximately 99% of the time. She states she is exercising 0 minutes 0 times per week. Gabrielle Castro's last virtual office visit was 08/09/2018 at which time she weighed 186.3; today she weighs 186 lbs. She is not exercising but plans on starting back. She has been eating Kodiak oatmeal for breakfast. We were unable to weigh the patient today for this TeleHealth visit. She feels as if she has maintained her weight since her last visit. She has lost 5 lbs since starting treatment with [place].  Insulin Resistance Gabrielle Castro has a diagnosis of insulin resistance based on her elevated fasting insulin level >5. Although Gabrielle Castro's blood glucose readings are still under good control, insulin resistance puts her at greater risk of metabolic syndrome and diabetes. She is taking metformin currently without side effects and continues to work on diet and exercise to decrease risk of diabetes. No polyphagia.  ASSESSMENT AND PLAN:  Insulin resistance  Class 1 obesity with serious comorbidity and body mass index (BMI) of 33.0 to 33.9 in adult, unspecified obesity type  PLAN:  Insulin Resistance [name] will continue to work on weight loss, exercise, and decreasing simple carbohydrates in her diet to help decrease the risk of diabetes. We dicussed metformin including benefits and risks. She was informed that eating too many simple carbohydrates or too many calories at one sitting increases  the likelihood of GI side effects. Gabrielle Castro will continue metformin and follow-up with [place] as directed to monitor her progress.  Obesity Gabrielle Castro is currently in the action stage of change. As such, her goal is to continue with weight loss efforts. She has agreed to follow the Category 2 plan with breakfast options. I advised her to stop eating Kodiak oatmeal for breakfast because it is possibly too many calories for her plan. We will take away the 100 extra calories for snacks at this time t see if it is impeding her weight loss.  Gabrielle Castro has been instructed to increase walking to 30 minutes 5 days a week for weight loss and overall health benefits. We discussed the following Behavioral Modification Strategies today: planning for success.  Gabrielle Castro has agreed to follow-up with our clinic in 4 weeks. She was informed of the importance of frequent follow-up visits to maximize her success with intensive lifestyle modifications for her multiple health conditions.  ALLERGIES: Allergies  Allergen Reactions  . Nickel Swelling, Hives and Itching  . Tape Rash    MEDICATIONS: Current Outpatient Medications on File Prior to Visit  Medication Sig Dispense Refill  . albuterol (VENTOLIN HFA) 108 (90 Base) MCG/ACT inhaler Inhale 2 puffs into the lungs every 4 (four) hours as needed for wheezing or shortness of breath (cough, shortness of breath or wheezing.). 1 Inhaler 0  . Ascorbic Acid (VITAMIN C) 1000 MG tablet Take 1,000 mg by mouth daily.    . beclomethasone (QVAR) 40 MCG/ACT inhaler Inhale 2 puffs into the lungs 2 (two) times daily. 10.6 g 3  . Biotin 10000 MCG TABS  Take 2 tablets by mouth daily.    . Calcium 600-200 MG-UNIT tablet Take 1 tablet by mouth daily.    . cholecalciferol (VITAMIN D3) 25 MCG (1000 UT) tablet Take 2,000 Units by mouth daily.    Marland Kitchen Lysine 1000 MG TABS Take 1 tablet by mouth daily.    . metFORMIN (GLUCOPHAGE) 500 MG tablet Take 1 tablet (500 mg total) by mouth daily with  breakfast. 90 tablet 0  . SUPER B COMPLEX/C PO Take 2 capsules by mouth daily.     No current facility-administered medications on file prior to visit.     PAST MEDICAL HISTORY: Past Medical History:  Diagnosis Date  . Anxiety   . COPD (chronic obstructive pulmonary disease) ([place])   . Dyspnea   . EP (ectopic pregnancy)   . GERD (gastroesophageal reflux disease)     PAST SURGICAL HISTORY: Past Surgical History:  Procedure Laterality Date  . BREAST SURGERY    . EYE SURGERY    . HAND SURGERY    . KIDNEY DONATION    . KIDNEY DONATION    . TUBAL LIGATION      SOCIAL HISTORY: Social History   Tobacco Use  . Smoking status: Former Smoker    Packs/day: 1.00    Years: 41.00    Pack years: 41.00    Types: Cigarettes    Quit date: 03/03/2016    Years since quitting: 2.5  . Smokeless tobacco: Never Used  Substance Use Topics  . Alcohol use: Yes  . Drug use: Never    FAMILY HISTORY: Family History  Problem Relation Age of Onset  . Lung cancer Father        smoked  . Cancer Father        lung; +TOB  . Cancer Paternal Grandmother        "every kind of cancer"  . Heart disease Mother   . Diabetes Mother   . Hypertension Mother   . Hyperlipidemia Mother    ROS: Review of Systems  Endo/Heme/Allergies:       Negative for polyphagia.   PHYSICAL EXAM: Pt in no acute distress  RECENT LABS AND TESTS: BMET    Component Value Date/Time   NA 139 08/16/2018 1556   K 4.4 08/16/2018 1556   CL 103 08/16/2018 1556   CO2 21 08/16/2018 1556   GLUCOSE 78 08/16/2018 1556   BUN 15 08/16/2018 1556   CREATININE 1.01 (H) 08/16/2018 1556   CALCIUM 9.4 08/16/2018 1556   GFRNONAA 60 08/16/2018 1556   GFRAA 69 08/16/2018 1556   Lab Results  Component Value Date   HGBA1C 5.4 08/16/2018   HGBA1C 5.5 12/02/2017   Lab Results  Component Value Date   INSULIN 8.2 12/02/2017   CBC    Component Value Date/Time   WBC 6.3 12/02/2017 0846   WBC 8.5 07/19/2017 1535   RBC 4.63  12/02/2017 0846   RBC 4.93 07/19/2017 1535   HGB 13.9 12/02/2017 0846   HCT 42.5 12/02/2017 0846   PLT 230 04/29/2017 1146   MCV 92 12/02/2017 0846   MCH 30.0 12/02/2017 0846   MCH 29.3 07/19/2017 1535   MCHC 32.7 12/02/2017 0846   MCHC 32.7 07/19/2017 1535   RDW 13.2 12/02/2017 0846   LYMPHSABS 1.8 12/02/2017 0846   EOSABS 0.3 12/02/2017 0846   BASOSABS 0.1 12/02/2017 0846   Iron/TIBC/Ferritin/ %Sat No results found for: IRON, TIBC, FERRITIN, IRONPCTSAT Lipid Panel     Component Value Date/Time   CHOL  205 (H) 08/16/2018 1556   TRIG 67 08/16/2018 1556   HDL 86 08/16/2018 1556   CHOLHDL 2.4 08/16/2018 1556   LDLCALC 106 (H) 08/16/2018 1556   Hepatic Function Panel     Component Value Date/Time   PROT 6.5 08/16/2018 1556   ALBUMIN 4.2 08/16/2018 1556   AST 19 08/16/2018 1556   ALT 17 08/16/2018 1556   ALKPHOS 70 08/16/2018 1556   BILITOT 0.3 08/16/2018 1556      Component Value Date/Time   TSH 0.574 08/16/2018 1556   TSH 1.360 12/02/2017 0846   TSH 1.050 04/29/2017 1146   Results for VALDA, CHRISTENSON "KIM HITCHMAN" (MRN [id number]) as of 09/28/2018 10:20  Ref. Range 03/17/2018 09:32  Vitamin D, 25-Hydroxy Latest Ref Range: 30.0 - 100.0 ng/mL 44.6   I, Michaelene Song, am acting as Location manager for [employer], FNP  I have reviewed the above documentation for accuracy and completeness, and I agree with the above.  - Quaid Yeakle, FNP-C.

## 2018-10-03 MED FILL — QVAR REDIHALER 40 MCG/ACT A: 40 | 30 days supply | Qty: 11 | Fill #4

## 2018-10-05 ENCOUNTER — Ambulatory Visit
Admission: RE | Admit: 2018-10-05 | Discharge: 2018-10-05 | Disposition: A | Discharge status: Home / Self Care | Payer: 59 | Source: Ambulatory Visit | Attending: Family Medicine | Admitting: Family Medicine

## 2018-10-05 ENCOUNTER — Other Ambulatory Visit: Payer: Self-pay

## 2018-10-05 DIAGNOSIS — Z1231 Encounter for screening mammogram for malignant neoplasm of breast: Secondary | ICD-10-CM

## 2018-10-24 ENCOUNTER — Ambulatory Visit (INDEPENDENT_AMBULATORY_CARE_PROVIDER_SITE_OTHER): Payer: 59 | Admitting: Family Medicine

## 2018-10-24 ENCOUNTER — Other Ambulatory Visit: Payer: Self-pay

## 2018-10-24 VITALS — BP 105/74 | HR 64 | Temp 98.0°F | Ht 63.0 in | Wt 184.6 lb

## 2018-10-24 DIAGNOSIS — Z6832 Body mass index (BMI) 32.0-32.9, adult: Secondary | ICD-10-CM

## 2018-10-24 DIAGNOSIS — Z9189 Other specified personal risk factors, not elsewhere classified: Secondary | ICD-10-CM

## 2018-10-24 DIAGNOSIS — E559 Vitamin D deficiency, unspecified: Secondary | ICD-10-CM | POA: Diagnosis not present

## 2018-10-24 DIAGNOSIS — E8881 Metabolic syndrome: Secondary | ICD-10-CM

## 2018-10-24 DIAGNOSIS — E669 Obesity, unspecified: Secondary | ICD-10-CM | POA: Diagnosis not present

## 2018-10-24 DIAGNOSIS — E88819 Insulin resistance, unspecified: Secondary | ICD-10-CM

## 2018-10-24 MED ORDER — METFORMIN HCL 500 MG PO TABS: 500.0000 mg | ORAL_TABLET | Freq: Every day | ORAL | 0 refills | Status: DC

## 2018-10-24 NOTE — Progress Notes (Signed)
Office: [phone]  /  Fax: [phone]   HPI:   Chief Complaint: OBESITY Gabrielle Castro is here to discuss her progress with her obesity treatment plan. She is on the Category 2 plan and is following her eating plan approximately 95 % of the time. She states she is exercising 0 minutes 0 times per week. Gabrielle Castro stopped eating Kodiak oatmeal and she is now eating Special K cereal. She is struggling with polyphagia in the morning. She used to delay eating breakfast and have coffee instead. Her weight is 184 lb 9.6 oz (83.7 kg) today and has had a weight loss of 1 pound since her last in-office visit. She has lost 6 lbs since starting treatment with [place].  Insulin Resistance Gabrielle Castro has a diagnosis of insulin resistance based on her elevated fasting insulin level >5. Although Gabrielle Castro's blood glucose readings are still under good control, insulin resistance puts her at greater risk of metabolic syndrome and diabetes. She is having polyphagia between breakfast and lunch despite taking metformin in the morning. She continues to work on diet and exercise to decrease risk of diabetes.  At risk for diabetes Mane is at higher than average risk for developing diabetes due to her obesity and insulin resistance. She currently denies polyuria or polydipsia.  Vitamin D deficiency Gabrielle Castro has a diagnosis of vitamin D deficiency. Gabrielle Castro is currently taking OTC vit D 2,000 IU daily and she denies nausea, vomiting or muscle weakness.  ASSESSMENT AND PLAN:  Insulin resistance - Plan: metFORMIN (GLUCOPHAGE) 500 MG tablet  Vitamin D deficiency  At risk for diabetes mellitus  Class 1 obesity with serious comorbidity and body mass index (BMI) of 32.0 to 32.9 in adult, unspecified obesity type  PLAN:  Insulin Resistance Gabrielle Castro will continue to work on weight loss, exercise, and decreasing simple carbohydrates in her diet to help decrease the risk of diabetes. We dicussed metformin including  benefits and risks. She was informed that eating too many simple carbohydrates or too many calories at one sitting increases the likelihood of GI side effects. [place] will have coffee first thing in the morning and she will delay breakfast a few hours. Gabrielle Castro agrees to continue metformin 500 mg qAM #90 with no refills and follow up with [place] as directed to monitor her progress.  Diabetes risk counseling Gabrielle Castro was given extended (15 minutes) diabetes prevention counseling today. She is 63 y.o. female and has risk factors for diabetes including obesity and insulin resistance. We discussed intensive lifestyle modifications today with an emphasis on weight loss as well as increasing exercise and decreasing simple carbohydrates in her diet.  Vitamin D Deficiency Gabrielle Castro was informed that low vitamin D levels contributes to fatigue and are associated with obesity, breast, and colon cancer. Gabrielle Castro will continue to take OTC Vit D @2 ,000 IU daily and she will follow up for routine testing of vitamin D, at least 2-3 times per year. She was informed of the risk of over-replacement of vitamin D and agrees to not increase her dose unless she discusses this with [place] first.  Obesity Gabrielle Castro is currently in the action stage of change. As such, her goal is to continue with weight loss efforts She has agreed to follow the Category 2 plan Gabrielle Castro will find a way to add in exercise for weight loss and overall health benefits. We discussed the following Behavioral Modification Strategies today: planning for success and decreasing simple carbohydrates   [place] has agreed to follow up with our clinic in 4 weeks. She  was informed of the importance of frequent follow up visits to maximize her success with intensive lifestyle modifications for her multiple health conditions.  ALLERGIES: Allergies  Allergen Reactions  . Nickel Swelling, Hives and Itching  . Tape Rash    MEDICATIONS: Current Outpatient  Medications on File Prior to Visit  Medication Sig Dispense Refill  . albuterol (VENTOLIN HFA) 108 (90 Base) MCG/ACT inhaler Inhale 2 puffs into the lungs every 4 (four) hours as needed for wheezing or shortness of breath (cough, shortness of breath or wheezing.). 1 Inhaler 0  . Ascorbic Acid (VITAMIN C) 1000 MG tablet Take 1,000 mg by mouth daily.    . beclomethasone (QVAR) 40 MCG/ACT inhaler Inhale 2 puffs into the lungs 2 (two) times daily. 10.6 g 3  . Biotin 10000 MCG TABS Take 2 tablets by mouth daily.    . Calcium 600-200 MG-UNIT tablet Take 1 tablet by mouth daily.    . cholecalciferol (VITAMIN D3) 25 MCG (1000 UT) tablet Take 2,000 Units by mouth daily.    Marland Kitchen Lysine 1000 MG TABS Take 1 tablet by mouth daily.    . SUPER B COMPLEX/C PO Take 2 capsules by mouth daily.     No current facility-administered medications on file prior to visit.     PAST MEDICAL HISTORY: Past Medical History:  Diagnosis Date  . Anxiety   . COPD (chronic obstructive pulmonary disease) ([place])   . Dyspnea   . EP (ectopic pregnancy)   . GERD (gastroesophageal reflux disease)     PAST SURGICAL HISTORY: Past Surgical History:  Procedure Laterality Date  . BREAST EXCISIONAL BIOPSY Right 10+ years ago   benign  . BREAST SURGERY    . EYE SURGERY    . HAND SURGERY    . KIDNEY DONATION    . KIDNEY DONATION    . TUBAL LIGATION      SOCIAL HISTORY: Social History   Tobacco Use  . Smoking status: Former Smoker    Packs/day: 1.00    Years: 41.00    Pack years: 41.00    Types: Cigarettes    Quit date: 03/03/2016    Years since quitting: 2.6  . Smokeless tobacco: Never Used  Substance Use Topics  . Alcohol use: Yes  . Drug use: Never    FAMILY HISTORY: Family History  Problem Relation Age of Onset  . Lung cancer Father        smoked  . Cancer Father        lung; +TOB  . Cancer Paternal Grandmother        "every kind of cancer"  . Heart disease Mother   . Diabetes Mother   . Hypertension  Mother   . Hyperlipidemia Mother     ROS: Review of Systems  Constitutional: Positive for weight loss.  Gastrointestinal: Negative for nausea and vomiting.  Genitourinary: Negative for frequency.  Musculoskeletal:       Negative for muscle weakness  Endo/Heme/Allergies: Negative for polydipsia.       Positive for polyphagia    PHYSICAL EXAM: Blood pressure 105/74, pulse 64, temperature 98 F (36.7 C), temperature source Oral, height 5\' 3"  (1.6 m), weight 184 lb 9.6 oz (83.7 kg), SpO2 97 %. Body mass index is 32.7 kg/m. Physical Exam Vitals signs reviewed.  Constitutional:      Appearance: Normal appearance. She is well-developed. She is obese.  Cardiovascular:     Rate and Rhythm: Normal rate.  Pulmonary:     Effort: Pulmonary effort  is normal.  Musculoskeletal: Normal range of motion.  Skin:    General: Skin is warm and dry.  Neurological:     Mental Status: She is alert and oriented to person, place, and time.  Psychiatric:        Mood and Affect: Mood normal.        Behavior: Behavior normal.     RECENT LABS AND TESTS: BMET    Component Value Date/Time   NA 139 08/16/2018 1556   K 4.4 08/16/2018 1556   CL 103 08/16/2018 1556   CO2 21 08/16/2018 1556   GLUCOSE 78 08/16/2018 1556   BUN 15 08/16/2018 1556   CREATININE 1.01 (H) 08/16/2018 1556   CALCIUM 9.4 08/16/2018 1556   GFRNONAA 60 08/16/2018 1556   GFRAA 69 08/16/2018 1556   Lab Results  Component Value Date   HGBA1C 5.4 08/16/2018   HGBA1C 5.5 12/02/2017   Lab Results  Component Value Date   INSULIN 8.2 12/02/2017   CBC    Component Value Date/Time   WBC 6.3 12/02/2017 0846   WBC 8.5 07/19/2017 1535   RBC 4.63 12/02/2017 0846   RBC 4.93 07/19/2017 1535   HGB 13.9 12/02/2017 0846   HCT 42.5 12/02/2017 0846   PLT 230 04/29/2017 1146   MCV 92 12/02/2017 0846   MCH 30.0 12/02/2017 0846   MCH 29.3 07/19/2017 1535   MCHC 32.7 12/02/2017 0846   MCHC 32.7 07/19/2017 1535   RDW 13.2  12/02/2017 0846   LYMPHSABS 1.8 12/02/2017 0846   EOSABS 0.3 12/02/2017 0846   BASOSABS 0.1 12/02/2017 0846   Iron/TIBC/Ferritin/ %Sat No results found for: IRON, TIBC, FERRITIN, IRONPCTSAT Lipid Panel     Component Value Date/Time   CHOL 205 (H) 08/16/2018 1556   TRIG 67 08/16/2018 1556   HDL 86 08/16/2018 1556   CHOLHDL 2.4 08/16/2018 1556   LDLCALC 106 (H) 08/16/2018 1556   Hepatic Function Panel     Component Value Date/Time   PROT 6.5 08/16/2018 1556   ALBUMIN 4.2 08/16/2018 1556   AST 19 08/16/2018 1556   ALT 17 08/16/2018 1556   ALKPHOS 70 08/16/2018 1556   BILITOT 0.3 08/16/2018 1556      Component Value Date/Time   TSH 0.574 08/16/2018 1556   TSH 1.360 12/02/2017 0846   TSH 1.050 04/29/2017 1146    Results for KAYTLEN, DEMETRIUS "KIM TAPE" (MRN [id number]) as of 10/24/2018 18:27  Ref. Range 03/17/2018 09:32  Vitamin D, 25-Hydroxy Latest Ref Range: 30.0 - 100.0 ng/mL 44.6    OBESITY BEHAVIORAL INTERVENTION VISIT  Today's visit was # 16   Starting weight: 190 lbs Starting date: 12/02/2017 Today's weight : 184 lbs Today's date: 10/24/2018 Total lbs lost to date: 6    10/24/2018  Height 5\' 3"  (1.6 m)  Weight 184 lb 9.6 oz (83.7 kg)  BMI (Calculated) 32.71  BLOOD PRESSURE - SYSTOLIC 123456  BLOOD PRESSURE - DIASTOLIC 74   Body Fat % 0000000 %  Total Body Water (lbs) 70.8 lbs    ASK: We discussed the diagnosis of obesity with Gabrielle Castro today and Gabrielle Castro agreed to give [place] permission to discuss obesity behavioral modification therapy today.  ASSESS: Gabrielle Castro has the diagnosis of obesity and her BMI today is 32.71 Gabrielle Castro is in the action stage of change   ADVISE: Gabrielle Castro was educated on the multiple health risks of obesity as well as the benefit of weight loss to improve her health. She was advised of the need for long  term treatment and the importance of lifestyle modifications to improve her current health and to decrease her risk of future health  problems.  AGREE: Multiple dietary modification options and treatment options were discussed and  Gabrielle Castro agreed to follow the recommendations documented in the above note.  ARRANGE: Gabrielle Castro was educated on the importance of frequent visits to treat obesity as outlined per CMS and USPSTF guidelines and agreed to schedule her next follow up appointment today.  I, Doreene Nest, am acting as transcriptionist for [employer], FNP-C  I have reviewed the above documentation for accuracy and completeness, and I agree with the above.  - Gabrielle Busby, FNP-C.

## 2018-10-25 ENCOUNTER — Ambulatory Visit (INDEPENDENT_AMBULATORY_CARE_PROVIDER_SITE_OTHER): Payer: 59 | Admitting: Physician Assistant

## 2018-10-26 ENCOUNTER — Encounter (INDEPENDENT_AMBULATORY_CARE_PROVIDER_SITE_OTHER): Payer: Self-pay | Admitting: Family Medicine

## 2018-10-27 MED FILL — QVAR REDIHALER 40 MCG/ACT A: 40 | 30 days supply | Qty: 11 | Fill #5

## 2018-11-21 ENCOUNTER — Ambulatory Visit (INDEPENDENT_AMBULATORY_CARE_PROVIDER_SITE_OTHER): Payer: 59 | Admitting: Family Medicine

## 2018-11-21 ENCOUNTER — Other Ambulatory Visit: Payer: Self-pay

## 2018-11-21 VITALS — BP 118/82 | HR 72 | Temp 98.3°F | Ht 63.0 in | Wt 187.0 lb

## 2018-11-21 DIAGNOSIS — E669 Obesity, unspecified: Secondary | ICD-10-CM

## 2018-11-21 DIAGNOSIS — Z6833 Body mass index (BMI) 33.0-33.9, adult: Secondary | ICD-10-CM | POA: Diagnosis not present

## 2018-11-21 DIAGNOSIS — E559 Vitamin D deficiency, unspecified: Secondary | ICD-10-CM

## 2018-11-22 NOTE — Progress Notes (Signed)
Office: [phone]  /  Fax: [phone]   HPI:   Chief Complaint: OBESITY Gabrielle Castro is here to discuss her progress with her obesity treatment plan. She is on the Category 2 plan and is following her eating plan approximately 75 % of the time. She states she is walking 30 to 60 minutes 5 times per week. Gabrielle Castro ate at [facility] yesterday. She occasionally goes over at 200 extra calories. Her weight is 187 lb (84.8 kg) today and has had a weight gain of 3 pounds over a period of 4 weeks since her last visit. She has lost 3 lbs since starting treatment with [place].  Vitamin D deficiency [name] has a diagnosis of vitamin D deficiency. Gabrielle Castro is currently taking OTC vit D, and she is not yet at goal. She denies nausea, vomiting or muscle weakness.  ASSESSMENT AND PLAN:  Vitamin D deficiency  Class 1 obesity with serious comorbidity and body mass index (BMI) of 33.0 to 33.9 in adult, unspecified obesity type  PLAN:  Vitamin D Deficiency Gabrielle Castro was informed that low vitamin D levels contributes to fatigue and are associated with obesity, breast, and colon cancer. Gabrielle Castro will continue to take OTC Vit D @2 ,000 daily and she will follow up for routine testing of vitamin D, at least 2-3 times per year. She was informed of the risk of over-replacement of vitamin D and agrees to not increase her dose unless she discusses this with [place] first.   Obesity Gabrielle Castro is currently in the action stage of change. As such, her goal is to continue with weight loss efforts She has agreed to follow the Category 2 plan Gabrielle Castro will continue walking 30 to 60 minutes, 5 times per week for weight loss and overall health benefits. We discussed the following Behavioral Modification Strategies today: planning for success and decreasing simple carbohydrates   We discussed the low carbohydrate plan today and she declined.  Gabrielle Castro has agreed to follow up with our clinic in 2 weeks. She was informed of the  importance of frequent follow up visits to maximize her success with intensive lifestyle modifications for her multiple health conditions.  ALLERGIES: Allergies  Allergen Reactions  . Nickel Swelling, Hives and Itching  . Tape Rash    MEDICATIONS: Current Outpatient Medications on File Prior to Visit  Medication Sig Dispense Refill  . albuterol (VENTOLIN HFA) 108 (90 Base) MCG/ACT inhaler Inhale 2 puffs into the lungs every 4 (four) hours as needed for wheezing or shortness of breath (cough, shortness of breath or wheezing.). 1 Inhaler 0  . Ascorbic Acid (VITAMIN C) 1000 MG tablet Take 1,000 mg by mouth daily.    . beclomethasone (QVAR) 40 MCG/ACT inhaler Inhale 2 puffs into the lungs 2 (two) times daily. 10.6 g 3  . Biotin 10000 MCG TABS Take 2 tablets by mouth daily.    . Calcium 600-200 MG-UNIT tablet Take 1 tablet by mouth daily.    . cholecalciferol (VITAMIN D3) 25 MCG (1000 UT) tablet Take 2,000 Units by mouth daily.    Marland Kitchen Lysine 1000 MG TABS Take 1 tablet by mouth daily.    . SUPER B COMPLEX/C PO Take 2 capsules by mouth daily.    . metFORMIN (GLUCOPHAGE) 500 MG tablet Take 1 tablet (500 mg total) by mouth daily with breakfast. (Patient not taking: Reported on 11/21/2018) 90 tablet 0   No current facility-administered medications on file prior to visit.     PAST MEDICAL HISTORY: Past Medical History:  Diagnosis Date  .  Anxiety   . COPD (chronic obstructive pulmonary disease) ([place])   . Dyspnea   . EP (ectopic pregnancy)   . GERD (gastroesophageal reflux disease)     PAST SURGICAL HISTORY: Past Surgical History:  Procedure Laterality Date  . BREAST EXCISIONAL BIOPSY Right 10+ years ago   benign  . BREAST SURGERY    . EYE SURGERY    . HAND SURGERY    . KIDNEY DONATION    . KIDNEY DONATION    . TUBAL LIGATION      SOCIAL HISTORY: Social History   Tobacco Use  . Smoking status: Former Smoker    Packs/day: 1.00    Years: 41.00    Pack years: 41.00    Types:  Cigarettes    Quit date: 03/03/2016    Years since quitting: 2.7  . Smokeless tobacco: Never Used  Substance Use Topics  . Alcohol use: Yes  . Drug use: Never    FAMILY HISTORY: Family History  Problem Relation Age of Onset  . Lung cancer Father        smoked  . Cancer Father        lung; +TOB  . Cancer Paternal Grandmother        "every kind of cancer"  . Heart disease Mother   . Diabetes Mother   . Hypertension Mother   . Hyperlipidemia Mother     ROS: Review of Systems  Constitutional: Negative for weight loss.  Gastrointestinal: Negative for nausea and vomiting.  Musculoskeletal:       Negative for muscle weakness    PHYSICAL EXAM: Blood pressure 118/82, pulse 72, temperature 98.3 F (36.8 C), temperature source Oral, height 5\' 3"  (1.6 m), weight 187 lb (84.8 kg), SpO2 96 %. Body mass index is 33.13 kg/m. Physical Exam Vitals signs reviewed.  Constitutional:      Appearance: Normal appearance. She is well-developed. She is obese.  Cardiovascular:     Rate and Rhythm: Normal rate.  Pulmonary:     Effort: Pulmonary effort is normal.  Musculoskeletal: Normal range of motion.  Skin:    General: Skin is warm and dry.  Neurological:     Mental Status: She is alert and oriented to person, place, and time.  Psychiatric:        Mood and Affect: Mood normal.        Behavior: Behavior normal.     RECENT LABS AND TESTS: BMET    Component Value Date/Time   NA 139 08/16/2018 1556   K 4.4 08/16/2018 1556   CL 103 08/16/2018 1556   CO2 21 08/16/2018 1556   GLUCOSE 78 08/16/2018 1556   BUN 15 08/16/2018 1556   CREATININE 1.01 (H) 08/16/2018 1556   CALCIUM 9.4 08/16/2018 1556   GFRNONAA 60 08/16/2018 1556   GFRAA 69 08/16/2018 1556   Lab Results  Component Value Date   HGBA1C 5.4 08/16/2018   HGBA1C 5.5 12/02/2017   Lab Results  Component Value Date   INSULIN 8.2 12/02/2017   CBC    Component Value Date/Time   WBC 6.3 12/02/2017 0846   WBC 8.5  07/19/2017 1535   RBC 4.63 12/02/2017 0846   RBC 4.93 07/19/2017 1535   HGB 13.9 12/02/2017 0846   HCT 42.5 12/02/2017 0846   PLT 230 04/29/2017 1146   MCV 92 12/02/2017 0846   MCH 30.0 12/02/2017 0846   MCH 29.3 07/19/2017 1535   MCHC 32.7 12/02/2017 0846   MCHC 32.7 07/19/2017 1535   RDW  13.2 12/02/2017 0846   LYMPHSABS 1.8 12/02/2017 0846   EOSABS 0.3 12/02/2017 0846   BASOSABS 0.1 12/02/2017 0846   Iron/TIBC/Ferritin/ %Sat No results found for: IRON, TIBC, FERRITIN, IRONPCTSAT Lipid Panel     Component Value Date/Time   CHOL 205 (H) 08/16/2018 1556   TRIG 67 08/16/2018 1556   HDL 86 08/16/2018 1556   CHOLHDL 2.4 08/16/2018 1556   LDLCALC 106 (H) 08/16/2018 1556   Hepatic Function Panel     Component Value Date/Time   PROT 6.5 08/16/2018 1556   ALBUMIN 4.2 08/16/2018 1556   AST 19 08/16/2018 1556   ALT 17 08/16/2018 1556   ALKPHOS 70 08/16/2018 1556   BILITOT 0.3 08/16/2018 1556      Component Value Date/Time   TSH 0.574 08/16/2018 1556   TSH 1.360 12/02/2017 0846   TSH 1.050 04/29/2017 1146     Ref. Range 03/17/2018 09:32  Vitamin D, 25-Hydroxy Latest Ref Range: 30.0 - 100.0 ng/mL 44.6    OBESITY BEHAVIORAL INTERVENTION VISIT  Today's visit was # 17   Starting weight: 190 lbs Starting date: 12/02/2017 Today's weight : 187 lbs Today's date: 11/21/2018 Total lbs lost to date: 3    11/21/2018  Height 5\' 3"  (1.6 m)  Weight 187 lb (84.8 kg)  BMI (Calculated) 33.13  BLOOD PRESSURE - SYSTOLIC [id number]  BLOOD PRESSURE - DIASTOLIC 82   Body Fat % [id number] %  Total Body Water (lbs) 73.6 lbs    ASK: We discussed the diagnosis of obesity with Jenny Reichmann today and Makeshia agreed to give [place] permission to discuss obesity behavioral modification therapy today.  ASSESS: Mikaella has the diagnosis of obesity and her BMI today is 33.13 Azariah is in the action stage of change   ADVISE: Abriella was educated on the multiple health risks of obesity as well as the  benefit of weight loss to improve her health. She was advised of the need for long term treatment and the importance of lifestyle modifications to improve her current health and to decrease her risk of future health problems.  AGREE: Multiple dietary modification options and treatment options were discussed and  Lexey agreed to follow the recommendations documented in the above note.  ARRANGE: Zuzana was educated on the importance of frequent visits to treat obesity as outlined per CMS and USPSTF guidelines and agreed to schedule her next follow up appointment today.  I, Doreene Nest, am acting as transcriptionist for [employer], FNP-C  I have reviewed the above documentation for accuracy and completeness, and I agree with the above.  -  , FNP-C.

## 2018-11-23 ENCOUNTER — Encounter (INDEPENDENT_AMBULATORY_CARE_PROVIDER_SITE_OTHER): Payer: Self-pay | Admitting: Family Medicine

## 2018-11-28 MED FILL — QVAR REDIHALER 40 MCG/ACT A: 40 | 30 days supply | Qty: 11 | Fill #6

## 2018-12-07 ENCOUNTER — Encounter: Payer: Self-pay | Admitting: Family Medicine

## 2018-12-09 ENCOUNTER — Encounter: Payer: Self-pay | Admitting: Family Medicine

## 2018-12-15 MED ORDER — CYCLOBENZAPRINE HCL 10 MG PO TABS: 5.0000 mg | ORAL_TABLET | Freq: Three times a day (TID) | ORAL | 0 refills | Status: DC | PRN

## 2018-12-15 MED FILL — CYCLOBENZAPRINE 10 MG TAB: 10 | 10 days supply | Qty: 30 | Fill #0

## 2018-12-19 ENCOUNTER — Other Ambulatory Visit: Payer: Self-pay

## 2018-12-19 ENCOUNTER — Encounter (INDEPENDENT_AMBULATORY_CARE_PROVIDER_SITE_OTHER): Payer: Self-pay | Admitting: Family Medicine

## 2018-12-19 ENCOUNTER — Ambulatory Visit (INDEPENDENT_AMBULATORY_CARE_PROVIDER_SITE_OTHER): Payer: 59 | Admitting: Family Medicine

## 2018-12-19 VITALS — BP 115/71 | HR 60 | Temp 98.6°F | Ht 63.0 in | Wt 186.0 lb

## 2018-12-19 DIAGNOSIS — E669 Obesity, unspecified: Secondary | ICD-10-CM

## 2018-12-19 DIAGNOSIS — Z6833 Body mass index (BMI) 33.0-33.9, adult: Secondary | ICD-10-CM

## 2018-12-19 DIAGNOSIS — E8881 Metabolic syndrome: Secondary | ICD-10-CM

## 2018-12-19 NOTE — Progress Notes (Signed)
Office: [phone]  /  Fax: [phone]   HPI:   Chief Complaint: OBESITY Gabrielle Castro is here to discuss her progress with her obesity treatment plan. She is on the Category 2 plan and is following her eating plan approximately 85 % of the time. She states she is walking 30 to 45 minutes 7 times per week. Gabrielle Castro has started back walking almost every day for 30 to 45 minutes. She did indulge in one meal of pancakes. Gabrielle Castro is eating all of the food on the plan and she is getting all of the protein in. Her weight is 186 lb (84.4 kg) today and has had a weight loss of 1 pound over a period of 4 weeks since her last visit. She has lost 4 lbs since starting treatment with [place].  Insulin Resistance Gabrielle Castro has a diagnosis of insulin resistance based on her elevated fasting insulin level >5. Although Gabrielle Castro's blood glucose readings are still under good control, insulin resistance puts her at greater risk of metabolic syndrome and diabetes. She is taking metformin currently and continues to work on diet and exercise to decrease risk of diabetes. Mori denies polyphagia. Lab Results  Component Value Date   HGBA1C 5.4 08/16/2018    ASSESSMENT AND PLAN:  Insulin resistance  Class 1 obesity with serious comorbidity and body mass index (BMI) of 33.0 to 33.9 in adult, unspecified obesity type  PLAN:  Insulin Resistance Gabrielle Castro will continue to work on weight loss, exercise, and decreasing simple carbohydrates in her diet to help decrease the risk of diabetes.  Gabrielle Castro will continue with the meal plan and follow up with [place] as directed to monitor her progress.  Obesity Gabrielle Castro is currently in the action stage of change. As such, her goal is to continue with weight loss efforts She has agreed to follow the Category 2 plan Gabrielle Castro will continue walking for 30 to 45 minutes, 7 times per week for weight loss and overall health benefits. We discussed the following Behavioral Modification  Strategies today: planning for success, better snacking choices, dealing with family or coworker sabotage and holiday eating strategies   Handouts for holiday recipes were given to patient today.  Gabrielle Castro has agreed to follow up with our clinic in 4 weeks. She was informed of the importance of frequent follow up visits to maximize her success with intensive lifestyle modifications for her multiple health conditions.  ALLERGIES: Allergies  Allergen Reactions  . Nickel Swelling, Hives and Itching  . Tape Rash    MEDICATIONS: Current Outpatient Medications on File Prior to Visit  Medication Sig Dispense Refill  . albuterol (VENTOLIN HFA) 108 (90 Base) MCG/ACT inhaler Inhale 2 puffs into the lungs every 4 (four) hours as needed for wheezing or shortness of breath (cough, shortness of breath or wheezing.). 1 Inhaler 0  . Ascorbic Acid (VITAMIN C) 1000 MG tablet Take 1,000 mg by mouth daily.    . beclomethasone (QVAR) 40 MCG/ACT inhaler Inhale 2 puffs into the lungs 2 (two) times daily. 10.6 g 3  . Biotin 10000 MCG TABS Take 2 tablets by mouth daily.    . Calcium 600-200 MG-UNIT tablet Take 1 tablet by mouth daily.    . cholecalciferol (VITAMIN D3) 25 MCG (1000 UT) tablet Take 2,000 Units by mouth daily.    . cyclobenzaprine (FLEXERIL) 10 MG tablet Take 0.5-1 tablets (5-10 mg total) by mouth 3 (three) times daily as needed for muscle spasms. 30 tablet 0  . Lysine 1000 MG TABS Take 1 tablet  by mouth daily.    . metFORMIN (GLUCOPHAGE) 500 MG tablet Take 1 tablet (500 mg total) by mouth daily with breakfast. 90 tablet 0  . SUPER B COMPLEX/C PO Take 2 capsules by mouth daily.     No current facility-administered medications on file prior to visit.     PAST MEDICAL HISTORY: Past Medical History:  Diagnosis Date  . Anxiety   . COPD (chronic obstructive pulmonary disease) ([place])   . Dyspnea   . EP (ectopic pregnancy)   . GERD (gastroesophageal reflux disease)     PAST SURGICAL HISTORY:  Past Surgical History:  Procedure Laterality Date  . BREAST EXCISIONAL BIOPSY Right 10+ years ago   benign  . BREAST SURGERY    . EYE SURGERY    . HAND SURGERY    . KIDNEY DONATION    . KIDNEY DONATION    . TUBAL LIGATION      SOCIAL HISTORY: Social History   Tobacco Use  . Smoking status: Former Smoker    Packs/day: 1.00    Years: 41.00    Pack years: 41.00    Types: Cigarettes    Quit date: 03/03/2016    Years since quitting: 2.7  . Smokeless tobacco: Never Used  Substance Use Topics  . Alcohol use: Yes  . Drug use: Never    FAMILY HISTORY: Family History  Problem Relation Age of Onset  . Lung cancer Father        smoked  . Cancer Father        lung; +TOB  . Cancer Paternal Grandmother        "every kind of cancer"  . Heart disease Mother   . Diabetes Mother   . Hypertension Mother   . Hyperlipidemia Mother     ROS: Review of Systems  Constitutional: Positive for weight loss.  Endo/Heme/Allergies:       Negative for polyphagia    PHYSICAL EXAM: Blood pressure 115/71, pulse 60, temperature 98.6 F (37 C), temperature source Oral, height 5\' 3"  (1.6 m), weight 186 lb (84.4 kg), SpO2 97 %. Body mass index is 32.95 kg/m. Physical Exam Vitals signs reviewed.  Constitutional:      Appearance: Normal appearance. She is well-developed. She is obese.  Cardiovascular:     Rate and Rhythm: Normal rate.  Pulmonary:     Effort: Pulmonary effort is normal.  Musculoskeletal: Normal range of motion.  Skin:    General: Skin is warm and dry.  Neurological:     Mental Status: She is alert and oriented to person, place, and time.  Psychiatric:        Mood and Affect: Mood normal.        Behavior: Behavior normal.     RECENT LABS AND TESTS: BMET    Component Value Date/Time   NA 139 08/16/2018 1556   K 4.4 08/16/2018 1556   CL 103 08/16/2018 1556   CO2 21 08/16/2018 1556   GLUCOSE 78 08/16/2018 1556   BUN 15 08/16/2018 1556   CREATININE 1.01 (H)  08/16/2018 1556   CALCIUM 9.4 08/16/2018 1556   GFRNONAA 60 08/16/2018 1556   GFRAA 69 08/16/2018 1556   Lab Results  Component Value Date   HGBA1C 5.4 08/16/2018   HGBA1C 5.5 12/02/2017   Lab Results  Component Value Date   INSULIN 8.2 12/02/2017   CBC    Component Value Date/Time   WBC 6.3 12/02/2017 0846   WBC 8.5 07/19/2017 1535   RBC 4.63 12/02/2017  0846   RBC 4.93 07/19/2017 1535   HGB 13.9 12/02/2017 0846   HCT 42.5 12/02/2017 0846   PLT 230 04/29/2017 1146   MCV 92 12/02/2017 0846   MCH 30.0 12/02/2017 0846   MCH 29.3 07/19/2017 1535   MCHC 32.7 12/02/2017 0846   MCHC 32.7 07/19/2017 1535   RDW 13.2 12/02/2017 0846   LYMPHSABS 1.8 12/02/2017 0846   EOSABS 0.3 12/02/2017 0846   BASOSABS 0.1 12/02/2017 0846   Iron/TIBC/Ferritin/ %Sat No results found for: IRON, TIBC, FERRITIN, IRONPCTSAT Lipid Panel     Component Value Date/Time   CHOL 205 (H) 08/16/2018 1556   TRIG 67 08/16/2018 1556   HDL 86 08/16/2018 1556   CHOLHDL 2.4 08/16/2018 1556   LDLCALC 106 (H) 08/16/2018 1556   Hepatic Function Panel     Component Value Date/Time   PROT 6.5 08/16/2018 1556   ALBUMIN 4.2 08/16/2018 1556   AST 19 08/16/2018 1556   ALT 17 08/16/2018 1556   ALKPHOS 70 08/16/2018 1556   BILITOT 0.3 08/16/2018 1556      Component Value Date/Time   TSH 0.574 08/16/2018 1556   TSH 1.360 12/02/2017 0846   TSH 1.050 04/29/2017 1146    Results for MAKAIYA, BARRACO "KIM RANCE" (MRN [id number]) as of 12/19/2018 19:43  Ref. Range 03/17/2018 09:32  Vitamin D, 25-Hydroxy Latest Ref Range: 30.0 - 100.0 ng/mL 44.6    OBESITY BEHAVIORAL INTERVENTION VISIT  Today's visit was # 18   Starting weight: 190 lbs Starting date: 12/02/2017 Today's weight : 186 lbs  Today's date: 12/19/2018 Total lbs lost to date: 4    12/19/2018  Height 5\' 3"  (1.6 m)  Weight 186 lb (84.4 kg)  BMI (Calculated) 32.96  BLOOD PRESSURE - SYSTOLIC [id number]  BLOOD PRESSURE - DIASTOLIC 71   Body Fat % 44 %   Total Body Water (lbs) 72.6 lbs    ASK: We discussed the diagnosis of obesity with Jenny Reichmann today and Evany agreed to give [place] permission to discuss obesity behavioral modification therapy today.  ASSESS: Tacori has the diagnosis of obesity and her BMI today is 32.96 Hanley is in the action stage of change   ADVISE: Evamarie was educated on the multiple health risks of obesity as well as the benefit of weight loss to improve her health. She was advised of the need for long term treatment and the importance of lifestyle modifications to improve her current health and to decrease her risk of future health problems.  AGREE: Multiple dietary modification options and treatment options were discussed and  Maryia agreed to follow the recommendations documented in the above note.  ARRANGE: Kestrel was educated on the importance of frequent visits to treat obesity as outlined per CMS and USPSTF guidelines and agreed to schedule her next follow up appointment today.  Corey Skains, am acting as Location manager for [employer], FNP-C.  I have reviewed the above documentation for accuracy and completeness, and I agree with the above.  - Marcille Barman, FNP-C.

## 2018-12-20 ENCOUNTER — Encounter (INDEPENDENT_AMBULATORY_CARE_PROVIDER_SITE_OTHER): Payer: Self-pay | Admitting: Family Medicine

## 2019-01-02 MED FILL — QVAR REDIHALER 40 MCG/ACT A: 40 | 30 days supply | Qty: 11 | Fill #7

## 2019-01-12 ENCOUNTER — Encounter (INDEPENDENT_AMBULATORY_CARE_PROVIDER_SITE_OTHER): Payer: Self-pay | Admitting: Family Medicine

## 2019-01-12 ENCOUNTER — Encounter: Payer: Self-pay | Admitting: Family Medicine

## 2019-01-13 ENCOUNTER — Other Ambulatory Visit: Payer: Self-pay | Admitting: Family Medicine

## 2019-01-13 DIAGNOSIS — J449 Chronic obstructive pulmonary disease, unspecified: Secondary | ICD-10-CM

## 2019-01-13 MED FILL — ALBUTEROL SULFATE HFA 108 (: 108 (90 BAS | 16 days supply | Qty: 18 | Fill #0

## 2019-01-13 NOTE — Telephone Encounter (Signed)
Requested medication (s) are due for refill today: yes Requested medication (s) are on the active medication list: yes  Last refill:  07/01/2018  Future visit scheduled: yes  Notes to clinic:  Review for refill    Requested Prescriptions  Pending Prescriptions Disp Refills   albuterol (VENTOLIN HFA) 108 (90 Base) MCG/ACT inhaler [Pharmacy Med Name: ALBUTEROL SULFATE HFA 108 ( 108 (90 BAS Aerosol] 18 g 0    Sig: Inhale 2 puffs into the lungs every 4 (four) hours as needed for wheezing or shortness of breath (cough, shortness of breath or wheezing.).     Pulmonology:  Beta Agonists Failed - 01/13/2019  9:41 AM      Failed - One inhaler should last at least one month. If the patient is requesting refills earlier, contact the patient to check for uncontrolled symptoms.      Passed - Valid encounter within last 12 months    Recent Outpatient Visits          5 months ago Annual physical exam   [facility], Lilia Argue, MD   1 year ago Acute anxiety   [facility], Lilia Argue, MD   1 year ago Acute anxiety   Primary Care at [facility], Carroll Sage, [place]   1 year ago Loose stools   Primary Care at [facility], [place] D, PA-C   1 year ago Urinary frequency   Primary Care at [facility], [place], [place]      Future Appointments            In 3 days [place], Joneen Boers, [place]   In 1 month Pamella Pert, Lilia Argue, MD Primary Care at [place], [facility]

## 2019-01-13 NOTE — Telephone Encounter (Signed)
Requested medication (s) are due for refill today:yes  Requested medication (s) are on the active medication list: yes  Last refill:  07/01/2018  Future visit scheduled: yes  Notes to clinic: review for refill   Requested Prescriptions  Pending Prescriptions Disp Refills   albuterol (VENTOLIN HFA) 108 (90 Base) MCG/ACT inhaler [Pharmacy Med Name: ALBUTEROL SULFATE HFA 108 ( 108 (90 BAS Aerosol] 18 g 0    Sig: Inhale 2 puffs into the lungs every 4 (four) hours as needed for wheezing or shortness of breath (cough, shortness of breath or wheezing.).     Pulmonology:  Beta Agonists Failed - 01/13/2019  9:41 AM      Failed - One inhaler should last at least one month. If the patient is requesting refills earlier, contact the patient to check for uncontrolled symptoms.      Passed - Valid encounter within last 12 months    Recent Outpatient Visits          5 months ago Annual physical exam   [facility], Lilia Argue, MD   1 year ago Acute anxiety   [facility], Lilia Argue, MD   1 year ago Acute anxiety   Primary Care at [facility], Carroll Sage, [place]   1 year ago Loose stools   Primary Care at [facility], [place] D, PA-C   1 year ago Urinary frequency   Primary Care at [facility], [place], [place]      Future Appointments            In 3 days [place], Joneen Boers, [place]   In 1 month Pamella Pert, Lilia Argue, MD Primary Care at [place], [facility]

## 2019-01-13 NOTE — Telephone Encounter (Signed)
Refilled medication

## 2019-01-13 NOTE — Telephone Encounter (Signed)
Pharmacy called in to make sure that this gets refilled today. Pt is recovering from [place] and needs this ASAP. Please advise.

## 2019-01-16 ENCOUNTER — Encounter (INDEPENDENT_AMBULATORY_CARE_PROVIDER_SITE_OTHER): Payer: Self-pay | Admitting: Family Medicine

## 2019-01-16 ENCOUNTER — Telehealth (INDEPENDENT_AMBULATORY_CARE_PROVIDER_SITE_OTHER): Payer: 59 | Admitting: Family Medicine

## 2019-01-16 ENCOUNTER — Other Ambulatory Visit: Payer: Self-pay

## 2019-01-16 DIAGNOSIS — Z6832 Body mass index (BMI) 32.0-32.9, adult: Secondary | ICD-10-CM

## 2019-01-16 DIAGNOSIS — J449 Chronic obstructive pulmonary disease, unspecified: Secondary | ICD-10-CM

## 2019-01-16 DIAGNOSIS — E669 Obesity, unspecified: Secondary | ICD-10-CM | POA: Diagnosis not present

## 2019-01-16 NOTE — Progress Notes (Signed)
Office: [phone]  /  Fax: [phone] TeleHealth Visit:  Gabrielle Castro has verbally consented to this TeleHealth visit today. The patient is located at home, the provider is located at the [employer] [facility] office. The participants in this visit include the listed provider and patient and any and all parties involved. The visit was conducted today via FaceTime.  HPI:   Chief Complaint: OBESITY Gabrielle Castro is here to discuss her progress with her obesity treatment plan. She is on the Category 2 plan and is following her eating plan approximately 50 % of the time. She states she is walking 30 minutes 5 times per week. Gabrielle Castro had COVID within the last few weeks. She is recovering well. She was off the plan over this time. She is not yet back on the plan. but hopes to get back on the plan today. She has not been exercising since she got sick.  We were unable to weigh the patient today for this TeleHealth visit. She feels as if she has lost weight since her last visit (weight 186 lbs, date not reported). She has lost 4 lbs since starting treatment with [place].  Chronic Obstructive Pulmonary Disease Gabrielle Castro has a diagnosis of chronic obstructive pulmonary disease and she is using albuterol daily since she was diagnosed with COVID.  ASSESSMENT AND PLAN:  Chronic obstructive pulmonary disease, unspecified COPD type ([place])  Class 1 obesity with serious comorbidity and body mass index (BMI) of 32.0 to 32.9 in adult, unspecified obesity type  PLAN:  Chronic Obstructive Pulmonary Disease Gabrielle Castro will continue all medications and follow up with our clinic in 3 weeks.  Obesity Gabrielle Castro is currently in the action stage of change. As such, her goal is to continue with weight loss efforts She has agreed to follow the Category 2 plan Gabrielle Castro will continue walking for 30 minutes, 5 times per week for weight loss and overall health benefits. We discussed the following Behavioral  Modification Strategies today: planning for success, increasing lean protein intake and decreasing simple carbohydrates   We will plan lab work in January.  Gabrielle Castro has agreed to follow up with our clinic in 3 weeks. She was informed of the importance of frequent follow up visits to maximize her success with intensive lifestyle modifications for her multiple health conditions.  ALLERGIES: Allergies  Allergen Reactions  . Nickel Swelling, Hives and Itching  . Tape Rash    MEDICATIONS: Current Outpatient Medications on File Prior to Visit  Medication Sig Dispense Refill  . albuterol (VENTOLIN HFA) 108 (90 Base) MCG/ACT inhaler INHALE 2 PUFFS INTO THE LUNGS EVERY 4 (FOUR) HOURS AS NEEDED FOR WHEEZING OR SHORTNESS OF BREATH (COUGH, SHORTNESS OF BREATH OR WHEEZING.). 18 g 0  . Ascorbic Acid (VITAMIN C) 1000 MG tablet Take 1,000 mg by mouth daily.    . beclomethasone (QVAR) 40 MCG/ACT inhaler Inhale 2 puffs into the lungs 2 (two) times daily. 10.6 g 3  . Biotin 10000 MCG TABS Take 2 tablets by mouth daily.    . Calcium 600-200 MG-UNIT tablet Take 1 tablet by mouth daily.    . cholecalciferol (VITAMIN D3) 25 MCG (1000 UT) tablet Take 2,000 Units by mouth daily.    . cyclobenzaprine (FLEXERIL) 10 MG tablet Take 0.5-1 tablets (5-10 mg total) by mouth 3 (three) times daily as needed for muscle spasms. 30 tablet 0  . Lysine 1000 MG TABS Take 1 tablet by mouth daily.    . SUPER B COMPLEX/C PO Take 2 capsules by mouth  daily.     No current facility-administered medications on file prior to visit.     PAST MEDICAL HISTORY: Past Medical History:  Diagnosis Date  . Anxiety   . COPD (chronic obstructive pulmonary disease) ([place])   . Dyspnea   . EP (ectopic pregnancy)   . GERD (gastroesophageal reflux disease)     PAST SURGICAL HISTORY: Past Surgical History:  Procedure Laterality Date  . BREAST EXCISIONAL BIOPSY Right 10+ years ago   benign  . BREAST SURGERY    . EYE SURGERY    . HAND  SURGERY    . KIDNEY DONATION    . KIDNEY DONATION    . TUBAL LIGATION      SOCIAL HISTORY: Social History   Tobacco Use  . Smoking status: Former Smoker    Packs/day: 1.00    Years: 41.00    Pack years: 41.00    Types: Cigarettes    Quit date: 03/03/2016    Years since quitting: 2.8  . Smokeless tobacco: Never Used  Substance Use Topics  . Alcohol use: Yes  . Drug use: Never    FAMILY HISTORY: Family History  Problem Relation Age of Onset  . Lung cancer Father        smoked  . Cancer Father        lung; +TOB  . Cancer Paternal Grandmother        "every kind of cancer"  . Heart disease Mother   . Diabetes Mother   . Hypertension Mother   . Hyperlipidemia Mother     ROS: Review of Systems  Constitutional: Positive for weight loss.    PHYSICAL EXAM: Pt in no acute distress  RECENT LABS AND TESTS: BMET    Component Value Date/Time   NA 139 08/16/2018 1556   K 4.4 08/16/2018 1556   CL 103 08/16/2018 1556   CO2 21 08/16/2018 1556   GLUCOSE 78 08/16/2018 1556   BUN 15 08/16/2018 1556   CREATININE 1.01 (H) 08/16/2018 1556   CALCIUM 9.4 08/16/2018 1556   GFRNONAA 60 08/16/2018 1556   GFRAA 69 08/16/2018 1556   Lab Results  Component Value Date   HGBA1C 5.4 08/16/2018   HGBA1C 5.5 12/02/2017   Lab Results  Component Value Date   INSULIN 8.2 12/02/2017   CBC    Component Value Date/Time   WBC 6.3 12/02/2017 0846   WBC 8.5 07/19/2017 1535   RBC 4.63 12/02/2017 0846   RBC 4.93 07/19/2017 1535   HGB 13.9 12/02/2017 0846   HCT 42.5 12/02/2017 0846   PLT 230 04/29/2017 1146   MCV 92 12/02/2017 0846   MCH 30.0 12/02/2017 0846   MCH 29.3 07/19/2017 1535   MCHC 32.7 12/02/2017 0846   MCHC 32.7 07/19/2017 1535   RDW 13.2 12/02/2017 0846   LYMPHSABS 1.8 12/02/2017 0846   EOSABS 0.3 12/02/2017 0846   BASOSABS 0.1 12/02/2017 0846   Iron/TIBC/Ferritin/ %Sat No results found for: IRON, TIBC, FERRITIN, IRONPCTSAT Lipid Panel     Component Value  Date/Time   CHOL 205 (H) 08/16/2018 1556   TRIG 67 08/16/2018 1556   HDL 86 08/16/2018 1556   CHOLHDL 2.4 08/16/2018 1556   LDLCALC 106 (H) 08/16/2018 1556   Hepatic Function Panel     Component Value Date/Time   PROT 6.5 08/16/2018 1556   ALBUMIN 4.2 08/16/2018 1556   AST 19 08/16/2018 1556   ALT 17 08/16/2018 1556   ALKPHOS 70 08/16/2018 1556   BILITOT 0.3 08/16/2018 1556  Component Value Date/Time   TSH 0.574 08/16/2018 1556   TSH 1.360 12/02/2017 0846   TSH 1.050 04/29/2017 1146     Ref. Range 03/17/2018 09:32  Vitamin D, 25-Hydroxy Latest Ref Range: 30.0 - 100.0 ng/mL 44.6    I, Doreene Nest, am acting as Location manager for [employer], FNP-C  I have reviewed the above documentation for accuracy and completeness, and I agree with the above.  - Jacai Kipp, FNP-C.

## 2019-01-23 IMAGING — DX DG HUMERUS 2V *R*
2 series · 2 of 2 positions shown · non-contrast
Comparison: None.

CLINICAL DATA: 60-year-old female with right arm pain

EXAM:
RIGHT HUMERUS - 2+ VIEW

[humerus ap]
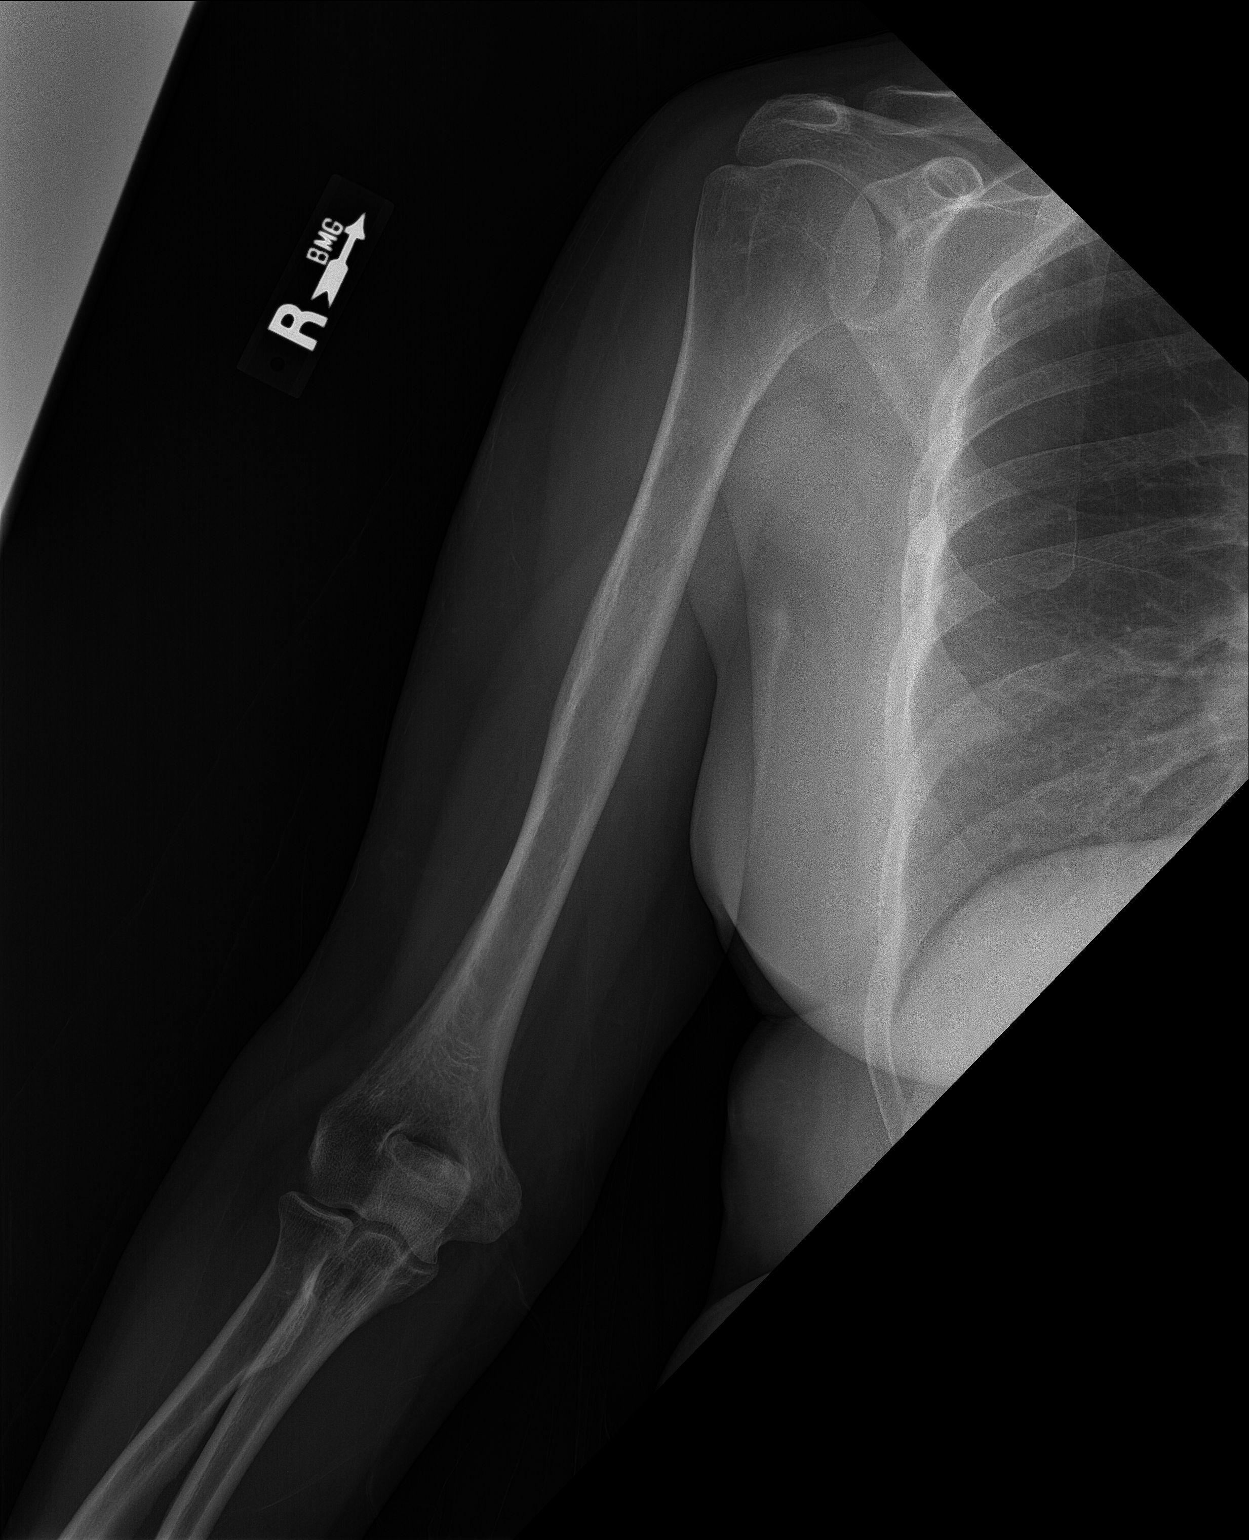

[humerus lat]
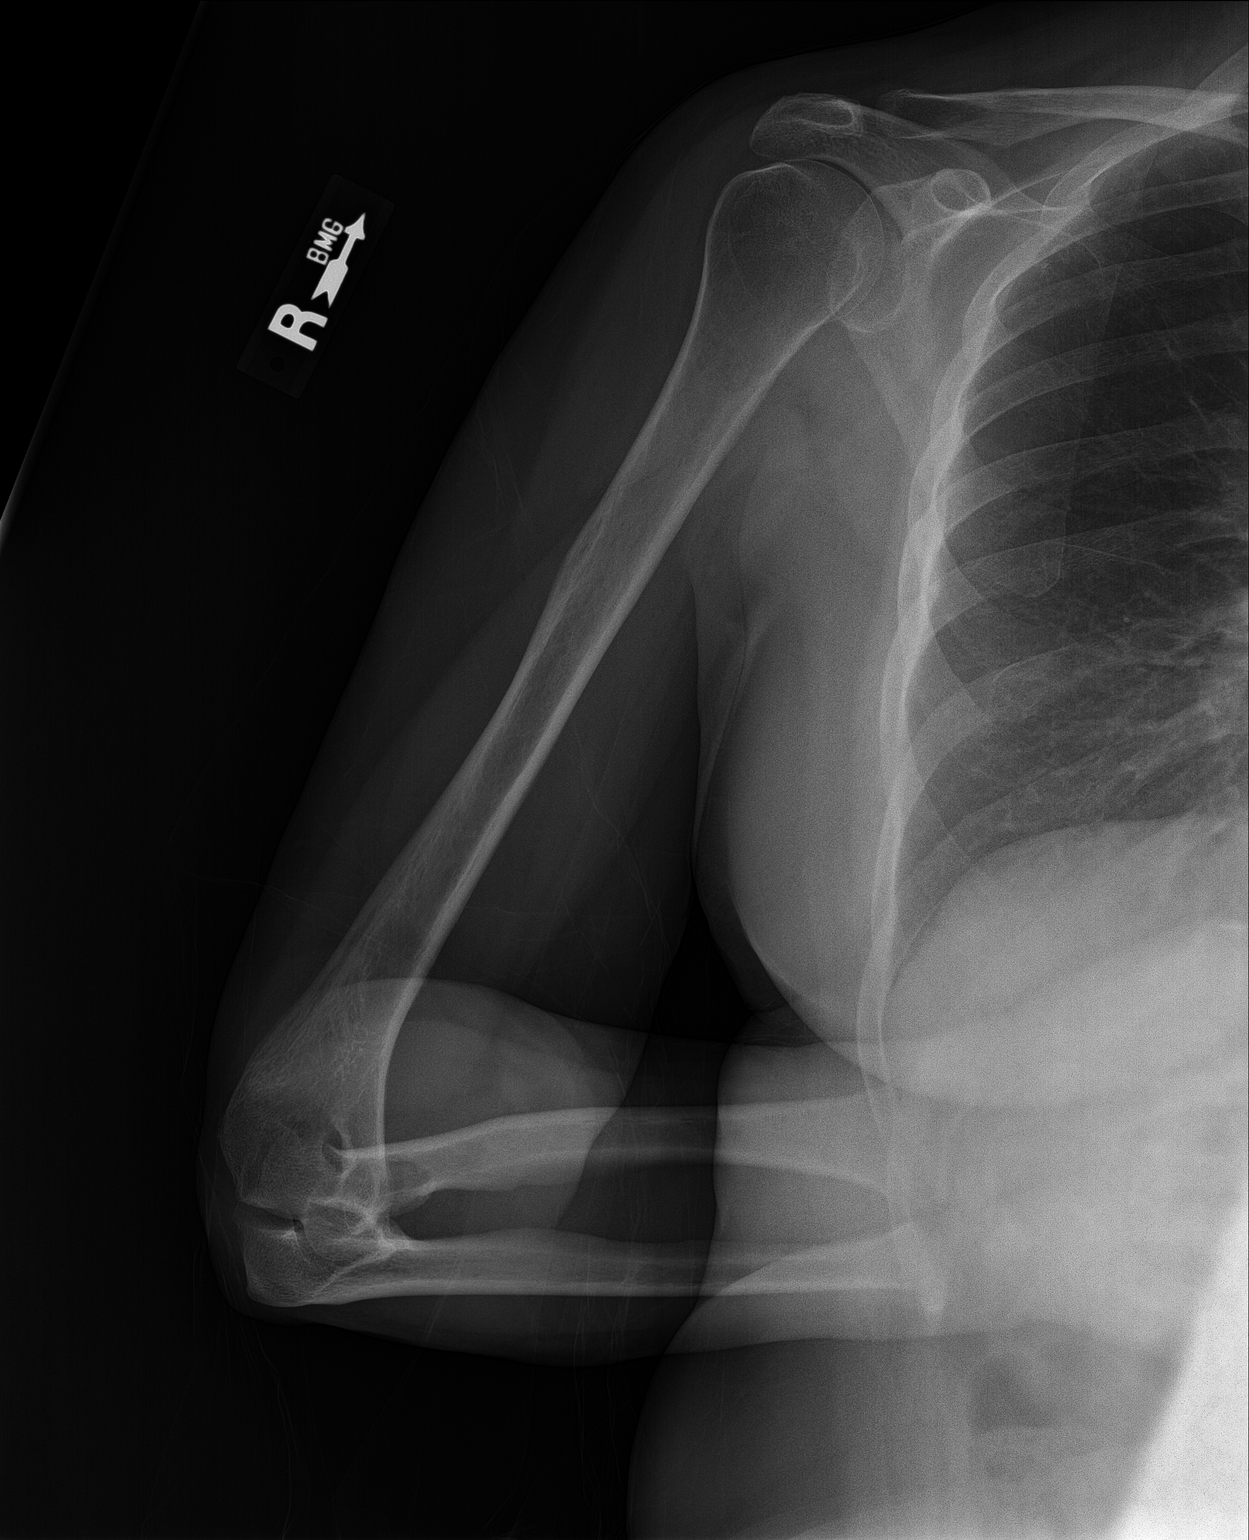

[2 of 2 positions shown; findings below may reference images not displayed]

FINDINGS: There is no evidence of fracture or other focal bone lesions. Soft
tissues are unremarkable.
IMPRESSION: Negative.

## 2019-01-23 IMAGING — DX DG SHOULDER 2+V*R*
3 series · 3 of 3 positions shown · non-contrast
Comparison: None.

CLINICAL DATA: Right shoulder pain with movement.

EXAM:
RIGHT SHOULDER - 2+ VIEW

[shoulder ap]
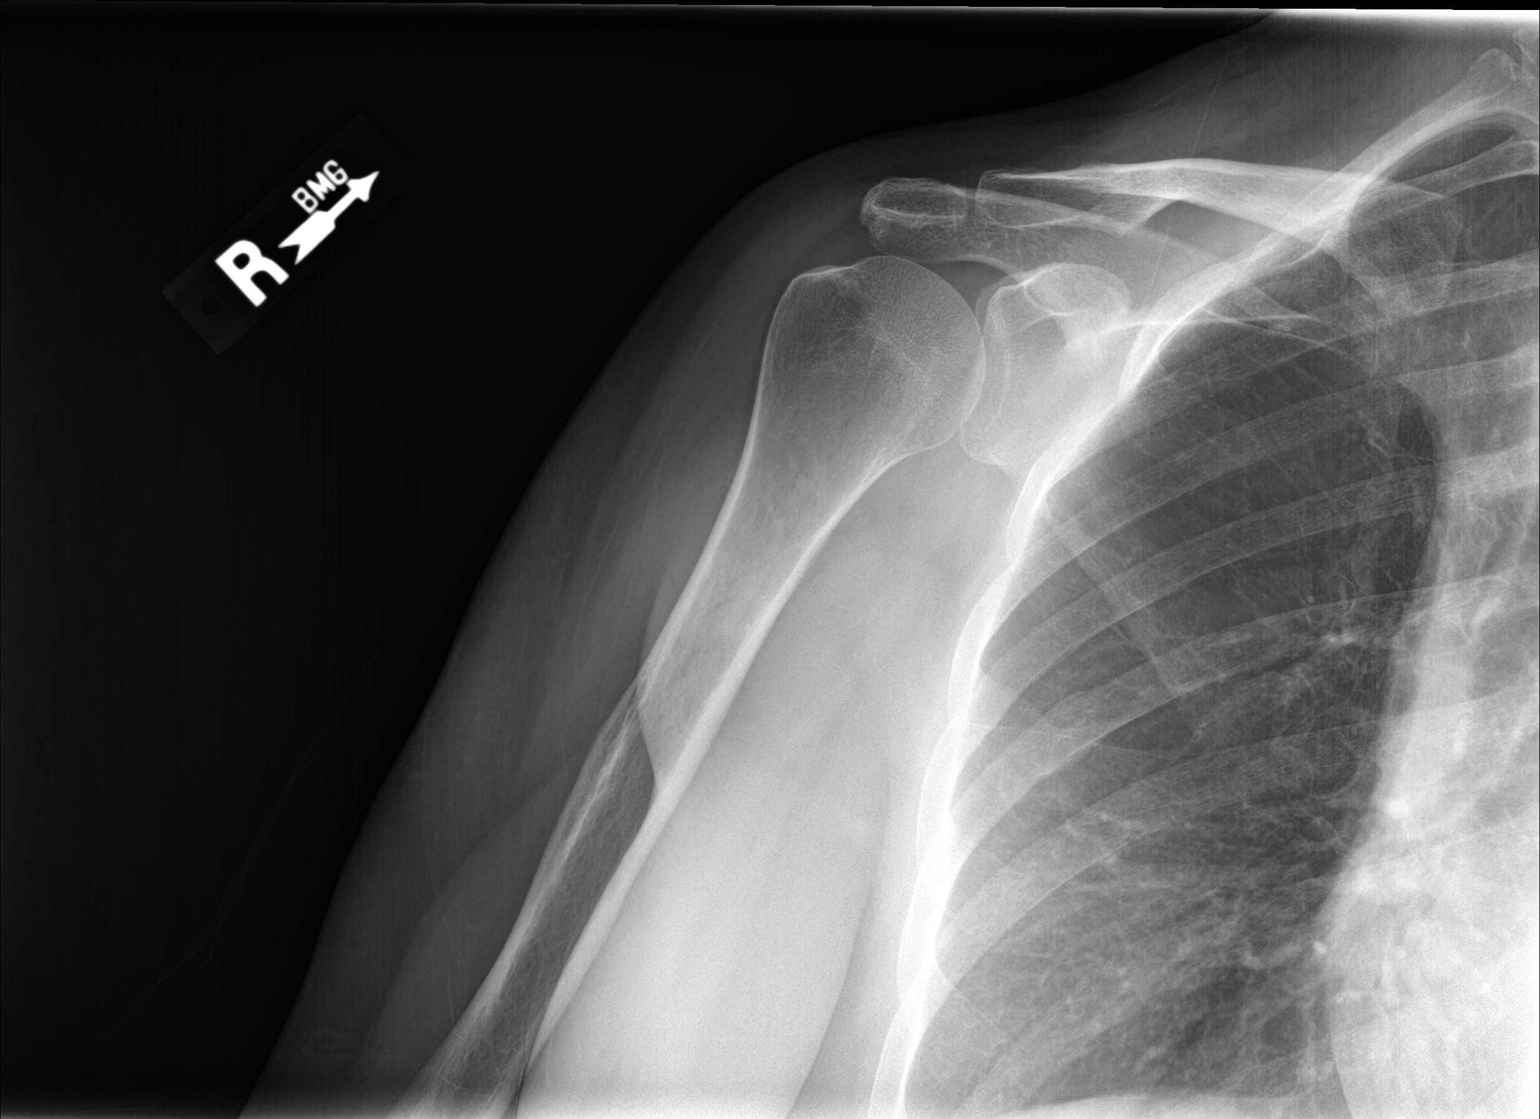

[shoulder y-view]
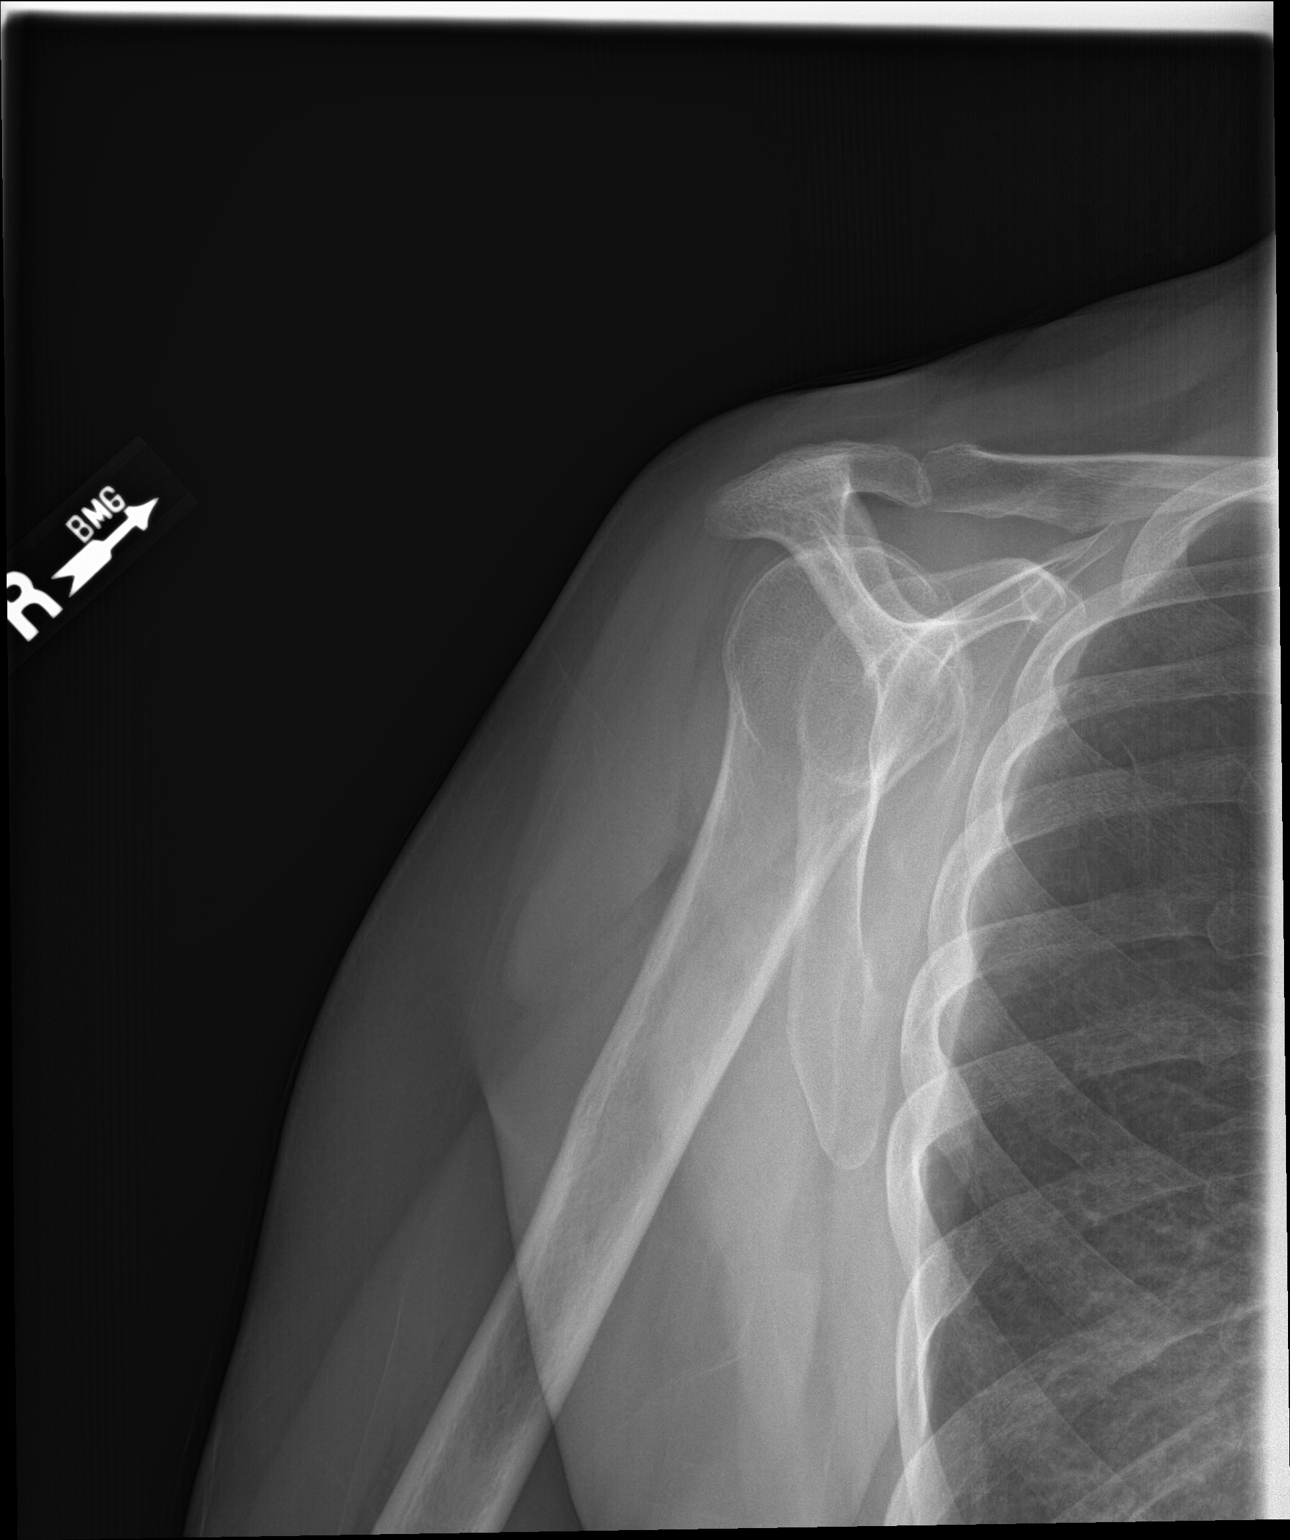

[shoulder axial]
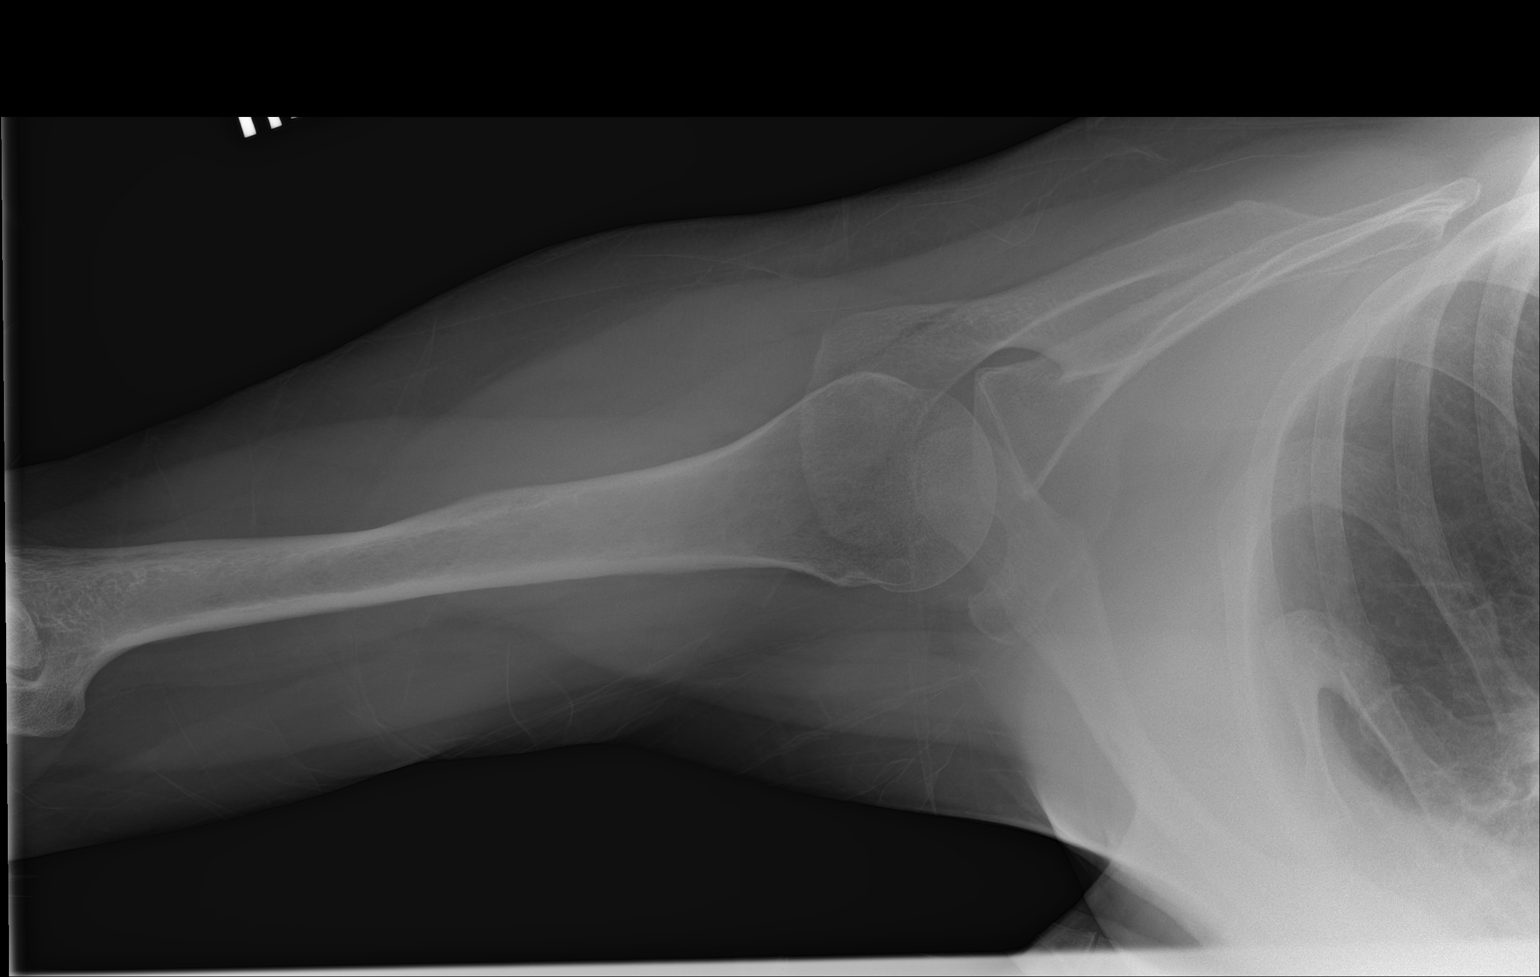

[3 of 3 positions shown; findings below may reference images not displayed]

FINDINGS: There is no evidence of fracture or dislocation. There is no
evidence of arthropathy or other focal bone abnormality. Soft
tissues are unremarkable.
IMPRESSION: Negative. Consider MRI if there is concern for rotator cuff
abnormality.

## 2019-01-27 MED FILL — QVAR REDIHALER 40 MCG/ACT A: 40 | 30 days supply | Qty: 11 | Fill #8

## 2019-02-13 ENCOUNTER — Ambulatory Visit: Payer: 59 | Admitting: Family Medicine

## 2019-02-13 ENCOUNTER — Encounter: Payer: Self-pay | Admitting: Family Medicine

## 2019-02-13 ENCOUNTER — Other Ambulatory Visit: Payer: Self-pay

## 2019-02-13 VITALS — BP 127/84 | HR 69 | Temp 97.4°F | Wt 191.6 lb

## 2019-02-13 DIAGNOSIS — R7989 Other specified abnormal findings of blood chemistry: Secondary | ICD-10-CM | POA: Diagnosis not present

## 2019-02-13 DIAGNOSIS — E7849 Other hyperlipidemia: Secondary | ICD-10-CM

## 2019-02-13 DIAGNOSIS — E8881 Metabolic syndrome: Secondary | ICD-10-CM | POA: Diagnosis not present

## 2019-02-13 DIAGNOSIS — E559 Vitamin D deficiency, unspecified: Secondary | ICD-10-CM | POA: Diagnosis not present

## 2019-02-13 DIAGNOSIS — M545 Low back pain, unspecified: Secondary | ICD-10-CM

## 2019-02-13 NOTE — Patient Instructions (Addendum)
If you have lab work done today you will be contacted with your lab results within the next 2 weeks.  If you have not heard from [place] then please contact us. The fastest way to get your results is to register for My Chart.   IF you received an x-ray today, you will receive an invoice from [facility]. Please contact [facility] at [phone] with questions or concerns regarding your invoice.   IF you received labwork today, you will receive an invoice from [facility]. Please contact [facility] at [phone] with questions or concerns regarding your invoice.   Our billing staff will not be able to assist you with questions regarding bills from these companies.  You will be contacted with the lab results as soon as they are available. The fastest way to get your results is to activate your My Chart account. Instructions are located on the last page of this paperwork. If you have not heard from [place] regarding the results in 2 weeks, please contact this office.      Low Back Sprain or Strain Rehab Ask your health care provider which exercises are safe for you. Do exercises exactly as told by your health care provider and adjust them as directed. It is normal to feel mild stretching, pulling, tightness, or discomfort as you do these exercises. Stop right away if you feel sudden pain or your pain gets worse. Do not begin these exercises until told by your health care provider. Stretching and range-of-motion exercises These exercises warm up your muscles and joints and improve the movement and flexibility of your back. These exercises also help to relieve pain, numbness, and tingling. Lumbar rotation  1. Lie on your back on a firm surface and bend your knees. 2. Straighten your arms out to your sides so each arm forms a 90-degree angle (right angle) with a side of your body. 3. Slowly move (rotate) both of your knees to one side of your body until you feel a stretch in your lower  back (lumbar). Try not to let your shoulders lift off the floor. 4. Hold this position for __________ seconds. 5. Tense your abdominal muscles and slowly move your knees back to the starting position. 6. Repeat this exercise on the other side of your body. Repeat __________ times. Complete this exercise __________ times a day. Single knee to chest  1. Lie on your back on a firm surface with both legs straight. 2. Bend one of your knees. Use your hands to move your knee up toward your chest until you feel a gentle stretch in your lower back and buttock. ? Hold your leg in this position by holding on to the front of your knee. ? Keep your other leg as straight as possible. 3. Hold this position for __________ seconds. 4. Slowly return to the starting position. 5. Repeat with your other leg. Repeat __________ times. Complete this exercise __________ times a day. Prone extension on elbows  1. Lie on your abdomen on a firm surface (prone position). 2. Prop yourself up on your elbows. 3. Use your arms to help lift your chest up until you feel a gentle stretch in your abdomen and your lower back. ? This will place some of your body weight on your elbows. If this is uncomfortable, try stacking pillows under your chest. ? Your hips should stay down, against the surface that you are lying on. Keep your hip and back muscles relaxed. 4. Hold this position for  __________ seconds. 5. Slowly relax your upper body and return to the starting position. Repeat __________ times. Complete this exercise __________ times a day. Strengthening exercises These exercises build strength and endurance in your back. Endurance is the ability to use your muscles for a long time, even after they get tired. Pelvic tilt This exercise strengthens the muscles that lie deep in the abdomen. 1. Lie on your back on a firm surface. Bend your knees and keep your feet flat on the floor. 2. Tense your abdominal muscles. Tip your  pelvis up toward the ceiling and flatten your lower back into the floor. ? To help with this exercise, you may place a small towel under your lower back and try to push your back into the towel. 3. Hold this position for __________ seconds. 4. Let your muscles relax completely before you repeat this exercise. Repeat __________ times. Complete this exercise __________ times a day. Alternating arm and leg raises  1. Get on your hands and knees on a firm surface. If you are on a hard floor, you may want to use padding, such as an exercise mat, to cushion your knees. 2. Line up your arms and legs. Your hands should be directly below your shoulders, and your knees should be directly below your hips. 3. Lift your left leg behind you. At the same time, raise your right arm and straighten it in front of you. ? Do not lift your leg higher than your hip. ? Do not lift your arm higher than your shoulder. ? Keep your abdominal and back muscles tight. ? Keep your hips facing the ground. ? Do not arch your back. ? Keep your balance carefully, and do not hold your breath. 4. Hold this position for __________ seconds. 5. Slowly return to the starting position. 6. Repeat with your right leg and your left arm. Repeat __________ times. Complete this exercise __________ times a day. Abdominal set with straight leg raise  1. Lie on your back on a firm surface. 2. Bend one of your knees and keep your other leg straight. 3. Tense your abdominal muscles and lift your straight leg up, 4-6 inches (10-15 cm) off the ground. 4. Keep your abdominal muscles tight and hold this position for __________ seconds. ? Do not hold your breath. ? Do not arch your back. Keep it flat against the ground. 5. Keep your abdominal muscles tense as you slowly lower your leg back to the starting position. 6. Repeat with your other leg. Repeat __________ times. Complete this exercise __________ times a day. Single leg lower with bent  knees 1. Lie on your back on a firm surface. 2. Tense your abdominal muscles and lift your feet off the floor, one foot at a time, so your knees and hips are bent in 90-degree angles (right angles). ? Your knees should be over your hips and your lower legs should be parallel to the floor. 3. Keeping your abdominal muscles tense and your knee bent, slowly lower one of your legs so your toe touches the ground. 4. Lift your leg back up to return to the starting position. ? Do not hold your breath. ? Do not let your back arch. Keep your back flat against the ground. 5. Repeat with your other leg. Repeat __________ times. Complete this exercise __________ times a day. Posture and body mechanics Good posture and healthy body mechanics can help to relieve stress in your body's tissues and joints. Body mechanics refers to the movements  and positions of your body while you do your daily activities. Posture is part of body mechanics. Good posture means:  Your spine is in its natural S-curve position (neutral).  Your shoulders are pulled back slightly.  Your head is not tipped forward. Follow these guidelines to improve your posture and body mechanics in your everyday activities. Standing   When standing, keep your spine neutral and your feet about hip width apart. Keep a slight bend in your knees. Your ears, shoulders, and hips should line up.  When you do a task in which you stand in one place for a long time, place one foot up on a stable object that is 2-4 inches (5-10 cm) high, such as a footstool. This helps keep your spine neutral. Sitting   When sitting, keep your spine neutral and keep your feet flat on the floor. Use a footrest, if necessary, and keep your thighs parallel to the floor. Avoid rounding your shoulders, and avoid tilting your head forward.  When working at a desk or a computer, keep your desk at a height where your hands are slightly lower than your elbows. Slide your chair  under your desk so you are close enough to maintain good posture.  When working at a computer, place your monitor at a height where you are looking straight ahead and you do not have to tilt your head forward or downward to look at the screen. Resting  When lying down and resting, avoid positions that are most painful for you.  If you have pain with activities such as sitting, bending, stooping, or squatting, lie in a position in which your body does not bend very much. For example, avoid curling up on your side with your arms and knees near your chest (fetal position).  If you have pain with activities such as standing for a long time or reaching with your arms, lie with your spine in a neutral position and bend your knees slightly. Try the following positions: ? Lying on your side with a pillow between your knees. ? Lying on your back with a pillow under your knees. Lifting   When lifting objects, keep your feet at least shoulder width apart and tighten your abdominal muscles.  Bend your knees and hips and keep your spine neutral. It is important to lift using the strength of your legs, not your back. Do not lock your knees straight out.  Always ask for help to lift heavy or awkward objects. This information is not intended to replace advice given to you by your health care provider. Make sure you discuss any questions you have with your health care provider. Document Revised: 05/20/2018 Document Reviewed: 02/17/2018 Elsevier Patient Education  [place].

## 2019-02-13 NOTE — Progress Notes (Signed)
1/4/20213:33 PM  Gabrielle Castro 04/26/1955, 64 y.o., female [id number]  Chief Complaint  Patient presents with  . Medical Management of Chronic Issues    6 month f\u    HPI:   Patient is a 64 y.o. female with past medical history significant for HLP, Single kidney, prediabetes, COPD, vitamin D deficiency who presents for routine followup  Last OV July for CPE Going to [facility]  She did go to obgyn for recurring area of bleeding obgyn had no concerns Started her on vaginal estrogen and Desitin as needed  Otherwise she doing well She recovered well from covid, diagnosed around [place] on getting back to walking now that she is feeling better Uses albuterol prn  Taking vitamin D 2000 units a day  Occasionally has left sided low back pain Sometimes radiates down into her thigh No numbness or tingling No changes to bowel or bladder Mostly after prolonged cleaning sprees or doing dishes in low sink   Wt Readings from Last 3 Encounters:  02/13/19 191 lb 9.6 oz (86.9 kg)  12/19/18 186 lb (84.4 kg)  11/21/18 187 lb (84.8 kg)    Lab Results  Component Value Date   HGBA1C 5.4 08/16/2018   HGBA1C 5.5 12/02/2017   Lab Results  Component Value Date   LDLCALC 106 (H) 08/16/2018   CREATININE 1.01 (H) 08/16/2018    Depression screen PHQ 2/9 02/13/2019 08/16/2018 12/02/2017  Decreased Interest 0 0 2  Down, Depressed, Hopeless 0 0 1  PHQ - 2 Score 0 0 3  Altered sleeping - - 1  Tired, decreased energy - - 3  Change in appetite - - 1  Feeling bad or failure about yourself  - - 2  Trouble concentrating - - 1  Moving slowly or fidgety/restless - - 2  Suicidal thoughts - - 0  PHQ-9 Score - - 13  Difficult doing work/chores - - Somewhat difficult    Fall Risk  02/13/2019 08/16/2018 11/16/2017 07/19/2017 06/17/2017  Falls in the past year? 1 0 No No No  Comment blacked out 01/04/19 - - - -  Number falls in past yr: 0 0 - - -  Injury with Fall? 0 0 - - -  Risk for fall  due to : Other (Comment) - - - -  Follow up Falls evaluation completed - - - -     Allergies  Allergen Reactions  . Nickel Swelling, Hives and Itching  . Tape Rash    Prior to Admission medications   Medication Sig Start Date End Date Taking? Authorizing Provider  albuterol (VENTOLIN HFA) 108 (90 Base) MCG/ACT inhaler INHALE 2 PUFFS INTO THE LUNGS EVERY 4 (FOUR) HOURS AS NEEDED FOR WHEEZING OR SHORTNESS OF BREATH (COUGH, SHORTNESS OF BREATH OR WHEEZING.). 01/13/19  Yes Rutherford Guys, MD  Ascorbic Acid (VITAMIN C) 1000 MG tablet Take 1,000 mg by mouth daily.   Yes [provider]  beclomethasone (QVAR) 40 MCG/ACT inhaler Inhale 2 puffs into the lungs 2 (two) times daily. 07/04/18  Yes Rutherford Guys, MD  Biotin 10000 MCG TABS Take 2 tablets by mouth daily.   Yes [provider]  Calcium 600-200 MG-UNIT tablet Take 1 tablet by mouth daily.   Yes [provider]  cholecalciferol (VITAMIN D3) 25 MCG (1000 UT) tablet Take 2,000 Units by mouth daily.   Yes [provider]  cyclobenzaprine (FLEXERIL) 10 MG tablet Take 0.5-1 tablets (5-10 mg total) by mouth 3 (three) times daily as needed for  muscle spasms. 12/15/18  Yes Rutherford Guys, MD  Lysine 1000 MG TABS Take 1 tablet by mouth daily.   Yes [provider]  SUPER B COMPLEX/C PO Take 2 capsules by mouth daily.   Yes [provider]    Past Medical History:  Diagnosis Date  . Anxiety   . COPD (chronic obstructive pulmonary disease) ([place])   . Dyspnea   . EP (ectopic pregnancy)   . GERD (gastroesophageal reflux disease)     Past Surgical History:  Procedure Laterality Date  . BREAST EXCISIONAL BIOPSY Right 10+ years ago   benign  . BREAST SURGERY    . EYE SURGERY    . HAND SURGERY    . KIDNEY DONATION    . KIDNEY DONATION    . TUBAL LIGATION      Social History   Tobacco Use  . Smoking status: Former Smoker    Packs/day: 1.00    Years: 41.00    Pack years: 41.00     Types: Cigarettes    Quit date: 03/03/2016    Years since quitting: 2.9  . Smokeless tobacco: Never Used  Substance Use Topics  . Alcohol use: Yes    Family History  Problem Relation Age of Onset  . Lung cancer Father        smoked  . Cancer Father        lung; +TOB  . Cancer Paternal Grandmother        "every kind of cancer"  . Heart disease Mother   . Diabetes Mother   . Hypertension Mother   . Hyperlipidemia Mother     Review of Systems  Constitutional: Negative for chills and fever.  Respiratory: Negative for cough and shortness of breath.   Cardiovascular: Negative for chest pain, palpitations and leg swelling.  Gastrointestinal: Negative for abdominal pain, nausea and vomiting.  per hpi   OBJECTIVE:  Today's Vitals   02/13/19 1525  BP: 127/84  Pulse: 69  Temp: (!) 97.4 F (36.3 C)  TempSrc: Temporal  SpO2: 97%  Weight: 191 lb 9.6 oz (86.9 kg)   Body mass index is 33.94 kg/m.   Physical Exam Vitals and nursing note reviewed.  Constitutional:      Appearance: She is well-developed.  HENT:     Head: Normocephalic and atraumatic.     Mouth/Throat:     Pharynx: No oropharyngeal exudate.  Eyes:     General: No scleral icterus.    Conjunctiva/sclera: Conjunctivae normal.     Pupils: Pupils are equal, round, and reactive to light.  Cardiovascular:     Rate and Rhythm: Normal rate and regular rhythm.     Heart sounds: Normal heart sounds. No murmur. No friction rub. No gallop.   Pulmonary:     Effort: Pulmonary effort is normal.     Breath sounds: Normal breath sounds. No wheezing or rales.  Musculoskeletal:     Cervical back: Neck supple.     Lumbar back: No spasms, tenderness or bony tenderness. Normal range of motion. Negative right straight leg raise test and negative left straight leg raise test.  Skin:    General: Skin is warm and dry.  Neurological:     Mental Status: She is alert and oriented to person, place, and time.     Gait: Gait is  intact.     Deep Tendon Reflexes: Reflexes are normal and symmetric.     No results found for this or any previous visit (from the  past 24 hour(s)).  No results found.   ASSESSMENT and PLAN  1. Insulin resistance Checking labs today, cont LFM - Hemoglobin A1c  2. Other hyperlipidemia Checking labs today, medications will be adjusted as needed. Cont LFM - Lipid panel  3. Vitamin D deficiency Checking labs today, medications will be adjusted as needed.  - Vitamin D, 25-hydroxy  4. Elevated serum creatinine - Comprehensive metabolic panel  5. Acute left-sided low back pain without sciatica Discussed conservative measures, patient handout given.  Return in about 6 months (around 08/13/2019).    Rutherford Guys, MD [facility] [place], [place] [address] Ph.  [phone] Fax [phone]

## 2019-02-14 ENCOUNTER — Telehealth (INDEPENDENT_AMBULATORY_CARE_PROVIDER_SITE_OTHER): Payer: 59 | Admitting: Family Medicine

## 2019-02-14 DIAGNOSIS — E66811 Obesity, class 1: Secondary | ICD-10-CM

## 2019-02-14 DIAGNOSIS — R944 Abnormal results of kidney function studies: Secondary | ICD-10-CM | POA: Diagnosis not present

## 2019-02-14 DIAGNOSIS — E669 Obesity, unspecified: Secondary | ICD-10-CM

## 2019-02-14 DIAGNOSIS — Z6832 Body mass index (BMI) 32.0-32.9, adult: Secondary | ICD-10-CM

## 2019-02-14 LAB — COMPREHENSIVE METABOLIC PANEL
ALT: 15 IU/L (ref 0–32)
AST: 18 IU/L (ref 0–40)
Albumin/Globulin Ratio: 1.5 (ref 1.2–2.2)
Albumin: 4.1 g/dL (ref 3.8–4.8)
Alkaline Phosphatase: 70 IU/L (ref 39–117)
BUN/Creatinine Ratio: 21 (ref 12–28)
BUN: 24 mg/dL (ref 8–27)
Bilirubin Total: 0.2 mg/dL (ref 0.0–1.2)
CO2: 23 mmol/L (ref 20–29)
Calcium: 9.8 mg/dL (ref 8.7–10.3)
Chloride: 101 mmol/L (ref 96–106)
Creatinine, Ser: 1.16 mg/dL — ABNORMAL HIGH (ref 0.57–1.00)
GFR calc Af Amer: 58 mL/min/{1.73_m2} — ABNORMAL LOW (ref >59)
GFR calc non Af Amer: 51 mL/min/{1.73_m2} — ABNORMAL LOW (ref >59)
Globulin, Total: 2.7 g/dL (ref 1.5–4.5)
Glucose: 84 mg/dL (ref 65–99)
Potassium: 4.7 mmol/L (ref 3.5–5.2)
Sodium: 139 mmol/L (ref 134–144)
Total Protein: 6.8 g/dL (ref 6.0–8.5)

## 2019-02-14 LAB — LIPID PANEL
Chol/HDL Ratio: 2.9 ratio (ref 0.0–4.4)
Cholesterol, Total: 221 mg/dL — ABNORMAL HIGH (ref 100–199)
HDL: 76 mg/dL (ref >39)
LDL Chol Calc (NIH): 114 mg/dL — ABNORMAL HIGH (ref 0–99)
Triglycerides: 180 mg/dL — ABNORMAL HIGH (ref 0–149)
VLDL Cholesterol Cal: 31 mg/dL (ref 5–40)

## 2019-02-14 LAB — VITAMIN D 25 HYDROXY (VIT D DEFICIENCY, FRACTURES): Vit D, 25-Hydroxy: 48.7 ng/mL (ref 30.0–100.0)

## 2019-02-14 LAB — HEMOGLOBIN A1C
Est. average glucose Bld gHb Est-mCnc: 111 mg/dL
Hgb A1c MFr Bld: 5.5 % (ref 4.8–5.6)

## 2019-02-15 ENCOUNTER — Encounter: Payer: Self-pay | Admitting: Adult Health Nurse Practitioner

## 2019-02-15 ENCOUNTER — Telehealth (INDEPENDENT_AMBULATORY_CARE_PROVIDER_SITE_OTHER): Payer: 59 | Admitting: Adult Health Nurse Practitioner

## 2019-02-15 ENCOUNTER — Other Ambulatory Visit: Payer: Self-pay

## 2019-02-15 DIAGNOSIS — J029 Acute pharyngitis, unspecified: Secondary | ICD-10-CM

## 2019-02-15 HISTORY — DX: Acute pharyngitis, unspecified: J02.9

## 2019-02-15 MED ORDER — AMOXICILLIN 875 MG PO TABS: 875.0000 mg | ORAL_TABLET | Freq: Two times a day (BID) | ORAL | 0 refills | Status: DC

## 2019-02-15 MED ORDER — CHLORHEXIDINE GLUCONATE 0.12 % MT SOLN: 15.0000 mL | Freq: Two times a day (BID) | OROMUCOSAL | 0 refills | Status: DC

## 2019-02-15 NOTE — Progress Notes (Signed)
Telemedicine Encounter- SOAP NOTE Established Patient  This telephone encounter was conducted with the patient's (or proxy's) verbal consent via audio telecommunications: yes/no: Yes Patient was instructed to have this encounter in a suitably private space; and to only have persons present to whom they give permission to participate. In addition, patient identity was confirmed by use of name plus two identifiers (DOB and address).  I discussed the limitations, risks, security and privacy concerns of performing an evaluation and management service by telephone and the availability of in person appointments. I also discussed with the patient that there may be a patient responsible charge related to this service. The patient expressed understanding and agreed to proceed.  I spent a total of TIME; 0 MIN TO 60 MIN: 10 minutes talking with the patient or their proxy.  Chief Complaint  Patient presents with  . Sore Throat    Blisters on her throat and the roof of her mouth is sore.     Subjective   Gabrielle Castro is a 64 y.o. established patient. Telephone visit today for  HPI   + Covid 01/06/19:    Sore throat with swallowing.  Negative fever.  +Chills, No known exposure to strep   Patient Active Problem List   Diagnosis Date Noted  . Other hyperlipidemia 03/21/2018  . Class 1 obesity with serious comorbidity and body mass index (BMI) of 32.0 to 32.9 in adult 02/16/2018  . Vitamin D deficiency 01/13/2018  . Insulin resistance 01/13/2018  . BMI 32.0-32.9,adult 08/07/2016  . Single kidney 07/03/2016  . Elevated serum creatinine 07/02/2016  . History of abnormal cervical Pap smear 06/30/2016  . COPD GOLD II/III  03/25/2016    Past Medical History:  Diagnosis Date  . Anxiety   . COPD (chronic obstructive pulmonary disease) ([place])   . Dyspnea   . EP (ectopic pregnancy)   . GERD (gastroesophageal reflux disease)     Current Outpatient Medications  Medication Sig Dispense  Refill  . albuterol (VENTOLIN HFA) 108 (90 Base) MCG/ACT inhaler INHALE 2 PUFFS INTO THE LUNGS EVERY 4 (FOUR) HOURS AS NEEDED FOR WHEEZING OR SHORTNESS OF BREATH (COUGH, SHORTNESS OF BREATH OR WHEEZING.). 18 g 0  . Ascorbic Acid (VITAMIN C) 1000 MG tablet Take 1,000 mg by mouth daily.    . beclomethasone (QVAR) 40 MCG/ACT inhaler Inhale 2 puffs into the lungs 2 (two) times daily. 10.6 g 3  . Biotin 10000 MCG TABS Take 2 tablets by mouth daily.    . Calcium 600-200 MG-UNIT tablet Take 1 tablet by mouth daily.    . cholecalciferol (VITAMIN D3) 25 MCG (1000 UT) tablet Take 2,000 Units by mouth daily.    . cyclobenzaprine (FLEXERIL) 10 MG tablet Take 0.5-1 tablets (5-10 mg total) by mouth 3 (three) times daily as needed for muscle spasms. 30 tablet 0  . Lysine 1000 MG TABS Take 1 tablet by mouth daily. Pt stated that she is only taken 500mg .    . SUPER B COMPLEX/C PO Take 2 capsules by mouth daily.     No current facility-administered medications for this visit.    Allergies  Allergen Reactions  . Nickel Swelling, Hives and Itching  . Tape Rash    Social History   Socioeconomic History  . Marital status: Married    Spouse name: Grant Mazzola  . Number of children: 4  . Years of education: Not on file  . Highest education level: Associate degree: academic program  Occupational History  . Occupation: Equities trader  payable    Employer: [employer]  Tobacco Use  . Smoking status: Former Smoker    Packs/day: 1.00    Years: 41.00    Pack years: 41.00    Types: Cigarettes    Quit date: 03/03/2016    Years since quitting: 2.9  . Smokeless tobacco: Never Used  Substance and Sexual Activity  . Alcohol use: Yes  . Drug use: Never  . Sexual activity: Not Currently    Comment: since husband's stroke in 2014  Other Topics Concern  . Not on file  Social History Narrative   Lives with her husband, who is disabled due to multiple cardiovascular diagnoses, and her elderly mother.   Her mother is a  "pack rat." Her husband doesn't like clutter, and makes snide comments about it.   One daughter lives ne[place].   Other 2 daughters and son live near [place], [place].    Social Determinants of Health   Financial Resource Strain:   . Difficulty of Paying Living Expenses: Not on file  Food Insecurity:   . Worried About Charity fundraiser in the Last Year: Not on file  . Ran Out of Food in the Last Year: Not on file  Transportation Needs:   . Lack of Transportation (Medical): Not on file  . Lack of Transportation (Non-Medical): Not on file  Physical Activity:   . Days of Exercise per Week: Not on file  . Minutes of Exercise per Session: Not on file  Stress:   . Feeling of Stress : Not on file  Social Connections:   . Frequency of Communication with Friends and Family: Not on file  . Frequency of Social Gatherings with Friends and Family: Not on file  . Attends Religious Services: Not on file  . Active Member of Clubs or Organizations: Not on file  . Attends Archivist Meetings: Not on file  . Marital Status: Not on file  Intimate Partner Violence:   . Fear of Current or Ex-Partner: Not on file  . Emotionally Abused: Not on file  . Physically Abused: Not on file  . Sexually Abused: Not on file    ROS  Objective   Vitals as reported by the patient: 1. Sore throat    Meds ordered this encounter  Medications  . DISCONTD: amoxicillin (AMOXIL) 875 MG tablet    Sig: Take 1 tablet (875 mg total) by mouth 2 (two) times daily.    Dispense:  20 tablet    Refill:  0  . DISCONTD: chlorhexidine (PERIDEX) 0.12 % solution    Sig: Use as directed 15 mLs in the mouth or throat 2 (two) times daily.    Dispense:  120 mL    Refill:  0  . DISCONTD: chlorhexidine (PERIDEX) 0.12 % solution    Sig: Use as directed 15 mLs in the mouth or throat 2 (two) times daily.    Dispense:  120 mL    Refill:  0  . DISCONTD: amoxicillin (AMOXIL) 875 MG tablet    Sig: Take 1 tablet (875 mg  total) by mouth 2 (two) times daily.    Dispense:  20 tablet    Refill:  0    I discussed the assessment and treatment plan with the patient. The patient was provided an opportunity to ask questions and all were answered. The patient agreed with the plan and demonstrated an understanding of the instructions.   The patient was advised to call back or seek an in-person evaluation  if the symptoms worsen or if the condition fails to improve as anticipated.  I provided 7 minutes of non-face-to-face time during this encounter.  Glyn Ade, NP  Primary Care at [facility]

## 2019-02-15 NOTE — Patient Instructions (Signed)
° ° ° °  If you have lab work done today you will be contacted with your lab results within the next 2 weeks.  If you have not heard from us then please contact us. The fastest way to get your results is to register for My Chart. ° ° °IF you received an x-ray today, you will receive an invoice from [facility]. Please contact [facility] at [phone] with questions or concerns regarding your invoice.  ° °IF you received labwork today, you will receive an invoice from [facility]. Please contact [facility] at [phone] with questions or concerns regarding your invoice.  ° °Our billing staff will not be able to assist you with questions regarding bills from these companies. ° °You will be contacted with the lab results as soon as they are available. The fastest way to get your results is to activate your My Chart account. Instructions are located on the last page of this paperwork. If you have not heard from us regarding the results in 2 weeks, please contact this office. °  ° ° ° °

## 2019-02-16 MED FILL — AMOXICILLIN 875 MG TABS: 875 | 10 days supply | Qty: 20 | Fill #0

## 2019-02-16 MED FILL — CHLORHEXIDINE 0.12% RINSE: 0.12 | 16 days supply | Qty: 473 | Fill #0

## 2019-02-20 DIAGNOSIS — R944 Abnormal results of kidney function studies: Secondary | ICD-10-CM | POA: Insufficient documentation

## 2019-02-20 NOTE — Progress Notes (Signed)
TeleHealth Visit:  Due to the COVID-19 pandemic, this visit was completed with telemedicine (audio/video) technology to reduce patient and provider exposure as well as to preserve personal protective equipment.   Gabrielle Castro has verbally consented to this TeleHealth visit. The patient is located at home, the provider is located at the [facility] office. The participants in this visit include the listed provider and patient and any and all parties involved. The visit was conducted today via telephone.  [place] was unable to use realtime audiovisual technology today and the telehealth visit was conducted via telephone (time spent on call 24 minutes, 4:19-4:43 PM).  Chief Complaint: OBESITY Gabrielle Castro is here to discuss her progress with her obesity treatment plan along with follow-up of her obesity related diagnoses. Gabrielle Castro is on the Category 2 Plan +100 calories and states she is following her eating plan approximately 75% of the time. Gabrielle Castro states she is exercising 0 minutes 0 times per week. (weight 186 lbs 02/14/19)  Today's visit was #: 20 Starting weight: 190 lbs Starting date: 12/02/2017  Interim History: Gabrielle Castro has stuck to the plan fairly well over the holidays. She does want to improve compliance with the plan. [name] has not been exercising due to shortness of breath secondary to a bout with COVID in November.  Subjective:   Low GFR (new) Gabrielle Castro has a new diagnosis of low GFR (51 on 02/13/19). GFR before that was 60 (08/16/18). 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 She knows to avoid NSAID's but she admits to not drinking enough water. Her creatinine has been elevated in the past and was elevated on 02/13/19. Lab Results  Component Value Date   CREATININE 1.16 (H) 02/13/2019      Assessment/Plan:   Low GFR (new) Gabrielle Castro will avoid NSAID's and she will increase her water intake.   Gabrielle Castro is currently in the action stage  of change. As such, her goal is to continue with weight loss efforts. She has agreed to on the Category 2 Plan +100 calories.   Gabrielle Castro will start exercising as tolerated.  We discussed the following behavioral modification strategies today: decreasing simple carbohydrates and planning for success.  Gabrielle Castro has agreed to follow-up with our clinic in 4 weeks (patient requests 4 weeks). She was informed of the importance of frequent follow-up visits to maximize her success with intensive lifestyle modifications for her multiple health conditions.  Objective:   VITALS: Per patient if applicable, see vitals. GENERAL: Alert and in no acute distress. CARDIOPULMONARY: No increased WOB. Speaking in clear sentences.  PSYCH: Pleasant and cooperative. Speech normal rate and rhythm. Affect is appropriate. Insight and judgement are appropriate. Attention is focused, linear, and appropriate.  NEURO: Oriented as arrived to appointment on time with no prompting.   Lab Results  Component Value Date   CREATININE 1.16 (H) 02/13/2019   BUN 24 02/13/2019   NA 139 02/13/2019   K 4.7 02/13/2019   CL 101 02/13/2019   CO2 23 02/13/2019   Lab Results  Component Value Date   ALT 15 02/13/2019   AST 18 02/13/2019   ALKPHOS 70 02/13/2019   BILITOT 0.2 02/13/2019   Lab Results  Component Value Date   HGBA1C 5.5 02/13/2019   HGBA1C 5.4 08/16/2018   HGBA1C 5.5 12/02/2017   Lab Results  Component Value Date   INSULIN 8.2 12/02/2017   Lab Results  Component Value Date   TSH 0.574 08/16/2018   Lab  Results  Component Value Date   CHOL 221 (H) 02/13/2019   HDL 76 02/13/2019   LDLCALC 114 (H) 02/13/2019   TRIG 180 (H) 02/13/2019   CHOLHDL 2.9 02/13/2019   Lab Results  Component Value Date   WBC 6.3 12/02/2017   HGB 13.9 12/02/2017   HCT 42.5 12/02/2017   MCV 92 12/02/2017   PLT 230 04/29/2017   No results found for: IRON, TIBC, FERRITIN    Ref. Range 02/13/2019 16:00  Vitamin D,  25-Hydroxy Latest Ref Range: 30.0 - 100.0 ng/mL 48.7    Attestation Statements:   Reviewed by clinician on day of visit: allergies, medications, problem list, medical history, surgical history, family history, social history, and previous encounter notes.  Corey Skains, am acting as Location manager for [employer], FNP-C.  I have reviewed the above documentation for accuracy and completeness, and I agree with the above. - Georgianne Fick, FNP

## 2019-02-27 MED FILL — QVAR REDIHALER 40 MCG/ACT A: 40 | 30 days supply | Qty: 11 | Fill #9

## 2019-03-02 ENCOUNTER — Encounter: Payer: Self-pay | Admitting: Family Medicine

## 2019-03-14 ENCOUNTER — Other Ambulatory Visit: Payer: Self-pay

## 2019-03-14 ENCOUNTER — Encounter (INDEPENDENT_AMBULATORY_CARE_PROVIDER_SITE_OTHER): Payer: Self-pay | Admitting: Family Medicine

## 2019-03-14 ENCOUNTER — Ambulatory Visit (INDEPENDENT_AMBULATORY_CARE_PROVIDER_SITE_OTHER): Payer: 59 | Admitting: Family Medicine

## 2019-03-14 VITALS — BP 121/76 | HR 70 | Temp 98.5°F | Ht 63.0 in | Wt 188.0 lb

## 2019-03-14 DIAGNOSIS — E669 Obesity, unspecified: Secondary | ICD-10-CM

## 2019-03-14 DIAGNOSIS — Z6833 Body mass index (BMI) 33.0-33.9, adult: Secondary | ICD-10-CM | POA: Diagnosis not present

## 2019-03-14 DIAGNOSIS — J449 Chronic obstructive pulmonary disease, unspecified: Secondary | ICD-10-CM

## 2019-03-14 NOTE — Progress Notes (Signed)
Chief Complaint:   OBESITY Gabrielle Castro is here to discuss her progress with her obesity treatment plan along with follow-up of her obesity related diagnoses. Gabrielle Castro is on the Category 2 Plan and states she is following her eating plan approximately 75% of the time. Gabrielle Castro states she is exercising 0 minutes 0 times per week.  Today's visit was #: 21 Starting weight: 190 lbs Starting date: 12/02/2017 Today's weight: 188 lbs Today's date: 03/14/2019 Total lbs lost to date: 0 Total lbs lost since last in-office visit: 0  Interim History: Gabrielle Castro has struggled with temptations in the home recently (simple carbs). She has not started back with exercise since her bout with COVID in November.  Subjective:   Chronic obstructive pulmonary disease, unspecified COPD type (Gabrielle Castro) Gabrielle Castro does tend to have increased shortness of breath with exercise. This was exacerbated when she had COVID in November. She is on Qvar daily and albuterol as needed.  Assessment/Plan:   Chronic obstructive pulmonary disease, unspecified COPD type (Gabrielle Castro) Gabrielle Castro was advised to use her albuterol before exercise.  Class 1 obesity with serious comorbidity and body mass index (BMI) of 33.0 to 33.9 in adult, unspecified obesity type Gabrielle Castro is currently in the action stage of change. As such, her goal is to continue with weight loss efforts. She has agreed to the Category 2 Plan.   Exercise goals: Gabrielle Castro will begin walking for exercise this week.  Behavioral modification strategies: decreasing simple carbohydrates and planning for success.  We discussed the low carb plan today and it was provided to patient. She may switch to this if she feels it will work for her.  Gabrielle Castro has agreed to follow-up with our clinic in 4 weeks. She was informed of the importance of frequent follow-up visits to maximize her success with intensive lifestyle modifications for her multiple health conditions.   Objective:    Blood pressure 121/76, pulse 70, temperature 98.5 F (36.9 C), temperature source Oral, height 5\' 3"  (1.6 m), weight 188 lb (85.3 kg), SpO2 96 %. Body mass index is 33.3 kg/m.  General: Cooperative, alert, well developed, in no acute distress. HEENT: Conjunctivae and lids unremarkable. Cardiovascular: Regular rhythm.  Lungs: Normal work of breathing. Neurologic: No focal deficits.   Lab Results  Component Value Date   CREATININE 1.16 (H) 02/13/2019   BUN 24 02/13/2019   NA 139 02/13/2019   K 4.7 02/13/2019   CL 101 02/13/2019   CO2 23 02/13/2019   Lab Results  Component Value Date   ALT 15 02/13/2019   AST 18 02/13/2019   ALKPHOS 70 02/13/2019   BILITOT 0.2 02/13/2019   Lab Results  Component Value Date   HGBA1C 5.5 02/13/2019   HGBA1C 5.4 08/16/2018   HGBA1C 5.5 12/02/2017   Lab Results  Component Value Date   INSULIN 8.2 12/02/2017   Lab Results  Component Value Date   TSH 0.574 08/16/2018   Lab Results  Component Value Date   CHOL 221 (H) 02/13/2019   HDL 76 02/13/2019   LDLCALC 114 (H) 02/13/2019   TRIG 180 (H) 02/13/2019   CHOLHDL 2.9 02/13/2019   Lab Results  Component Value Date   WBC 6.3 12/02/2017   HGB 13.9 12/02/2017   HCT 42.5 12/02/2017   MCV 92 12/02/2017   PLT 230 04/29/2017   No results found for: IRON, TIBC, FERRITIN   Ref. Range 02/13/2019 16:00  Vitamin D, 25-Hydroxy Latest Ref Range: 30.0 - 100.0 ng/mL 48.7  Attestation Statements:   Reviewed by clinician on day of visit: allergies, medications, problem list, medical history, surgical history, family history, social history, and previous encounter notes.  Corey Skains, am acting as Location manager for [employer], FNP-C.  I have reviewed the above documentation for accuracy and completeness, and I agree with the above. -  Trelon Plush [employer], FNP-C

## 2019-03-15 ENCOUNTER — Other Ambulatory Visit: Payer: Self-pay | Admitting: Family Medicine

## 2019-03-15 DIAGNOSIS — Z1231 Encounter for screening mammogram for malignant neoplasm of breast: Secondary | ICD-10-CM

## 2019-03-20 ENCOUNTER — Encounter: Payer: Self-pay | Admitting: Family Medicine

## 2019-03-21 MED ORDER — FLOVENT HFA 44 MCG/ACT IN AERO: 2.0000 | INHALATION_SPRAY | Freq: Two times a day (BID) | RESPIRATORY_TRACT | 12 refills | Status: DC

## 2019-03-21 MED FILL — FLOVENT HFA 44 MCG INHALER: 44 | 30 days supply | Qty: 11 | Fill #0

## 2019-03-25 ENCOUNTER — Other Ambulatory Visit: Payer: Self-pay | Admitting: Family Medicine

## 2019-03-25 DIAGNOSIS — J449 Chronic obstructive pulmonary disease, unspecified: Secondary | ICD-10-CM

## 2019-03-25 MED FILL — ALBUTEROL SULFATE HFA 108 (: 108 (90 BAS | 17 days supply | Qty: 18 | Fill #0

## 2019-03-25 NOTE — Telephone Encounter (Signed)
Requested Prescriptions  Pending Prescriptions Disp Refills  . albuterol (VENTOLIN HFA) 108 (90 Base) MCG/ACT inhaler [Pharmacy Med Name: ALBUTEROL SULFATE HFA 108 ( 108 (90 BAS Aerosol] 18 g 0    Sig: INHALE 2 PUFFS BY MOUTH INTO LUNGS EVERY 4 HOURS AS NEEDED FOR COUGH, WHEEZING, OR SHORTNESS OF BREATH     Pulmonology:  Beta Agonists Failed - 03/25/2019  9:55 AM      Failed - One inhaler should last at least one month. If the patient is requesting refills earlier, contact the patient to check for uncontrolled symptoms.      Passed - Valid encounter within last 12 months    Recent Outpatient Visits          1 month ago Sore throat   Primary [facility] at [facility], Lorelee Market, NP   1 month ago Insulin resistance   [facility] at [facility], Lilia Argue, MD   7 months ago Annual physical exam   [facility] at [facility], Lilia Argue, MD   1 year ago Acute anxiety   [facility] at [facility], Lilia Argue, MD   1 year ago Acute anxiety   Primary [facility] at [facility], Carroll Sage, FNP      Future Appointments            In 4 months Rutherford Guys, MD Primary Care at [place], [facility]

## 2019-04-09 ENCOUNTER — Encounter (INDEPENDENT_AMBULATORY_CARE_PROVIDER_SITE_OTHER): Payer: Self-pay | Admitting: Family Medicine

## 2019-04-12 ENCOUNTER — Ambulatory Visit (INDEPENDENT_AMBULATORY_CARE_PROVIDER_SITE_OTHER): Payer: 59 | Admitting: Family Medicine

## 2019-04-13 MED FILL — SHINGRIX 50 MCG SUS: 50 | 1 days supply | Qty: 1 | Fill #1

## 2019-04-14 ENCOUNTER — Encounter: Payer: Self-pay | Admitting: Family Medicine

## 2019-04-14 ENCOUNTER — Other Ambulatory Visit: Payer: Self-pay

## 2019-04-14 ENCOUNTER — Ambulatory Visit: Payer: 59 | Admitting: Family Medicine

## 2019-04-14 VITALS — BP 124/71 | HR 73 | Temp 98.6°F | Ht 63.0 in | Wt 194.0 lb

## 2019-04-14 DIAGNOSIS — L659 Nonscarring hair loss, unspecified: Secondary | ICD-10-CM

## 2019-04-14 DIAGNOSIS — Z8616 Personal history of COVID-19: Secondary | ICD-10-CM | POA: Diagnosis not present

## 2019-04-14 DIAGNOSIS — J449 Chronic obstructive pulmonary disease, unspecified: Secondary | ICD-10-CM

## 2019-04-14 MED ORDER — ALBUTEROL SULFATE HFA 108 (90 BASE) MCG/ACT IN AERS: INHALATION_SPRAY | RESPIRATORY_TRACT | 5 refills | Status: DC

## 2019-04-14 MED ORDER — TIOTROPIUM BROMIDE MONOHYDRATE 18 MCG IN CAPS: 18.0000 ug | ORAL_CAPSULE | Freq: Every day | RESPIRATORY_TRACT | 12 refills | Status: DC

## 2019-04-14 MED FILL — ALBUTEROL SULFATE HFA 108 (: 108 (90 BAS | 16 days supply | Qty: 18 | Fill #0

## 2019-04-14 MED FILL — SPIRIVA 18 MCG CP-HANDIHALE: 18 | 30 days supply | Qty: 30 | Fill #0

## 2019-04-14 NOTE — Progress Notes (Signed)
3/5/20211:26 PM  Gabrielle Castro Jun 29, 1955, 64 y.o., female [id number]  Chief Complaint  Patient presents with  . Alopecia  . Weight Gain  . flovent inhaler    not working well    HPI:   Patient is a 64 y.o. female with past medical history significant for HLP, Single kidney, prediabetes, COPD, vitamin D deficiency who presents for routine followup  Last OV Jan 2021 - no changes Has been having dry brittle hair, hair loss, brittle dry nails for past couples months  Had covid in November Has been taking biotin  Her breathing has been worse since she had covid She has DOE going from her car into her office, cleaning the house Has noticed increase cough, wheezing Has been using albuterol twice a day and before she walks  Takes flovent daily Last pulm visit in 2018 -  FEV1 1.2 (49%)  Depression screen [facility] 2/9 04/14/2019 02/15/2019 02/13/2019  Decreased Interest 0 0 0  Down, Depressed, Hopeless 0 0 0  PHQ - 2 Score 0 0 0  Altered sleeping - - -  Tired, decreased energy - - -  Change in appetite - - -  Feeling bad or failure about yourself  - - -  Trouble concentrating - - -  Moving slowly or fidgety/restless - - -  Suicidal thoughts - - -  PHQ-9 Score - - -  Difficult doing work/chores - - -    Fall Risk  04/14/2019 02/15/2019 02/13/2019 08/16/2018 11/16/2017  Falls in the past year? 0 - 1 0 No  Comment - - blacked out 01/04/19 - -  Number falls in past yr: 0 0 0 0 -  Injury with Fall? 0 0 0 0 -  Risk for fall due to : - - Other (Comment) - -  Follow up Falls evaluation completed Falls evaluation completed Falls evaluation completed - -     Allergies  Allergen Reactions  . Nickel Swelling, Hives and Itching  . Tape Rash    Prior to Admission medications   Medication Sig Start Date End Date Taking? Authorizing Provider  albuterol (VENTOLIN HFA) 108 (90 Base) MCG/ACT inhaler INHALE 2 PUFFS BY MOUTH INTO LUNGS EVERY 4 HOURS AS NEEDED FOR COUGH, WHEEZING, OR SHORTNESS OF  BREATH 03/25/19  Yes Rutherford Guys, MD  Ascorbic Acid (VITAMIN C) 1000 MG tablet Take 1,000 mg by mouth daily.   Yes [provider]  Biotin 10000 MCG TABS Take 2 tablets by mouth daily.   Yes [provider]  Calcium 600-200 MG-UNIT tablet Take 1 tablet by mouth daily.   Yes [provider]  cholecalciferol (VITAMIN D3) 25 MCG (1000 UT) tablet Take 2,000 Units by mouth daily.   Yes [provider]  COLLAGEN PO Take by mouth daily.   Yes [provider]  fluticasone (FLOVENT HFA) 44 MCG/ACT inhaler Inhale 2 puffs into the lungs 2 (two) times daily. 03/21/19  Yes Rutherford Guys, MD  Lysine 1000 MG TABS Take 1 tablet by mouth daily. Pt stated that she is only taken 500mg .   Yes [provider]  SUPER B COMPLEX/C PO Take 2 capsules by mouth daily.   Yes [provider]  estradiol (ESTRACE) 0.1 MG/GM vaginal cream estradiol 0.01% (0.1 mg/gram) vaginal cream  Apply small amount to area qd for 2 weeks, then 1-2 times a week after that    [provider]  famotidine (PEPCID) 20 mg     [provider]  Multiple Vitamin (  MULTIVITAMIN ADULT PO) Take 100 mg by mouth 1 day or 1 dose. Neuriva    [provider]  [facility] injection  02/21/19   [provider]    Past Medical History:  Diagnosis Date  . Anxiety   . COPD (chronic obstructive pulmonary disease) ([place])   . Dyspnea   . EP (ectopic pregnancy)   . GERD (gastroesophageal reflux disease)   . Sore throat 02/15/2019    Past Surgical History:  Procedure Laterality Date  . BREAST EXCISIONAL BIOPSY Right 10+ years ago   benign  . BREAST SURGERY    . EYE SURGERY    . HAND SURGERY    . KIDNEY DONATION    . KIDNEY DONATION    . TUBAL LIGATION      Social History   Tobacco Use  . Smoking status: Former Smoker    Packs/day: 1.00    Years: 41.00    Pack years: 41.00    Types: Cigarettes    Quit date: 03/03/2016    Years since quitting: 3.1    . Smokeless tobacco: Never Used  Substance Use Topics  . Alcohol use: Yes    Family History  Problem Relation Age of Onset  . Lung cancer Father        smoked  . Cancer Father        lung; +TOB  . Cancer Paternal Grandmother        "every kind of cancer"  . Heart disease Mother   . Diabetes Mother   . Hypertension Mother   . Hyperlipidemia Mother     Review of Systems  Constitutional: Negative for chills and fever.  Respiratory: Positive for cough, shortness of breath and wheezing. Negative for sputum production.   Cardiovascular: Negative for chest pain, palpitations and leg swelling.  Gastrointestinal: Negative for abdominal pain, nausea and vomiting.     OBJECTIVE:  Today's Vitals   04/14/19 1338  BP: 124/71  Pulse: 73  Temp: 98.6 F (37 C)  SpO2: 98%  Weight: 194 lb (88 kg)  Height: 5\' 3"  (1.6 m)   Body mass index is 34.37 kg/m.   Physical Exam Vitals and nursing note reviewed.  Constitutional:      Appearance: She is well-developed.  HENT:     Head: Normocephalic and atraumatic.     Mouth/Throat:     Pharynx: No oropharyngeal exudate.  Eyes:     General: No scleral icterus.    Conjunctiva/sclera: Conjunctivae normal.     Pupils: Pupils are equal, round, and reactive to light.  Cardiovascular:     Rate and Rhythm: Normal rate and regular rhythm.     Heart sounds: Normal heart sounds. No murmur. No friction rub. No gallop.   Pulmonary:     Effort: Pulmonary effort is normal.     Breath sounds: Normal breath sounds. No wheezing or rales.  Musculoskeletal:     Cervical back: Neck supple.  Skin:    General: Skin is warm and dry.  Neurological:     Mental Status: She is alert and oriented to person, place, and time.     No results found for this or any previous visit (from the past 24 hour(s)).  No results found.   ASSESSMENT and PLAN  1. Hair loss Discussed most likely telogen effluvium 2/2 covid. Discussed natural course of process.  Check TSH in 2 weeks off biotin.  - TSH; Future  2. COPD GOLD II/III  Not controlled. Adding spiriva. Reviewed r/se/b. Patient  declines pulm referral.  - albuterol (VENTOLIN HFA) 108 (90 Base) MCG/ACT inhaler; INHALE 2 PUFFS BY MOUTH INTO LUNGS EVERY 4 HOURS AS NEEDED FOR COUGH, WHEEZING, OR SHORTNESS OF BREATH  Other orders - tiotropium (SPIRIVA) 18 MCG inhalation capsule; Place 1 capsule (18 mcg total) into inhaler and inhale daily.  Return in about 4 weeks (around 05/12/2019).    Rutherford Guys, MD Primary Care at [place], [place] [address] Ph.  [phone] Fax [phone]

## 2019-04-14 NOTE — Patient Instructions (Signed)
° ° ° °  If you have lab work done today you will be contacted with your lab results within the next 2 weeks.  If you have not heard from us then please contact us. The fastest way to get your results is to register for My Chart. ° ° °IF you received an x-ray today, you will receive an invoice from [facility]. Please contact [facility] at [phone] with questions or concerns regarding your invoice.  ° °IF you received labwork today, you will receive an invoice from [facility]. Please contact [facility] at [phone] with questions or concerns regarding your invoice.  ° °Our billing staff will not be able to assist you with questions regarding bills from these companies. ° °You will be contacted with the lab results as soon as they are available. The fastest way to get your results is to activate your My Chart account. Instructions are located on the last page of this paperwork. If you have not heard from us regarding the results in 2 weeks, please contact this office. °  ° ° ° °

## 2019-04-18 ENCOUNTER — Ambulatory Visit: Payer: 59 | Admitting: Family Medicine

## 2019-04-28 ENCOUNTER — Ambulatory Visit (INDEPENDENT_AMBULATORY_CARE_PROVIDER_SITE_OTHER): Payer: 59 | Admitting: Registered Nurse

## 2019-04-28 ENCOUNTER — Other Ambulatory Visit: Payer: Self-pay

## 2019-04-28 DIAGNOSIS — L659 Nonscarring hair loss, unspecified: Secondary | ICD-10-CM | POA: Diagnosis not present

## 2019-04-29 LAB — TSH: TSH: 1.47 u[IU]/mL (ref 0.450–4.500)

## 2019-05-05 ENCOUNTER — Encounter: Payer: Self-pay | Admitting: Family Medicine

## 2019-05-08 NOTE — Telephone Encounter (Signed)
Good morning Rhonda,  Can you please look into this?  Thank you I Pamella Pert, MD

## 2019-05-09 DIAGNOSIS — D3131 Benign neoplasm of right choroid: Secondary | ICD-10-CM | POA: Diagnosis not present

## 2019-05-09 DIAGNOSIS — H5203 Hypermetropia, bilateral: Secondary | ICD-10-CM | POA: Diagnosis not present

## 2019-05-11 ENCOUNTER — Other Ambulatory Visit: Payer: Self-pay

## 2019-05-11 ENCOUNTER — Encounter: Payer: Self-pay | Admitting: Family Medicine

## 2019-05-11 ENCOUNTER — Ambulatory Visit: Payer: 59 | Admitting: Family Medicine

## 2019-05-11 VITALS — BP 136/83 | HR 74 | Temp 98.3°F | Resp 16 | Ht 63.0 in | Wt 199.0 lb

## 2019-05-11 DIAGNOSIS — J449 Chronic obstructive pulmonary disease, unspecified: Secondary | ICD-10-CM

## 2019-05-11 DIAGNOSIS — Z6835 Body mass index (BMI) 35.0-35.9, adult: Secondary | ICD-10-CM | POA: Diagnosis not present

## 2019-05-11 NOTE — Progress Notes (Signed)
4/1/20214:07 PM  Gabrielle Castro April 09, 1955, 64 y.o., female [id number]  Chief Complaint  Patient presents with  . COPD    follow up and chronic conditions  . Alopecia    HPI:   Patient is a 64 y.o. female with past medical history significant for HLP, Single kidney, prediabetes, COPD, vitamin D deficiencywho presents for routine followup  Last OV a month ago - started on spiriva Not sure if helping, trying to get used to it Not using flovent anymore Yesterday holding dog for groomer and caused chest tightness and SOB - albuterol resolved, otherwise breathing has been normal  Cont to struggle with weight loss Was going to [facility] - had to stop due to frequency and copays, got discouraged as felt was not losing weight  Lab Results  Component Value Date   TSH 1.470 04/28/2019    Depression screen [facility] 2/9 05/11/2019 04/14/2019 02/15/2019  Decreased Interest 0 0 0  Down, Depressed, Hopeless 0 0 0  PHQ - 2 Score 0 0 0  Altered sleeping - - -  Tired, decreased energy - - -  Change in appetite - - -  Feeling bad or failure about yourself  - - -  Trouble concentrating - - -  Moving slowly or fidgety/restless - - -  Suicidal thoughts - - -  PHQ-9 Score - - -  Difficult doing work/chores - - -    Fall Risk  05/11/2019 04/14/2019 02/15/2019 02/13/2019 08/16/2018  Falls in the past year? 0 0 - 1 0  Comment - - - blacked out 01/04/19 -  Number falls in past yr: - 0 0 0 0  Injury with Fall? - 0 0 0 0  Risk for fall due to : - - - Other (Comment) -  Follow up Falls evaluation completed Falls evaluation completed Falls evaluation completed Falls evaluation completed -     Allergies  Allergen Reactions  . Nickel Swelling, Hives and Itching  . Tape Rash    Prior to Admission medications   Medication Sig Start Date End Date Taking? Authorizing Provider  albuterol (VENTOLIN HFA) 108 (90 Base) MCG/ACT inhaler INHALE 2 PUFFS BY MOUTH INTO LUNGS EVERY 4 HOURS AS NEEDED FOR COUGH, WHEEZING, OR  SHORTNESS OF BREATH 04/14/19  Yes Rutherford Guys, MD  Ascorbic Acid (VITAMIN C) 1000 MG tablet Take 1,000 mg by mouth daily.   Yes [provider]  Biotin 10000 MCG TABS Take 2 tablets by mouth daily.   Yes [provider]  Calcium 600-200 MG-UNIT tablet Take 1 tablet by mouth daily.   Yes [provider]  cholecalciferol (VITAMIN D3) 25 MCG (1000 UT) tablet Take 2,000 Units by mouth daily.   Yes [provider]  COLLAGEN PO Take by mouth daily.   Yes [provider]  estradiol (ESTRACE) 0.1 MG/GM vaginal cream estradiol 0.01% (0.1 mg/gram) vaginal cream  Apply small amount to area qd for 2 weeks, then 1-2 times a week after that   Yes [provider]  famotidine (PEPCID) 20 mg    Yes [provider]  Lysine 1000 MG TABS Take 1 tablet by mouth daily. Pt stated that she is only taken 500mg .   Yes [provider]  Multiple Vitamin (MULTIVITAMIN ADULT PO) Take 100 mg by mouth 1 day or 1 dose. Neuriva   Yes [provider]  [facility] injection  02/21/19  Yes [provider]  SUPER B COMPLEX/C PO Take 2 capsules by mouth daily.  Yes [provider]  tiotropium (SPIRIVA) 18 MCG inhalation capsule Place 1 capsule (18 mcg total) into inhaler and inhale daily. 04/14/19  Yes Rutherford Guys, MD  fluticasone (FLOVENT HFA) 44 MCG/ACT inhaler Inhale 2 puffs into the lungs 2 (two) times daily. 03/21/19   Rutherford Guys, MD    Past Medical History:  Diagnosis Date  . Anxiety   . COPD (chronic obstructive pulmonary disease) ([place])   . Dyspnea   . EP (ectopic pregnancy)   . GERD (gastroesophageal reflux disease)   . Sore throat 02/15/2019    Past Surgical History:  Procedure Laterality Date  . BREAST EXCISIONAL BIOPSY Right 10+ years ago   benign  . BREAST SURGERY    . EYE SURGERY    . HAND SURGERY    . KIDNEY DONATION    . KIDNEY DONATION    . TUBAL LIGATION      Social History   Tobacco Use  .  Smoking status: Former Smoker    Packs/day: 1.00    Years: 41.00    Pack years: 41.00    Types: Cigarettes    Quit date: 03/03/2016    Years since quitting: 3.1  . Smokeless tobacco: Never Used  Substance Use Topics  . Alcohol use: Yes    Family History  Problem Relation Age of Onset  . Lung cancer Father        smoked  . Cancer Father        lung; +TOB  . Cancer Paternal Grandmother        "every kind of cancer"  . Heart disease Mother   . Diabetes Mother   . Hypertension Mother   . Hyperlipidemia Mother     Review of Systems  Constitutional: Negative for chills and fever.  Cardiovascular: Negative for chest pain, palpitations and leg swelling.  Gastrointestinal: Negative for abdominal pain, nausea and vomiting.   Per hpi  OBJECTIVE:  Today's Vitals   05/11/19 1556  BP: 136/83  Pulse: 74  Resp: 16  Temp: 98.3 F (36.8 C)  TempSrc: Temporal  SpO2: 93%  Weight: 199 lb (90.3 kg)  Height: 5\' 3"  (1.6 m)   Body mass index is 35.25 kg/m.  Wt Readings from Last 3 Encounters:  05/11/19 199 lb (90.3 kg)  04/14/19 194 lb (88 kg)  03/14/19 188 lb (85.3 kg)    Physical Exam Vitals and nursing note reviewed.  Constitutional:      Appearance: She is well-developed.  HENT:     Head: Normocephalic and atraumatic.     Mouth/Throat:     Pharynx: No oropharyngeal exudate.  Eyes:     General: No scleral icterus.    Conjunctiva/sclera: Conjunctivae normal.     Pupils: Pupils are equal, round, and reactive to light.  Cardiovascular:     Rate and Rhythm: Normal rate and regular rhythm.     Heart sounds: Normal heart sounds. No murmur. No friction rub. No gallop.   Pulmonary:     Effort: Pulmonary effort is normal.     Breath sounds: Normal breath sounds. No wheezing or rales.  Musculoskeletal:     Cervical back: Neck supple.  Skin:    General: Skin is warm and dry.  Neurological:     Mental Status: She is alert and oriented to person, place, and time.     No  results found for this or any previous visit (from the past 24 hour(s)).  No results found.   ASSESSMENT and PLAN  1. COPD GOLD II/III  Stable. Cont current regime  2. BMI 35.0-35.9,adult Discussed that indeed she had been slowly losing weight when following [facility] advised resume eating recommendation of high protein low fat and low carb diet. Increase exercise.   Return in about 6 months (around 11/10/2019).    Rutherford Guys, MD Primary Care at [facility] [place], [place] [address] Ph.  [phone] Fax [phone]

## 2019-05-11 NOTE — Patient Instructions (Signed)
° ° ° °  If you have lab work done today you will be contacted with your lab results within the next 2 weeks.  If you have not heard from us then please contact us. The fastest way to get your results is to register for My Chart. ° ° °IF you received an x-ray today, you will receive an invoice from [facility]. Please contact [facility] at [phone] with questions or concerns regarding your invoice.  ° °IF you received labwork today, you will receive an invoice from [facility]. Please contact [facility] at [phone] with questions or concerns regarding your invoice.  ° °Our billing staff will not be able to assist you with questions regarding bills from these companies. ° °You will be contacted with the lab results as soon as they are available. The fastest way to get your results is to activate your My Chart account. Instructions are located on the last page of this paperwork. If you have not heard from us regarding the results in 2 weeks, please contact this office. °  ° ° ° °

## 2019-05-18 MED FILL — SPIRIVA 18 MCG CP-HANDIHALE: 18 | 30 days supply | Qty: 30 | Fill #1

## 2019-06-01 MED FILL — ALBUTEROL SULFATE HFA 108 (: 108 (90 BAS | 16 days supply | Qty: 18 | Fill #1

## 2019-07-03 MED FILL — ALBUTEROL SULFATE HFA 108 (: 108 (90 BAS | 16 days supply | Qty: 18 | Fill #2

## 2019-08-15 ENCOUNTER — Ambulatory Visit: Payer: 59 | Admitting: Family Medicine

## 2019-08-22 MED FILL — ALBUTEROL SULFATE HFA 108 (: 108 (90 BAS | 16 days supply | Qty: 18 | Fill #3

## 2019-08-22 MED FILL — SPIRIVA 18 MCG CP-HANDIHALE: 18 | 30 days supply | Qty: 30 | Fill #4

## 2019-09-12 ENCOUNTER — Encounter: Payer: Self-pay | Admitting: Family Medicine

## 2019-09-14 ENCOUNTER — Other Ambulatory Visit: Payer: Self-pay

## 2019-09-14 ENCOUNTER — Ambulatory Visit (INDEPENDENT_AMBULATORY_CARE_PROVIDER_SITE_OTHER): Payer: 59 | Admitting: Family Medicine

## 2019-09-14 ENCOUNTER — Encounter: Payer: Self-pay | Admitting: Family Medicine

## 2019-09-14 VITALS — BP 139/80 | HR 90 | Temp 99.0°F | Ht 63.0 in | Wt 203.0 lb

## 2019-09-14 DIAGNOSIS — R202 Paresthesia of skin: Secondary | ICD-10-CM | POA: Diagnosis not present

## 2019-09-14 DIAGNOSIS — M549 Dorsalgia, unspecified: Secondary | ICD-10-CM

## 2019-09-14 MED ORDER — GABAPENTIN 300 MG PO CAPS: 300.0000 mg | ORAL_CAPSULE | Freq: Three times a day (TID) | ORAL | 3 refills | Status: DC

## 2019-09-14 MED ORDER — TRAMADOL HCL 50 MG PO TABS: 50.0000 mg | ORAL_TABLET | Freq: Three times a day (TID) | ORAL | 0 refills | Status: DC | PRN

## 2019-09-14 NOTE — Patient Instructions (Signed)
pcp    If you have lab work done today you will be contacted with your lab results within the next 2 weeks.  If you have not heard from us then please contact us. The fastest way to get your results is to register for My Chart.   IF you received an x-ray today, you will receive an invoice from [facility]. Please contact [facility] at [phone] with questions or concerns regarding your invoice.   IF you received labwork today, you will receive an invoice from [facility]. Please contact [facility] at [phone] with questions or concerns regarding your invoice.   Our billing staff will not be able to assist you with questions regarding bills from these companies.  You will be contacted with the lab results as soon as they are available. The fastest way to get your results is to activate your My Chart account. Instructions are located on the last page of this paperwork. If you have not heard from us regarding the results in 2 weeks, please contact this office.     

## 2019-09-14 NOTE — Progress Notes (Signed)
8/5/20215:11 PM  Gabrielle Castro 20-Apr-1955, 64 y.o., female [id number]  Chief Complaint  Patient presents with  . Numbness    due to heavy lifting, took muscle relaxer thaat was given to her, she does not prefer to take muscle relaxer. Does not want x ray today.     HPI:   Patient is a 64 y.o. female who presents today for Left shoulder blade pain arm numbness after heavy lifting several days ago  She had to carry 5 gallon buckets of water several times this weekend Having pain along edge of shoulder blade, worse when she turns her neck to left side She is also having numbness and tingling going down her to her 4th and 5th fingers She was miserable yesterday, could not sleep She has been taking APAP, does not due nsaids as only has one kidney Robaxin and flexeril makes her very groggy  Depression screen [facility] 2/9 05/11/2019 04/14/2019 02/15/2019  Decreased Interest 0 0 0  Down, Depressed, Hopeless 0 0 0  PHQ - 2 Score 0 0 0  Altered sleeping - - -  Tired, decreased energy - - -  Change in appetite - - -  Feeling bad or failure about yourself  - - -  Trouble concentrating - - -  Moving slowly or fidgety/restless - - -  Suicidal thoughts - - -  PHQ-9 Score - - -  Difficult doing work/chores - - -    Fall Risk  05/11/2019 04/14/2019 02/15/2019 02/13/2019 08/16/2018  Falls in the past year? 0 0 - 1 0  Comment - - - blacked out 01/04/19 -  Number falls in past yr: - 0 0 0 0  Injury with Fall? - 0 0 0 0  Risk for fall due to : - - - Other (Comment) -  Follow up Falls evaluation completed Falls evaluation completed Falls evaluation completed Falls evaluation completed -     Allergies  Allergen Reactions  . Nickel Swelling, Hives and Itching  . Tape Rash    Prior to Admission medications   Medication Sig Start Date End Date Taking? Authorizing Provider  albuterol (VENTOLIN HFA) 108 (90 Base) MCG/ACT inhaler INHALE 2 PUFFS BY MOUTH INTO LUNGS EVERY 4 HOURS AS NEEDED FOR COUGH,  WHEEZING, OR SHORTNESS OF BREATH 04/14/19  Yes Rutherford Guys, MD  Ascorbic Acid (VITAMIN C) 1000 MG tablet Take 1,000 mg by mouth daily.   Yes [provider]  Biotin 10000 MCG TABS Take 2 tablets by mouth daily.   Yes [provider]  Calcium 600-200 MG-UNIT tablet Take 1 tablet by mouth daily.   Yes [provider]  cholecalciferol (VITAMIN D3) 25 MCG (1000 UT) tablet Take 2,000 Units by mouth daily.   Yes [provider]  COLLAGEN PO Take by mouth daily.   Yes [provider]  estradiol (ESTRACE) 0.1 MG/GM vaginal cream estradiol 0.01% (0.1 mg/gram) vaginal cream  Apply small amount to area qd for 2 weeks, then 1-2 times a week after that   Yes [provider]  famotidine (PEPCID) 20 mg    Yes [provider]  Lysine 1000 MG TABS Take 1 tablet by mouth daily. Pt stated that she is only taken 550m.   Yes [provider]  Multiple Vitamin (MULTIVITAMIN ADULT PO) Take 100 mg by mouth 1 day or 1 dose. Neuriva   Yes [provider[facility]  [facility]  Yes [provider]  SUPER B COMPLEX/C PO Take 2  capsules by mouth daily.   Yes [provider]  tiotropium (SPIRIVA) 18 MCG inhalation capsule Place 1 capsule (18 mcg total) into inhaler and inhale daily. 04/14/19  Yes Rutherford Guys, MD  Zoster Vaccine Adjuvanted [facility]) injection Shingrix (PF) 50 mcg/0.5 mL intramuscular suspension, kit   Yes [provider]    Past Medical History:  Diagnosis Date  . Anxiety   . COPD (chronic obstructive pulmonary disease) ([place])   . Dyspnea   . EP (ectopic pregnancy)   . GERD (gastroesophageal reflux disease)   . Sore throat 02/15/2019    Past Surgical History:  Procedure Laterality Date  . BREAST EXCISIONAL BIOPSY Right 10+ years ago   benign  . BREAST SURGERY    . EYE SURGERY    . HAND SURGERY    . KIDNEY DONATION    . KIDNEY DONATION    . TUBAL LIGATION      Social History     Tobacco Use  . Smoking status: Former Smoker    Packs/day: 1.00    Years: 41.00    Pack years: 41.00    Types: Cigarettes    Quit date: 03/03/2016    Years since quitting: 3.5  . Smokeless tobacco: Never Used  Substance Use Topics  . Alcohol use: Yes    Family History  Problem Relation Age of Onset  . Lung cancer Father        smoked  . Cancer Father        lung; +TOB  . Cancer Paternal Grandmother        "every kind of cancer"  . Heart disease Mother   . Diabetes Mother   . Hypertension Mother   . Hyperlipidemia Mother     ROS Per hpi  OBJECTIVE:  Today's Vitals   09/14/19 1709  BP: 139/80  Pulse: 90  Temp: 99 F (37.2 C)  SpO2: 94%  Weight: 203 lb (92.1 kg)  Height: '5\' 3"'$  (1.6 m)   Body mass index is 35.96 kg/m.   Physical Exam Vitals and nursing note reviewed.  Constitutional:      Appearance: She is well-developed.  HENT:     Head: Normocephalic and atraumatic.  Eyes:     General: No scleral icterus.    Conjunctiva/sclera: Conjunctivae normal.     Pupils: Pupils are equal, round, and reactive to light.  Pulmonary:     Effort: Pulmonary effort is normal.  Musculoskeletal:     Left shoulder: Normal. No tenderness or bony tenderness. Normal range of motion. Normal strength. Normal pulse.     Cervical back: Normal and neck supple. No bony tenderness.     Thoracic back: Spasms and tenderness present. No bony tenderness.       Back:  Skin:    General: Skin is warm and dry.  Neurological:     Mental Status: She is alert and oriented to person, place, and time.     No results found for this or any previous visit (from the past 24 hour(s)).  No results found.   ASSESSMENT and PLAN  1. Upper back pain on left side 2. Paresthesia of left upper extremity Discussed supportive measures, new meds r/se/b and RTC precautions. Discussed chiropractor with myofascial experience. pmp reveiwed.  Other orders  - gabapentin (NEURONTIN) 300 MG  capsule; Take 1 capsule (300 mg total) by mouth 3 (three) times daily. - traMADol (ULTRAM) 50 MG tablet; Take 1 tablet (50 mg total) by mouth every 8 (eight) hours as  needed for up to 5 days.  Return if symptoms worsen or fail to improve.    Rutherford Guys, MD Primary Care at [place] [place], [place] [address] Ph.  [phone] Fax [phone]

## 2019-09-15 ENCOUNTER — Encounter: Payer: Self-pay | Admitting: Family Medicine

## 2019-09-20 MED FILL — SPIRIVA 18 MCG CP-HANDIHALE: 18 | 30 days supply | Qty: 30 | Fill #5

## 2019-10-04 ENCOUNTER — Encounter: Payer: Self-pay | Admitting: Family Medicine

## 2019-10-05 MED ORDER — TRAMADOL HCL 50 MG PO TABS: 50.0000 mg | ORAL_TABLET | Freq: Three times a day (TID) | ORAL | 0 refills | Status: DC | PRN

## 2019-10-05 MED FILL — ALBUTEROL SULFATE HFA 108 (: 108 (90 BAS | 16 days supply | Qty: 18 | Fill #4

## 2019-10-05 MED FILL — traMADol HCL 50 MG TABS: 50 | 5 days supply | Qty: 15 | Fill #0

## 2019-10-05 NOTE — Telephone Encounter (Signed)
Pt requesting name of Chiropractor you recommended

## 2019-10-05 NOTE — Telephone Encounter (Signed)
Patient is requesting a refill of the following medications: Requested Prescriptions   Pending Prescriptions Disp Refills  . traMADol (ULTRAM) 50 MG tablet 15 tablet 0    Sig: Take 1 tablet (50 mg total) by mouth every 8 (eight) hours as needed for up to 5 days.    Date of patient request: 10/05/2019 Last office visit: 09/14/2019 Date of last refill: 09/14/2019 Last refill amount: 15 Follow up time period per chart: if worsen or fail to improve

## 2019-10-06 ENCOUNTER — Ambulatory Visit
Admission: RE | Admit: 2019-10-06 | Discharge: 2019-10-06 | Disposition: A | Discharge status: Home / Self Care | Payer: 59 | Source: Ambulatory Visit | Attending: Family Medicine | Admitting: Family Medicine

## 2019-10-06 ENCOUNTER — Other Ambulatory Visit: Payer: Self-pay

## 2019-10-06 DIAGNOSIS — Z1231 Encounter for screening mammogram for malignant neoplasm of breast: Secondary | ICD-10-CM | POA: Diagnosis not present

## 2019-10-06 NOTE — Telephone Encounter (Signed)
Pt waiting on referral to physical medicine from 09/15/2019 will they be calling her soon?

## 2019-10-10 NOTE — Telephone Encounter (Signed)
This referral has still not been sent pt is waiting and getting impatient with the lack of information I can give her. Please get this sent so that she can schedule.

## 2019-10-12 ENCOUNTER — Encounter: Payer: Self-pay | Admitting: Family Medicine

## 2019-10-12 MED ORDER — TRAMADOL HCL 50 MG PO TABS: 50.0000 mg | ORAL_TABLET | Freq: Three times a day (TID) | ORAL | 0 refills | Status: AC | PRN

## 2019-10-12 MED FILL — traMADol HCL 50 MG TABS: 50 | 5 days supply | Qty: 15 | Fill #0

## 2019-10-12 NOTE — Telephone Encounter (Signed)
pmp reviewd, appropriate meds refilled 

## 2019-10-12 NOTE — Telephone Encounter (Signed)
Patient is requesting a refill of the following medications: Requested Prescriptions   Pending Prescriptions Disp Refills  . traMADol (ULTRAM) 50 MG tablet 15 tablet 0    Sig: Take 1 tablet (50 mg total) by mouth every 8 (eight) hours as needed for up to 5 days.    Date of patient request: 10/12/2019 Last office visit: 09/14/2019 Date of last refill: 10/05/2019 Last refill amount: 15 tab Follow up time period per chart: if worsen or fail to improve   Pt has appt with [facility] next Wednesday 10/18/2019 and is requesting a refill till then.

## 2019-10-18 DIAGNOSIS — M9902 Segmental and somatic dysfunction of thoracic region: Secondary | ICD-10-CM | POA: Diagnosis not present

## 2019-10-18 DIAGNOSIS — S43402A Unspecified sprain of left shoulder joint, initial encounter: Secondary | ICD-10-CM | POA: Diagnosis not present

## 2019-10-18 DIAGNOSIS — M9901 Segmental and somatic dysfunction of cervical region: Secondary | ICD-10-CM | POA: Diagnosis not present

## 2019-10-18 DIAGNOSIS — M9907 Segmental and somatic dysfunction of upper extremity: Secondary | ICD-10-CM | POA: Diagnosis not present

## 2019-10-24 MED FILL — SPIRIVA 18 MCG CP-HANDIHALE: 18 | 30 days supply | Qty: 30 | Fill #6

## 2019-11-09 MED FILL — ALBUTEROL SULFATE HFA 108 (: 108 (90 BAS | 16 days supply | Qty: 18 | Fill #5

## 2019-11-10 ENCOUNTER — Other Ambulatory Visit: Payer: Self-pay | Admitting: Family Medicine

## 2019-11-10 ENCOUNTER — Other Ambulatory Visit: Payer: Self-pay

## 2019-11-10 ENCOUNTER — Ambulatory Visit (INDEPENDENT_AMBULATORY_CARE_PROVIDER_SITE_OTHER): Payer: 59 | Admitting: Family Medicine

## 2019-11-10 ENCOUNTER — Encounter: Payer: Self-pay | Admitting: Family Medicine

## 2019-11-10 VITALS — BP 130/86 | HR 71 | Temp 98.4°F | Ht 62.0 in | Wt 205.8 lb

## 2019-11-10 DIAGNOSIS — J449 Chronic obstructive pulmonary disease, unspecified: Secondary | ICD-10-CM

## 2019-11-10 MED ORDER — FLOVENT HFA 110 MCG/ACT IN AERO: 1.0000 | INHALATION_SPRAY | Freq: Two times a day (BID) | RESPIRATORY_TRACT | 12 refills | Status: DC

## 2019-11-10 MED FILL — FLOVENT HFA 110 MCG INHALER: 110 | 60 days supply | Qty: 12 | Fill #0

## 2019-11-10 NOTE — Patient Instructions (Signed)
° ° ° °  If you have lab work done today you will be contacted with your lab results within the next 2 weeks.  If you have not heard from us then please contact us. The fastest way to get your results is to register for My Chart. ° ° °IF you received an x-ray today, you will receive an invoice from [facility]. Please contact [facility] at [phone] with questions or concerns regarding your invoice.  ° °IF you received labwork today, you will receive an invoice from [facility]. Please contact [facility] at [phone] with questions or concerns regarding your invoice.  ° °Our billing staff will not be able to assist you with questions regarding bills from these companies. ° °You will be contacted with the lab results as soon as they are available. The fastest way to get your results is to activate your My Chart account. Instructions are located on the last page of this paperwork. If you have not heard from us regarding the results in 2 weeks, please contact this office. °  ° ° ° °

## 2019-11-10 NOTE — Progress Notes (Signed)
10/1/20214:58 PM  Gabrielle Castro 04-08-1955, 64 y.o., female [id number]  Chief Complaint  Patient presents with  . Medical Management of Chronic Issues    6 m f/u--questions in inhealer   . Fall    try to catch the running dog    HPI:   Patient is a 64 y.o. female with past medical history significant for HLP, Single kidney, prediabetes, COPD, vitamin D deficiencywho presents for routine followup  She reports that her breathing is not well controlled on spiriva She has needed to use albuterol a lot more She is walking daily 30-60 minutes She used to be on qvar 9mcg BID but stopped due to insurance coverage She has been on flovent 18mcg BID in the past and did not do as well She occ takes her husband's water pill x 1  for swollen ankle Has not had PFTs due to clausthrophobia Will be traveling this winter to see her new granddaughter  Lab Results  Component Value Date   HGBA1C 5.5 02/13/2019   HGBA1C 5.4 08/16/2018   HGBA1C 5.5 12/02/2017   Lab Results  Component Value Date   LDLCALC 114 (H) 02/13/2019   CREATININE 1.16 (H) 02/13/2019   Wt Readings from Last 3 Encounters:  11/10/19 205 lb 12.8 oz (93.4 kg)  09/14/19 203 lb (92.1 kg)  05/11/19 199 lb (90.3 kg)   BP Readings from Last 3 Encounters:  11/10/19 130/86  09/14/19 139/80  05/11/19 136/83    Depression screen PHQ 2/9 11/10/2019 05/11/2019 04/14/2019  Decreased Interest 0 0 0  Down, Depressed, Hopeless 0 0 0  PHQ - 2 Score 0 0 0  Altered sleeping - - -  Tired, decreased energy - - -  Change in appetite - - -  Feeling bad or failure about yourself  - - -  Trouble concentrating - - -  Moving slowly or fidgety/restless - - -  Suicidal thoughts - - -  PHQ-9 Score - - -  Difficult doing work/chores - - -    Fall Risk  11/10/2019 05/11/2019 04/14/2019 02/15/2019 02/13/2019  Falls in the past year? 1 0 0 - 1  Comment - - - - blacked out 01/04/19  Number falls in past yr: 1 - 0 0 0  Injury with Fall? 1 - 0 0 0   Risk for fall due to : - - - - Other (Comment)  Follow up Falls evaluation completed Falls evaluation completed Falls evaluation completed Falls evaluation completed Falls evaluation completed     Allergies  Allergen Reactions  . Nickel Swelling, Hives and Itching  . Tape Rash    Prior to Admission medications   Medication Sig Start Date End Date Taking? Authorizing Provider  albuterol (VENTOLIN HFA) 108 (90 Base) MCG/ACT inhaler INHALE 2 PUFFS BY MOUTH INTO LUNGS EVERY 4 HOURS AS NEEDED FOR COUGH, WHEEZING, OR SHORTNESS OF BREATH 04/14/19  Yes Rutherford Guys, MD  Ascorbic Acid (VITAMIN C) 1000 MG tablet Take 1,000 mg by mouth daily.   Yes [provider]  Biotin 10000 MCG TABS Take 2 tablets by mouth daily.   Yes [provider]  Calcium 600-200 MG-UNIT tablet Take 1 tablet by mouth daily.   Yes [provider]  cholecalciferol (VITAMIN D3) 25 MCG (1000 UT) tablet Take 2,000 Units by mouth daily.   Yes [provider]  COLLAGEN PO Take by mouth daily.   Yes [provider]  estradiol (ESTRACE) 0.1 MG/GM vaginal cream estradiol 0.01% (0.1 mg/gram) vaginal  cream  Apply small amount to area qd for 2 weeks, then 1-2 times a week after that   Yes [provider]  Lysine 1000 MG TABS Take 1 tablet by mouth daily. Pt stated that she is only taken 500mg .   Yes [provider]  Multiple Vitamin (MULTIVITAMIN ADULT PO) Take 100 mg by mouth 1 day or 1 dose. Neuriva   Yes [provider]  SUPER B COMPLEX/C PO Take 2 capsules by mouth daily.   Yes [provider]  tiotropium (SPIRIVA) 18 MCG inhalation capsule Place 1 capsule (18 mcg total) into inhaler and inhale daily. 04/14/19  Yes Rutherford Guys, MD  gabapentin (NEURONTIN) 300 MG capsule Take 1 capsule (300 mg total) by mouth 3 (three) times daily. Patient not taking: Reported on 11/10/2019 09/14/19   Rutherford Guys, MD    Past Medical History:  Diagnosis Date  .  Anxiety   . COPD (chronic obstructive pulmonary disease) ([place])   . Dyspnea   . EP (ectopic pregnancy)   . GERD (gastroesophageal reflux disease)   . Sore throat 02/15/2019    Past Surgical History:  Procedure Laterality Date  . BREAST EXCISIONAL BIOPSY Right 10+ years ago   benign  . BREAST SURGERY    . EYE SURGERY    . HAND SURGERY    . KIDNEY DONATION    . KIDNEY DONATION    . TUBAL LIGATION      Social History   Tobacco Use  . Smoking status: Former Smoker    Packs/day: 1.00    Years: 41.00    Pack years: 41.00    Types: Cigarettes    Quit date: 03/03/2016    Years since quitting: 3.6  . Smokeless tobacco: Never Used  Substance Use Topics  . Alcohol use: Yes    Family History  Problem Relation Age of Onset  . Lung cancer Father        smoked  . Cancer Father        lung; +TOB  . Cancer Paternal Grandmother        "every kind of cancer"  . Heart disease Mother   . Diabetes Mother   . Hypertension Mother   . Hyperlipidemia Mother     Review of Systems  Constitutional: Negative for chills and fever.  Respiratory: Positive for cough and shortness of breath. Negative for wheezing.   Cardiovascular: Negative for chest pain, palpitations and leg swelling.  Gastrointestinal: Negative for abdominal pain, nausea and vomiting.     OBJECTIVE:  Today's Vitals   11/10/19 1616  BP: 130/86  Pulse: 71  Temp: 98.4 F (36.9 C)  TempSrc: Temporal  SpO2: 97%  Weight: 205 lb 12.8 oz (93.4 kg)  Height: 5\' 2"  (1.575 m)   Body mass index is 37.64 kg/m.   Physical Exam Vitals and nursing note reviewed.  Constitutional:      Appearance: She is well-developed.  HENT:     Head: Normocephalic and atraumatic.     Mouth/Throat:     Pharynx: No oropharyngeal exudate.  Eyes:     General: No scleral icterus.    Extraocular Movements: Extraocular movements intact.     Conjunctiva/sclera: Conjunctivae normal.     Pupils: Pupils are equal, round, and reactive to light.   Cardiovascular:     Rate and Rhythm: Normal rate and regular rhythm.     Heart sounds: Normal heart sounds. No murmur heard.  No friction rub. No gallop.   Pulmonary:  Effort: Pulmonary effort is normal.     Breath sounds: Normal breath sounds. No wheezing, rhonchi or rales.  Musculoskeletal:     Cervical back: Neck supple.  Skin:    General: Skin is warm and dry.  Neurological:     Mental Status: She is alert and oriented to person, place, and time.     No results found for this or any previous visit (from the past 24 hour(s)).  No results found.   ASSESSMENT and PLAN  1. COPD GOLD II/III  rx for flovent given. Reviewed r/se/b. Cont walking.   Other orders - fluticasone (FLOVENT HFA) 110 MCG/ACT inhaler; Inhale 1 puff into the lungs in the morning and at bedtime.  Return in about 3 months (around 02/10/2020) for CPE with Huston Foley Just.    Rutherford Guys, MD Primary Care at [facility] [place], [place] [address] Ph.  [phone] Fax [phone]

## 2019-12-12 ENCOUNTER — Encounter: Payer: Self-pay | Admitting: Family Medicine

## 2019-12-27 ENCOUNTER — Encounter: Payer: Self-pay | Admitting: Family Medicine

## 2019-12-27 DIAGNOSIS — J449 Chronic obstructive pulmonary disease, unspecified: Secondary | ICD-10-CM

## 2019-12-28 ENCOUNTER — Other Ambulatory Visit: Payer: Self-pay | Admitting: Family Medicine

## 2019-12-28 MED ORDER — ALBUTEROL SULFATE HFA 108 (90 BASE) MCG/ACT IN AERS: INHALATION_SPRAY | RESPIRATORY_TRACT | 5 refills | Status: DC

## 2019-12-28 MED FILL — ALBUTEROL SULFATE HFA 108 (: 108 (90 BAS | 16 days supply | Qty: 18 | Fill #0

## 2020-01-02 MED FILL — FLOVENT HFA 110 MCG INHALER: 110 | 60 days supply | Qty: 12 | Fill #1

## 2020-02-05 ENCOUNTER — Ambulatory Visit (INDEPENDENT_AMBULATORY_CARE_PROVIDER_SITE_OTHER): Payer: 59 | Admitting: Family Medicine

## 2020-02-05 ENCOUNTER — Encounter: Payer: Self-pay | Admitting: Family Medicine

## 2020-02-05 ENCOUNTER — Other Ambulatory Visit: Payer: Self-pay

## 2020-02-05 VITALS — BP 118/73 | HR 67 | Temp 97.9°F | Ht 62.0 in | Wt 203.0 lb

## 2020-02-05 DIAGNOSIS — Z6833 Body mass index (BMI) 33.0-33.9, adult: Secondary | ICD-10-CM

## 2020-02-05 DIAGNOSIS — J449 Chronic obstructive pulmonary disease, unspecified: Secondary | ICD-10-CM | POA: Diagnosis not present

## 2020-02-05 DIAGNOSIS — E2839 Other primary ovarian failure: Secondary | ICD-10-CM | POA: Diagnosis not present

## 2020-02-05 DIAGNOSIS — Z0000 Encounter for general adult medical examination without abnormal findings: Secondary | ICD-10-CM | POA: Diagnosis not present

## 2020-02-05 DIAGNOSIS — Z1329 Encounter for screening for other suspected endocrine disorder: Secondary | ICD-10-CM | POA: Diagnosis not present

## 2020-02-05 DIAGNOSIS — E669 Obesity, unspecified: Secondary | ICD-10-CM | POA: Diagnosis not present

## 2020-02-05 DIAGNOSIS — Z905 Acquired absence of kidney: Secondary | ICD-10-CM

## 2020-02-05 DIAGNOSIS — E7849 Other hyperlipidemia: Secondary | ICD-10-CM

## 2020-02-05 DIAGNOSIS — E8881 Metabolic syndrome: Secondary | ICD-10-CM | POA: Diagnosis not present

## 2020-02-05 DIAGNOSIS — E559 Vitamin D deficiency, unspecified: Secondary | ICD-10-CM

## 2020-02-05 NOTE — Patient Instructions (Addendum)
COPD and Physical Activity Chronic obstructive pulmonary disease (COPD) is a long-term (chronic) condition that affects the lungs. COPD is a general term that can be used to describe many different lung problems that cause lung swelling (inflammation) and limit airflow, including chronic bronchitis and emphysema. The main symptom of COPD is shortness of breath, which makes it harder to do even simple tasks. This can also make it harder to exercise and be active. Talk with your health care provider about treatments to help you breathe better and actions you can take to prevent breathing problems during physical activity. What are the benefits of exercising with COPD? Exercising regularly is an important part of a healthy lifestyle. You can still exercise and do physical activities even though you have COPD. Exercise and physical activity improve your shortness of breath by increasing blood flow (circulation). This causes your heart to pump more oxygen through your body. Moderate exercise can improve your:  Oxygen use.  Energy level.  Shortness of breath.  Strength in your breathing muscles.  Heart health.  Sleep.  Self-esteem and feelings of self-worth.  Depression, stress, and anxiety levels. Exercise can benefit everyone with COPD. The severity of your disease may affect how hard you can exercise, especially at first, but everyone can benefit. Talk with your health care provider about how much exercise is safe for you, and which activities and exercises are safe for you. What actions can I take to prevent breathing problems during physical activity?  Sign up for a pulmonary rehabilitation program. This type of program may include: ? Education about lung diseases. ? Exercise classes that teach you how to exercise and be more active while improving your breathing. This usually involves:  Exercise using your lower extremities, such as a stationary bicycle.  About 30 minutes of  exercise, 2 to 5 times per week, for 6 to 12 weeks  Strength training, such as push ups or leg lifts. ? Nutrition education. ? Group classes in which you can talk with others who also have COPD and learn ways to manage stress.  If you use an oxygen tank, you should use it while you exercise. Work with your health care provider to adjust your oxygen for your physical activity. Your resting flow rate is different from your flow rate during physical activity.  While you are exercising: ? Take slow breaths. ? Pace yourself and do not try to go too fast. ? Purse your lips while breathing out. Pursing your lips is similar to a kissing or whistling position. ? If doing exercise that uses a quick burst of effort, such as weight lifting:  Breathe in before starting the exercise.  Breathe out during the hardest part of the exercise (such as raising the weights). Where to find support You can find support for exercising with COPD from:  Your health care provider.  A pulmonary rehabilitation program.  Your local health department or community health programs.  Support groups, online or in-person. Your health care provider may be able to recommend support groups. Where to find more information You can find more information about exercising with COPD from:  American Lung Association: ClassInsider.se.  COPD Foundation: https://www.rivera.net/. Contact a health care provider if:  Your symptoms get worse.  You have chest pain.  You have nausea.  You have a fever.  You have trouble talking or catching your breath.  You want to start a new exercise program or a new activity. Summary  COPD is a general term that  can be used to describe many different lung problems that cause lung swelling (inflammation) and limit airflow. This includes chronic bronchitis and emphysema.  Exercise and physical activity improve your shortness of breath by increasing blood flow (circulation). This causes your heart to  provide more oxygen to your body.  Contact your health care provider before starting any exercise program or new activity. Ask your health care provider what exercises and activities are safe for you. This information is not intended to replace advice given to you by your health care provider. Make sure you discuss any questions you have with your health care provider. Document Revised: 05/18/2018 Document Reviewed: 02/18/2017 Elsevier Patient Education  [employer].    If you have lab work done today you will be contacted with your lab results within the next 2 weeks.  If you have not heard from [place] then please contact us. The fastest way to get your results is to register for My Chart.   IF you received an x-ray today, you will receive an invoice from [facility]. Please contact [facility] at [phone] with questions or concerns regarding your invoice.   IF you received labwork today, you will receive an invoice from [facility]. Please contact [facility] at [phone] with questions or concerns regarding your invoice.   Our billing staff will not be able to assist you with questions regarding bills from these companies.  You will be contacted with the lab results as soon as they are available. The fastest way to get your results is to activate your My Chart account. Instructions are located on the last page of this paperwork. If you have not heard from [place] regarding the results in 2 weeks, please contact this office.

## 2020-02-05 NOTE — Progress Notes (Signed)
12/27/20219:00 AM  Gabrielle Castro Dec 05, 1955, 64 y.o., female [id number]  Chief Complaint  Patient presents with  . Annual Exam  . R pinky injury     2 months ago still aches    HPI:   Patient is a 64 y.o. female with past medical history significant for HLD, Single kidney, prediabetes, COPD, vitamin D deficiencywho presents for routine follow-up.  Works in accounts payable Will have first grandson in Feb ([place]) Has 4 kids  COPD Daily flovent Transitioned to this due to unable to afford Qvar Works well Walking daily if able Uses the albuterol when walks  Jammed her right pinky over 2 months ago Still has occasional pain Doesn't take anything for this  Mammogram: 10/06/19 Cologuard: 06/09/17 (negative) PaP: 08/14/16 (will be f/u with gyn) Thinning of the vaginal canal Still uses the estrace cream LMP: greater than 10 years Dexa: never done (ordered)  Lab Results  Component Value Date   CHOL 221 (H) 02/13/2019   HDL 76 02/13/2019   LDLCALC 114 (H) 02/13/2019   TRIG 180 (H) 02/13/2019   CHOLHDL 2.9 02/13/2019   The 10-year ASCVD risk score Mikey Bussing DC Jr., et al., 2013) is: 3.5%   Values used to calculate the score:     Age: 77 years     Sex: Female     Is Non-Hispanic African American: No     Diabetic: No     Tobacco smoker: No     Systolic Blood Pressure: 123456 mmHg     Is BP treated: No     HDL Cholesterol: 76 mg/dL     Total Cholesterol: 221 mg/dL   Depression screen [facility] 2/9 02/05/2020 11/10/2019 05/11/2019  Decreased Interest 0 0 0  Down, Depressed, Hopeless 0 0 0  PHQ - 2 Score 0 0 0  Altered sleeping - - -  Tired, decreased energy - - -  Change in appetite - - -  Feeling bad or failure about yourself  - - -  Trouble concentrating - - -  Moving slowly or fidgety/restless - - -  Suicidal thoughts - - -  PHQ-9 Score - - -  Difficult doing work/chores - - -    Fall Risk  02/05/2020 11/10/2019 05/11/2019 04/14/2019 02/15/2019  Falls in the past  year? 0 1 0 0 -  Comment - - - - -  Number falls in past yr: 0 1 - 0 0  Injury with Fall? 0 1 - 0 0  Risk for fall due to : - - - - -  Follow up Falls evaluation completed Falls evaluation completed Falls evaluation completed Falls evaluation completed Falls evaluation completed     Allergies  Allergen Reactions  . Nickel Swelling, Hives and Itching  . Tape Rash  . Other Itching and Swelling    Prior to Admission medications   Medication Sig Start Date End Date Taking? Authorizing Provider  albuterol (VENTOLIN HFA) 108 (90 Base) MCG/ACT inhaler INHALE 2 PUFFS BY MOUTH INTO LUNGS EVERY 4 HOURS AS NEEDED FOR COUGH, WHEEZING, OR SHORTNESS OF BREATH 12/28/19  Yes Donney Caraveo, Laurita Quint, FNP  Ascorbic Acid (VITAMIN C) 1000 MG tablet Take 1,000 mg by mouth daily.   Yes [provider]  Biotin 10000 MCG TABS Take 2 tablets by mouth daily.   Yes [provider]  Calcium 600-200 MG-UNIT tablet Take 1 tablet by mouth daily.   Yes [provider]  cholecalciferol (VITAMIN D3) 25 MCG (1000 UT) tablet Take 2,000 Units by  mouth daily.   Yes [provider]  COLLAGEN PO Take by mouth daily.   Yes [provider]  estradiol (ESTRACE) 0.1 MG/GM vaginal cream estradiol 0.01% (0.1 mg/gram) vaginal cream  Apply small amount to area qd for 2 weeks, then 1-2 times a week after that   Yes [provider]  fluticasone (FLOVENT HFA) 110 MCG/ACT inhaler Inhale 1 puff into the lungs in the morning and at bedtime. 11/10/19  Yes Lezlie Lye, Irma M, MD  Lysine 1000 MG TABS Take 1 tablet by mouth daily. Pt stated that she is only taken 500mg .   Yes [provider]  Multiple Vitamin (MULTIVITAMIN ADULT PO) Take 100 mg by mouth 1 day or 1 dose. Neuriva   Yes [provider]  omeprazole (PRILOSEC) 20 MG capsule Take 20 mg by mouth daily.   Yes [provider]  SUPER B COMPLEX/C PO Take 2 capsules by mouth daily.   Yes [provider]     Past Medical History:  Diagnosis Date  . Anxiety   . COPD (chronic obstructive pulmonary disease) ([facility])   . Dyspnea   . EP (ectopic pregnancy)   . GERD (gastroesophageal reflux disease)   . Sore throat 02/15/2019    Past Surgical History:  Procedure Laterality Date  . BREAST EXCISIONAL BIOPSY Right 10+ years ago   benign  . BREAST SURGERY    . EYE SURGERY    . HAND SURGERY    . KIDNEY DONATION    . KIDNEY DONATION    . TUBAL LIGATION      Social History   Tobacco Use  . Smoking status: Former Smoker    Packs/day: 1.00    Years: 41.00    Pack years: 41.00    Types: Cigarettes    Quit date: 03/03/2016    Years since quitting: 3.9  . Smokeless tobacco: Never Used  Substance Use Topics  . Alcohol use: Yes    Family History  Problem Relation Age of Onset  . Lung cancer Father        smoked  . Cancer Father        lung; +TOB  . Cancer Paternal Grandmother        "every kind of cancer"  . Heart disease Mother   . Diabetes Mother   . Hypertension Mother   . Hyperlipidemia Mother     Review of Systems  HENT: Negative for hearing loss and tinnitus.   Eyes: Negative for blurred vision, double vision and pain.  Respiratory: Positive for shortness of breath (Treated with inhalers). Negative for cough and wheezing.   Cardiovascular: Negative for chest pain, palpitations and leg swelling.  Gastrointestinal: Negative for abdominal pain, blood in stool, constipation, diarrhea, heartburn, nausea and vomiting.  Genitourinary: Negative for dysuria, frequency and hematuria.  Musculoskeletal: Negative for back pain and joint pain.  Neurological: Negative for dizziness, weakness and headaches.  Endo/Heme/Allergies: Does not bruise/bleed easily.  Psychiatric/Behavioral: Negative for suicidal ideas.     OBJECTIVE:  Today's Vitals   02/05/20 0759  BP: 118/73  Pulse: 67  Temp: 97.9 F (36.6 C)  SpO2: 98%  Weight: 203 lb (92.1 kg)  Height: 5\' 2"  (1.575 m)   Body  mass index is 37.13 kg/m.   Physical Exam Vitals reviewed.  Constitutional:      Appearance: Normal appearance.  HENT:     Right Ear: Tympanic membrane and ear canal normal.     Left Ear: Tympanic membrane and ear canal  normal.     Nose: Nose normal.  Eyes:     Extraocular Movements: Extraocular movements intact.     Pupils: Pupils are equal, round, and reactive to light.  Neck:     Thyroid: No thyroid mass, thyromegaly or thyroid tenderness.  Cardiovascular:     Rate and Rhythm: Normal rate and regular rhythm.     Pulses: Normal pulses.     Heart sounds: Normal heart sounds. No murmur heard. No friction rub. No gallop.   Pulmonary:     Effort: Pulmonary effort is normal. No respiratory distress.     Breath sounds: Normal breath sounds. No stridor. No wheezing, rhonchi or rales.  Abdominal:     General: Bowel sounds are normal. There is no distension.     Palpations: Abdomen is soft.     Tenderness: There is no abdominal tenderness.  Musculoskeletal:        General: No swelling, tenderness or signs of injury. Normal range of motion.     Right hand: Normal.     Left hand: Normal.     Cervical back: Normal range of motion.  Lymphadenopathy:     Cervical: No cervical adenopathy.  Skin:    General: Skin is warm and dry.     Capillary Refill: Capillary refill takes less than 2 seconds.  Neurological:     General: No focal deficit present.     Mental Status: She is alert and oriented to person, place, and time.     Motor: No weakness.     Gait: Gait normal.  Psychiatric:        Mood and Affect: Mood normal.        Behavior: Behavior normal.     No results found for this or any previous visit (from the past 24 hour(s)).  No results found.   ASSESSMENT and PLAN  Problem List Items Addressed This Visit      Respiratory   COPD GOLD II/III  - Primary   Relevant Orders   Comprehensive metabolic panel  Stable on flovent and albuterol No changes to medications at  this time     Endocrine   Insulin resistance   Relevant Orders   Hemoglobin A1c     Other   Single kidney   Vitamin D deficiency   Relevant Orders   Vitamin D, 25-hydroxy  Continues on 6000 iu of Vit D3 daily Will f/u with lab results   Class 1 obesity with serious comorbidity and body mass index (BMI) of 33.0 to 33.9 in adult   Other hyperlipidemia   Relevant Orders   Lipid panel    Other Visit Diagnoses    Annual physical exam       Relevant Orders   CBC   Screening for thyroid disorder       Relevant Orders   TSH   Estrogen deficiency       Relevant Orders   DG Bone Density     Will follow up with lab results.  Return in about 3 months (around 05/05/2020).    Huston Foley Gabriana Wilmott, FNP-BC Primary Care at [place], [place] [address] Ph.  [phone] Fax [phone]

## 2020-02-06 ENCOUNTER — Encounter: Payer: Self-pay | Admitting: Family Medicine

## 2020-02-06 ENCOUNTER — Other Ambulatory Visit: Payer: Self-pay | Admitting: Family Medicine

## 2020-02-06 DIAGNOSIS — R7303 Prediabetes: Secondary | ICD-10-CM | POA: Insufficient documentation

## 2020-02-06 DIAGNOSIS — Z6837 Body mass index (BMI) 37.0-37.9, adult: Secondary | ICD-10-CM

## 2020-02-06 DIAGNOSIS — E782 Mixed hyperlipidemia: Secondary | ICD-10-CM

## 2020-02-06 LAB — CBC
Hematocrit: 42.5 % (ref 34.0–46.6)
Hemoglobin: 14.1 g/dL (ref 11.1–15.9)
MCH: 30.7 pg (ref 26.6–33.0)
MCHC: 33.2 g/dL (ref 31.5–35.7)
MCV: 93 fL (ref 79–97)
Platelets: 227 10*3/uL (ref 150–450)
RBC: 4.59 x10E6/uL (ref 3.77–5.28)
RDW: 13.1 % (ref 11.7–15.4)
WBC: 6.5 10*3/uL (ref 3.4–10.8)

## 2020-02-06 LAB — COMPREHENSIVE METABOLIC PANEL
ALT: 22 IU/L (ref 0–32)
AST: 22 IU/L (ref 0–40)
Albumin/Globulin Ratio: 1.8 (ref 1.2–2.2)
Albumin: 4.2 g/dL (ref 3.8–4.8)
Alkaline Phosphatase: 87 IU/L (ref 44–121)
BUN/Creatinine Ratio: 12 (ref 12–28)
BUN: 13 mg/dL (ref 8–27)
Bilirubin Total: 0.4 mg/dL (ref 0.0–1.2)
CO2: 24 mmol/L (ref 20–29)
Calcium: 9.5 mg/dL (ref 8.7–10.3)
Chloride: 103 mmol/L (ref 96–106)
Creatinine, Ser: 1.08 mg/dL — ABNORMAL HIGH (ref 0.57–1.00)
GFR calc Af Amer: 63 mL/min/{1.73_m2} (ref >59)
GFR calc non Af Amer: 55 mL/min/{1.73_m2} — ABNORMAL LOW (ref >59)
Globulin, Total: 2.4 g/dL (ref 1.5–4.5)
Glucose: 102 mg/dL — ABNORMAL HIGH (ref 65–99)
Potassium: 4.4 mmol/L (ref 3.5–5.2)
Sodium: 141 mmol/L (ref 134–144)
Total Protein: 6.6 g/dL (ref 6.0–8.5)

## 2020-02-06 LAB — HEMOGLOBIN A1C
Est. average glucose Bld gHb Est-mCnc: 117 mg/dL
Hgb A1c MFr Bld: 5.7 % — ABNORMAL HIGH (ref 4.8–5.6)

## 2020-02-06 LAB — TSH: TSH: 1.53 u[IU]/mL (ref 0.450–4.500)

## 2020-02-06 LAB — LIPID PANEL
Chol/HDL Ratio: 3.3 ratio (ref 0.0–4.4)
Cholesterol, Total: 244 mg/dL — ABNORMAL HIGH (ref 100–199)
HDL: 75 mg/dL (ref >39)
LDL Chol Calc (NIH): 144 mg/dL — ABNORMAL HIGH (ref 0–99)
Triglycerides: 144 mg/dL (ref 0–149)
VLDL Cholesterol Cal: 25 mg/dL (ref 5–40)

## 2020-02-06 LAB — VITAMIN D 25 HYDROXY (VIT D DEFICIENCY, FRACTURES): Vit D, 25-Hydroxy: 54.5 ng/mL (ref 30.0–100.0)

## 2020-02-06 MED ORDER — ROSUVASTATIN CALCIUM 5 MG PO TABS: 5.0000 mg | ORAL_TABLET | Freq: Every day | ORAL | 3 refills | Status: DC

## 2020-02-06 MED FILL — ROSUVASTATIN CALCIUM 5 MG T: 5 | 90 days supply | Qty: 90 | Fill #0

## 2020-02-15 MED FILL — ALBUTEROL SULFATE HFA 108 (: 108 (90 BAS | 16 days supply | Qty: 18 | Fill #1

## 2020-03-06 ENCOUNTER — Other Ambulatory Visit: Payer: Self-pay | Admitting: Family Medicine

## 2020-03-06 DIAGNOSIS — E2839 Other primary ovarian failure: Secondary | ICD-10-CM

## 2020-03-06 DIAGNOSIS — Z1231 Encounter for screening mammogram for malignant neoplasm of breast: Secondary | ICD-10-CM

## 2020-03-14 MED FILL — FLOVENT HFA 110 MCG INHALER: 110 | 60 days supply | Qty: 12 | Fill #2

## 2020-03-20 MED FILL — ALBUTEROL SULFATE HFA 108 (: 108 (90 BAS | 16 days supply | Qty: 18 | Fill #2

## 2020-03-22 ENCOUNTER — Encounter: Payer: Self-pay | Admitting: Family Medicine

## 2020-03-25 NOTE — Telephone Encounter (Signed)
Pt asking about estrogen deficiency States this was no discussed with her I don't see estrogen test either to give values  Please advise

## 2020-05-02 ENCOUNTER — Other Ambulatory Visit (HOSPITAL_BASED_OUTPATIENT_CLINIC_OR_DEPARTMENT_OTHER): Payer: Self-pay

## 2020-05-07 ENCOUNTER — Ambulatory Visit: Payer: 59 | Admitting: Family Medicine

## 2020-05-23 ENCOUNTER — Other Ambulatory Visit (HOSPITAL_COMMUNITY): Payer: Self-pay

## 2020-05-23 MED FILL — Albuterol Sulfate Inhal Aero 108 MCG/ACT (90MCG Base Equiv): RESPIRATORY_TRACT | 17 days supply | Qty: 18 | Fill #0 | Status: AC

## 2020-05-23 MED FILL — Rosuvastatin Calcium Tab 5 MG: ORAL | 90 days supply | Qty: 90 | Fill #0 | Status: AC

## 2020-05-23 MED FILL — Fluticasone Propionate HFA Inhal Aer 110 MCG/ACT: RESPIRATORY_TRACT | 30 days supply | Qty: 12 | Fill #0 | Status: AC

## 2020-05-27 ENCOUNTER — Other Ambulatory Visit (HOSPITAL_COMMUNITY): Payer: Self-pay

## 2020-05-28 ENCOUNTER — Encounter: Payer: Self-pay | Admitting: Nurse Practitioner

## 2020-05-28 ENCOUNTER — Other Ambulatory Visit: Payer: Self-pay

## 2020-05-28 ENCOUNTER — Ambulatory Visit: Payer: 59 | Admitting: Nurse Practitioner

## 2020-05-28 VITALS — BP 116/78 | HR 83 | Temp 98.2°F | Ht 63.0 in | Wt 206.0 lb

## 2020-05-28 DIAGNOSIS — J449 Chronic obstructive pulmonary disease, unspecified: Secondary | ICD-10-CM | POA: Diagnosis not present

## 2020-05-28 DIAGNOSIS — E782 Mixed hyperlipidemia: Secondary | ICD-10-CM | POA: Diagnosis not present

## 2020-05-28 DIAGNOSIS — Z7689 Persons encountering health services in other specified circumstances: Secondary | ICD-10-CM | POA: Diagnosis not present

## 2020-05-28 DIAGNOSIS — Z6836 Body mass index (BMI) 36.0-36.9, adult: Secondary | ICD-10-CM | POA: Diagnosis not present

## 2020-05-28 DIAGNOSIS — R7303 Prediabetes: Secondary | ICD-10-CM | POA: Diagnosis not present

## 2020-05-28 DIAGNOSIS — E6609 Other obesity due to excess calories: Secondary | ICD-10-CM | POA: Diagnosis not present

## 2020-05-28 NOTE — Patient Instructions (Addendum)
[employer]itness[employer] to log your foods for your next visit.  Walk at least 45 minutes to 1 hour with a goal of 10,000 steps a day.   Drink at least 64 oz water a day  Avoid sodas and juices   Goal next visit to lose 3-5 lbs.

## 2020-05-28 NOTE — Progress Notes (Signed)
Rutherford Nail as a scribe for [name], FNP.,have documented all relevant documentation on the behalf of [name], FNP,as directed by  [name], FNP while in the presence of [name], [place]. This visit occurred during the SARS-CoV-2 public health emergency.  Safety protocols were in place, including screening questions prior to the visit, additional usage of staff PPE, and extensive cleaning of exam room while observing appropriate contact time as indicated for disinfecting solutions.  Subjective:     Patient ID: Gabrielle Castro , female    DOB: 04-Jan-1956 , 65 y.o.   MRN: [id number]   Chief Complaint  Patient presents with  . Establish Care    HPI  Pt here today to establish care she currently does not have an OB GYN.  Dr. Garth Schlatter was her primary care provider. She had been going to [facility] but they are closed.  Her last visit was in December but was scheduled for March but they were closed.  She was referred by Stevan Born (a patient of Dr. Baird Cancer).  Work for [employer] and [employer]. She is Designer, television/film set. Married for 23 years. Four children - she has 3 daughters and one son. She has 2 grandchildren a 42 year old granddaughter and 32 month old grandson.   She quit smoking 4 years ago.  She is taking Flovent inhaler. She does not want to see a Pulmonologist. She has anxiety with going to the dentist.  She was taking estradiol cream due to bleeding the wall of her vagina (was using as needed). She has not had a hysterectomy.  She had a tubal ligation. Hyperlipidemia (just started statin). 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  She takes lysine for fever blister retention. She had the LEEP procedure and that is when she did not have another menstrual cycle in [place]  Paternal grandmother - had breast cancer with possible mets to the bone.  Brother - emphysema (smoker).      Past Medical  History:  Diagnosis Date  . Anxiety   . COPD (chronic obstructive pulmonary disease) ([place])   . Dyspnea   . EP (ectopic pregnancy)   . GERD (gastroesophageal reflux disease)   . Sore throat 02/15/2019     Family History  Problem Relation Age of Onset  . Lung cancer Father        smoked  . Cancer Father        lung; +TOB  . Cancer Paternal Grandmother        "every kind of cancer"  . Heart disease Mother   . Diabetes Mother   . Hypertension Mother   . Hyperlipidemia Mother      Current Outpatient Medications:  .  albuterol (VENTOLIN HFA) 108 (90 Base) MCG/ACT inhaler, INHALE 2 PUFFS BY MOUTH INTO LUNGS EVERY 4 HOURS AS NEEDED FOR COUGH, WHEEZING, OR SHORTNESS OF BREATH, Disp: 18 g, Rfl: 5 .  Ascorbic Acid (VITAMIN C) 1000 MG tablet, Take 1,000 mg by mouth daily., Disp: , Rfl:  .  Biotin 10000 MCG TABS, Take 2 tablets by mouth daily., Disp: , Rfl:  .  Calcium 600-200 MG-UNIT tablet, Take 1 tablet by mouth daily., Disp: , Rfl:  .  cholecalciferol (VITAMIN D3) 25 MCG (1000 UT) tablet, Take 2,000 Units by mouth daily., Disp: , Rfl:  .  COLLAGEN PO, Take by mouth daily., Disp: , Rfl:  .  estradiol (ESTRACE) 0.1 MG/GM vaginal  cream, estradiol 0.01% (0.1 mg/gram) vaginal cream  Apply small amount to area qd for 2 weeks, then 1-2 times a week after that, Disp: , Rfl:  .  fluticasone (FLOVENT HFA) 110 MCG/ACT inhaler, INHALE 1 PUFF INTO THE LUNGS IN THE MORNING AND AT BEDTIME., Disp: 12 g, Rfl: 12 .  Lysine 1000 MG TABS, Take 1 tablet by mouth daily. Pt stated that she is only taken 521m., Disp: , Rfl:  .  Multiple Vitamin (MULTIVITAMIN ADULT PO), Take 100 mg by mouth 1 day or 1 dose. Neuriva, Disp: , Rfl:  .  rosuvastatin (CRESTOR) 5 MG tablet, TAKE 1 TABLET (5 MG TOTAL) BY MOUTH DAILY., Disp: 90 tablet, Rfl: 3 .  SUPER B COMPLEX/C PO, Take 2 capsules by mouth daily., Disp: , Rfl:    Allergies  Allergen Reactions  . Nickel Swelling, Hives and Itching  . Tape Rash  . Other Itching and  Swelling     Review of Systems  Constitutional: Negative.  Negative for fatigue.  HENT: Negative.   Respiratory: Negative.   Cardiovascular: Negative.  Negative for chest pain, palpitations and leg swelling.  Endocrine: Negative for polydipsia, polyphagia and polyuria.  Musculoskeletal: Negative.   Skin: Negative.   Neurological: Negative for dizziness and headaches.  Psychiatric/Behavioral: Negative.      Today's Vitals   05/28/20 1116  BP: 116/78  Pulse: 83  Temp: 98.2 F (36.8 C)  TempSrc: Oral  SpO2: 98%  Weight: 206 lb (93.4 kg)  Height: '5\' 3"'  (1.6 m)   Body mass index is 36.49 kg/m.  Wt Readings from Last 3 Encounters:  05/28/20 206 lb (93.4 kg)  02/05/20 203 lb (92.1 kg)  11/10/19 205 lb 12.8 oz (93.4 kg)   Objective:  Physical Exam Vitals reviewed.  Constitutional:      General: She is not in acute distress.    Appearance: Normal appearance. She is obese.  Cardiovascular:     Rate and Rhythm: Normal rate and regular rhythm.     Pulses: Normal pulses.     Heart sounds: Normal heart sounds. No murmur heard.   Pulmonary:     Effort: Pulmonary effort is normal. No respiratory distress.     Breath sounds: Normal breath sounds. No wheezing.  Neurological:     General: No focal deficit present.     Mental Status: She is alert and oriented to person, place, and time.     Cranial Nerves: No cranial nerve deficit.  Psychiatric:        Mood and Affect: Mood normal.        Behavior: Behavior normal.        Thought Content: Thought content normal.        Judgment: Judgment normal.         Assessment And Plan:     1. Mixed hyperlipidemia  Will check lipid panel  Currently taking rosuvastatin denies any side effects - CMP14+EGFR - Lipid panel  2. BMI 36.0-36.9,adult  Chronic  Discussed healthy diet and regular exercise options   Encouraged to exercise at least 150 minutes per week with 2 days of strength training  She is encouraged to strive for  BMI less than 30 to decrease cardiac risk.  She reports she has been walking 5 days a week.   - CBC - TSH  3. COPD GOLD II/III  Chronic, she has albuterol inhaler as needed  4. Pre-diabetes  Will check HgbA1c and insulin levels  Encouraged to avoid sugary foods and drinks -  CBC - CMP14+EGFR - Insulin, random - Hemoglobin A1c  5. Encounter to establish care with new doctor    Patient was given opportunity to ask questions. Patient verbalized understanding of the plan and was able to repeat key elements of the plan. All questions were answered to their satisfaction.  [name], FNP   I, [name], FNP, have reviewed all documentation for this visit. The documentation on 06/03/20 for the exam, diagnosis, procedures, and orders are all accurate and complete.    IF YOU HAVE BEEN REFERRED TO A SPECIALIST, IT MAY TAKE 1-2 WEEKS TO SCHEDULE/PROCESS THE REFERRAL. IF YOU HAVE NOT HEARD FROM US/SPECIALIST IN TWO WEEKS, PLEASE GIVE [place] A CALL AT [phone].   THE PATIENT IS ENCOURAGED TO PRACTICE SOCIAL DISTANCING DUE TO THE COVID-19 PANDEMIC.

## 2020-05-29 LAB — CMP14+EGFR
ALT: 21 IU/L (ref 0–32)
AST: 26 IU/L (ref 0–40)
Albumin/Globulin Ratio: 1.8 (ref 1.2–2.2)
Albumin: 4.2 g/dL (ref 3.8–4.8)
Alkaline Phosphatase: 83 IU/L (ref 44–121)
BUN/Creatinine Ratio: 20 (ref 12–28)
BUN: 21 mg/dL (ref 8–27)
Bilirubin Total: 0.2 mg/dL (ref 0.0–1.2)
CO2: 22 mmol/L (ref 20–29)
Calcium: 9.4 mg/dL (ref 8.7–10.3)
Chloride: 100 mmol/L (ref 96–106)
Creatinine, Ser: 1.04 mg/dL — ABNORMAL HIGH (ref 0.57–1.00)
Globulin, Total: 2.3 g/dL (ref 1.5–4.5)
Glucose: 153 mg/dL — ABNORMAL HIGH (ref 65–99)
Potassium: 4.8 mmol/L (ref 3.5–5.2)
Sodium: 138 mmol/L (ref 134–144)
Total Protein: 6.5 g/dL (ref 6.0–8.5)
eGFR: 60 mL/min/{1.73_m2} (ref >59)

## 2020-05-29 LAB — CBC
Hematocrit: 41.9 % (ref 34.0–46.6)
Hemoglobin: 14 g/dL (ref 11.1–15.9)
MCH: 30.7 pg (ref 26.6–33.0)
MCHC: 33.4 g/dL (ref 31.5–35.7)
MCV: 92 fL (ref 79–97)
Platelets: 222 10*3/uL (ref 150–450)
RBC: 4.56 x10E6/uL (ref 3.77–5.28)
RDW: 13.6 % (ref 11.7–15.4)
WBC: 7.2 10*3/uL (ref 3.4–10.8)

## 2020-05-29 LAB — TSH: TSH: 1.33 u[IU]/mL (ref 0.450–4.500)

## 2020-05-29 LAB — LIPID PANEL
Chol/HDL Ratio: 2.3 ratio (ref 0.0–4.4)
Cholesterol, Total: 190 mg/dL (ref 100–199)
HDL: 83 mg/dL (ref >39)
LDL Chol Calc (NIH): 89 mg/dL (ref 0–99)
Triglycerides: 101 mg/dL (ref 0–149)
VLDL Cholesterol Cal: 18 mg/dL (ref 5–40)

## 2020-05-29 LAB — HEMOGLOBIN A1C
Est. average glucose Bld gHb Est-mCnc: 111 mg/dL
Hgb A1c MFr Bld: 5.5 % (ref 4.8–5.6)

## 2020-05-29 LAB — INSULIN, RANDOM: INSULIN: 57.8 u[IU]/mL — ABNORMAL HIGH (ref 2.6–24.9)

## 2020-06-03 NOTE — Progress Notes (Signed)
She needs to cut back on sugary foods and foods that is high in carbohydrates such as potatoes, breads, rices and pastas. We will recheck at her next visit. She also needs to increase her physical activity

## 2020-07-01 MED FILL — Albuterol Sulfate Inhal Aero 108 MCG/ACT (90MCG Base Equiv): RESPIRATORY_TRACT | 17 days supply | Qty: 18 | Fill #1 | Status: AC

## 2020-07-02 ENCOUNTER — Other Ambulatory Visit (HOSPITAL_COMMUNITY): Payer: Self-pay

## 2020-07-04 ENCOUNTER — Other Ambulatory Visit (HOSPITAL_COMMUNITY): Payer: Self-pay

## 2020-07-09 ENCOUNTER — Other Ambulatory Visit: Payer: Self-pay

## 2020-07-09 ENCOUNTER — Ambulatory Visit: Payer: 59 | Admitting: Nurse Practitioner

## 2020-07-09 ENCOUNTER — Encounter: Payer: Self-pay | Admitting: Nurse Practitioner

## 2020-07-09 VITALS — BP 130/68 | HR 70 | Temp 98.0°F | Ht 63.0 in | Wt 200.8 lb

## 2020-07-09 DIAGNOSIS — Z6835 Body mass index (BMI) 35.0-35.9, adult: Secondary | ICD-10-CM

## 2020-07-09 DIAGNOSIS — E8881 Metabolic syndrome: Secondary | ICD-10-CM | POA: Diagnosis not present

## 2020-07-09 DIAGNOSIS — E6609 Other obesity due to excess calories: Secondary | ICD-10-CM

## 2020-07-09 NOTE — Progress Notes (Signed)
I,Tianna Badgett,acting as a Education administrator for [employer], FNP.,have documented all relevant documentation on the behalf of [name], FNP,as directed by  [name], FNP while in the presence of [name], [place].  This visit occurred during the SARS-CoV-2 public health emergency.  Safety protocols were in place, including screening questions prior to the visit, additional usage of staff PPE, and extensive cleaning of exam room while observing appropriate contact time as indicated for disinfecting solutions.  Subjective:     Patient ID: Gabrielle Castro , female    DOB: 02/01/1956 , 65 y.o.   MRN: [id number]   Chief Complaint  Patient presents with  . Weight Loss    HPI  Here to discuss weight loss. She has been logging her food since May 19th, she is doing low carbohydrates. She does eat atkins bars/protein on a regular base. She averages about 60 oz water a day.  She is walking daily 5 days a week at least 30 minutes a day. She does eat cheese almost daily.  She is not keeping track of her calorie intake. She has been [facility] but the diet was so strict she did not do well and she felt she may be bored. She is not interested in taking any medications. She feels like it takes a good little bit of time to get her heart rate. She reports she has not been eating as many sweets since restarting.  She has a goal weight loss down to 140 lbs.    She is not doing any free weights.   Wt Readings from Last 3 Encounters: 07/09/20 : 200 lb 12.8 oz (91.1 kg) 05/28/20 : 206 lb (93.4 kg) 02/05/20 : 203 lb (92.1 kg)     Past Medical History:  Diagnosis Date  . Anxiety   . COPD (chronic obstructive pulmonary disease) ([place])   . Dyspnea   . EP (ectopic pregnancy)   . GERD (gastroesophageal reflux disease)   . Sore throat 02/15/2019     Family History  Problem Relation Age of Onset  . Lung cancer Father        smoked  . Cancer Father        lung; +TOB  . Cancer  Paternal Grandmother        "every kind of cancer"  . Heart disease Mother   . Diabetes Mother   . Hypertension Mother   . Hyperlipidemia Mother      Current Outpatient Medications:  .  albuterol (VENTOLIN HFA) 108 (90 Base) MCG/ACT inhaler, INHALE 2 PUFFS BY MOUTH INTO LUNGS EVERY 4 HOURS AS NEEDED FOR COUGH, WHEEZING, OR SHORTNESS OF BREATH, Disp: 18 g, Rfl: 5 .  Ascorbic Acid (VITAMIN C) 1000 MG tablet, Take 1,000 mg by mouth daily., Disp: , Rfl:  .  Biotin 10000 MCG TABS, Take 2 tablets by mouth daily., Disp: , Rfl:  .  Calcium 600-200 MG-UNIT tablet, Take 1 tablet by mouth daily., Disp: , Rfl:  .  cholecalciferol (VITAMIN D3) 25 MCG (1000 UT) tablet, Take 2,000 Units by mouth daily., Disp: , Rfl:  .  COLLAGEN PO, Take by mouth daily., Disp: , Rfl:  .  estradiol (ESTRACE) 0.1 MG/GM vaginal cream, estradiol 0.01% (0.1 mg/gram) vaginal cream  Apply small amount to area qd for 2 weeks, then 1-2 times a week after that, Disp: , Rfl:  .  fluticasone (FLOVENT HFA) 110 MCG/ACT inhaler, INHALE 1 PUFF INTO THE LUNGS IN THE MORNING AND AT BEDTIME.,  Disp: 12 g, Rfl: 12 .  Lysine 1000 MG TABS, Take 1 tablet by mouth daily. Pt stated that she is only taken 500mg ., Disp: , Rfl:  .  Multiple Vitamin (MULTIVITAMIN ADULT PO), Take 100 mg by mouth 1 day or 1 dose. Neuriva, Disp: , Rfl:  .  rosuvastatin (CRESTOR) 5 MG tablet, TAKE 1 TABLET (5 MG TOTAL) BY MOUTH DAILY., Disp: 90 tablet, Rfl: 3 .  SUPER B COMPLEX/C PO, Take 2 capsules by mouth daily., Disp: , Rfl:    Allergies  Allergen Reactions  . Nickel Swelling, Hives and Itching  . Tape Rash  . Other Itching and Swelling     Review of Systems  Constitutional: Negative.   Respiratory: Negative.  Negative for shortness of breath and wheezing.   Cardiovascular: Negative.   Gastrointestinal: Negative.   Musculoskeletal: Negative.   Neurological: Negative.  Negative for dizziness and headaches.  Psychiatric/Behavioral: Negative.      Today's  Vitals   07/09/20 0833  BP: 130/68  Pulse: 70  Temp: 98 F (36.7 C)  TempSrc: Oral  Weight: 200 lb 12.8 oz (91.1 kg)  Height: 5\' 3"  (1.6 m)   Body mass index is 35.57 kg/m.  Wt Readings from Last 3 Encounters:  07/09/20 200 lb 12.8 oz (91.1 kg)  05/28/20 206 lb (93.4 kg)  02/05/20 203 lb (92.1 kg)    Objective:  Physical Exam Vitals reviewed.  Constitutional:      General: She is not in acute distress.    Appearance: Normal appearance. She is obese.  Cardiovascular:     Rate and Rhythm: Normal rate and regular rhythm.     Pulses: Normal pulses.     Heart sounds: Normal heart sounds. No murmur heard.   Pulmonary:     Effort: Pulmonary effort is normal. No respiratory distress.     Breath sounds: Normal breath sounds. No wheezing.  Skin:    General: Skin is warm and dry.     Coloration: Skin is not jaundiced.  Neurological:     General: No focal deficit present.     Mental Status: She is alert and oriented to person, place, and time.     Cranial Nerves: No cranial nerve deficit.     Motor: No weakness.  Psychiatric:        Mood and Affect: Mood normal.        Behavior: Behavior normal.        Thought Content: Thought content normal.        Judgment: Judgment normal.         Assessment And Plan:     1. Class 2 obesity due to excess calories without serious comorbidity with body mass index (BMI) of 35.0 to 35.9 in adult  Chronic, she is encouraged to increase her exercise goal to 45 minutes daily  Discussed healthy diet to include calorie counting   Encouraged to exercise at least 150 minutes per week with 2 days of strength training   Return in 2 months for weight check.  2. Insulin resistance  She had an elevated insulin level but would like to focus on her carbohydrates and exercise  Will recheck labs at next visit   Patient was given opportunity to ask questions. Patient verbalized understanding of the plan and was able to repeat key elements of  the plan. All questions were answered to their satisfaction.  [name], FNP   I, [name], FNP, have reviewed all documentation for this visit. The documentation  on 07/09/20 for the exam, diagnosis, procedures, and orders are all accurate and complete.   IF YOU HAVE BEEN REFERRED TO A SPECIALIST, IT MAY TAKE 1-2 WEEKS TO SCHEDULE/PROCESS THE REFERRAL. IF YOU HAVE NOT HEARD FROM US/SPECIALIST IN TWO WEEKS, PLEASE GIVE [place] A CALL AT [phone].   THE PATIENT IS ENCOURAGED TO PRACTICE SOCIAL DISTANCING DUE TO THE COVID-19 PANDEMIC.

## 2020-07-09 NOTE — Patient Instructions (Addendum)

## 2020-07-18 ENCOUNTER — Other Ambulatory Visit: Payer: 59

## 2020-07-21 MED FILL — Fluticasone Propionate HFA Inhal Aer 110 MCG/ACT: RESPIRATORY_TRACT | 60 days supply | Qty: 12 | Fill #1 | Status: AC

## 2020-07-22 ENCOUNTER — Other Ambulatory Visit (HOSPITAL_COMMUNITY): Payer: Self-pay

## 2020-07-23 ENCOUNTER — Other Ambulatory Visit (HOSPITAL_COMMUNITY): Payer: Self-pay

## 2020-07-26 ENCOUNTER — Other Ambulatory Visit (HOSPITAL_COMMUNITY): Payer: Self-pay

## 2020-08-02 ENCOUNTER — Other Ambulatory Visit: Payer: Self-pay | Admitting: Family Medicine

## 2020-08-02 ENCOUNTER — Other Ambulatory Visit (HOSPITAL_COMMUNITY): Payer: Self-pay

## 2020-08-02 ENCOUNTER — Other Ambulatory Visit: Payer: Self-pay | Admitting: Nurse Practitioner

## 2020-08-02 DIAGNOSIS — J449 Chronic obstructive pulmonary disease, unspecified: Secondary | ICD-10-CM

## 2020-08-05 ENCOUNTER — Other Ambulatory Visit: Payer: Self-pay | Admitting: Nurse Practitioner

## 2020-08-05 ENCOUNTER — Other Ambulatory Visit (HOSPITAL_COMMUNITY): Payer: Self-pay

## 2020-08-05 ENCOUNTER — Encounter: Payer: Self-pay | Admitting: Nurse Practitioner

## 2020-08-05 DIAGNOSIS — J449 Chronic obstructive pulmonary disease, unspecified: Secondary | ICD-10-CM

## 2020-08-06 ENCOUNTER — Other Ambulatory Visit (HOSPITAL_COMMUNITY): Payer: Self-pay

## 2020-08-06 MED ORDER — ALBUTEROL SULFATE HFA 108 (90 BASE) MCG/ACT IN AERS
INHALATION_SPRAY | RESPIRATORY_TRACT | 5 refills | Status: DC
  Filled 2020-08-06: qty 18, 16d supply, fill #0
  Filled 2020-09-05: qty 18, 16d supply, fill #1
  Filled 2020-10-02: qty 18, 16d supply, fill #2
  Filled 2020-10-24: qty 18, 16d supply, fill #3
  Filled 2020-11-20: qty 18, 16d supply, fill #4
  Filled 2021-01-04: qty 18, 16d supply, fill #5

## 2020-08-27 MED FILL — Rosuvastatin Calcium Tab 5 MG: ORAL | 90 days supply | Qty: 90 | Fill #1 | Status: AC

## 2020-08-28 ENCOUNTER — Other Ambulatory Visit (HOSPITAL_COMMUNITY): Payer: Self-pay

## 2020-09-05 ENCOUNTER — Other Ambulatory Visit (HOSPITAL_COMMUNITY): Payer: Self-pay

## 2020-10-02 ENCOUNTER — Other Ambulatory Visit (HOSPITAL_COMMUNITY): Payer: Self-pay

## 2020-10-02 MED FILL — Fluticasone Propionate HFA Inhal Aer 110 MCG/ACT: RESPIRATORY_TRACT | 60 days supply | Qty: 12 | Fill #2 | Status: AC

## 2020-10-10 ENCOUNTER — Other Ambulatory Visit: Payer: Self-pay

## 2020-10-10 ENCOUNTER — Ambulatory Visit
Admission: RE | Admit: 2020-10-10 | Discharge: 2020-10-10 | Disposition: A | Discharge status: Home / Self Care | Payer: 59 | Source: Ambulatory Visit | Attending: Family Medicine | Admitting: Family Medicine

## 2020-10-10 DIAGNOSIS — Z1231 Encounter for screening mammogram for malignant neoplasm of breast: Secondary | ICD-10-CM | POA: Diagnosis not present

## 2020-10-24 ENCOUNTER — Other Ambulatory Visit (HOSPITAL_COMMUNITY): Payer: Self-pay

## 2020-11-13 ENCOUNTER — Other Ambulatory Visit: Payer: Self-pay

## 2020-11-13 ENCOUNTER — Encounter: Payer: Self-pay | Admitting: Nurse Practitioner

## 2020-11-13 ENCOUNTER — Other Ambulatory Visit (HOSPITAL_COMMUNITY): Payer: Self-pay

## 2020-11-13 ENCOUNTER — Ambulatory Visit (INDEPENDENT_AMBULATORY_CARE_PROVIDER_SITE_OTHER): Payer: 59 | Admitting: Nurse Practitioner

## 2020-11-13 VITALS — BP 122/78 | HR 70 | Temp 98.1°F | Ht 63.6 in | Wt 194.2 lb

## 2020-11-13 DIAGNOSIS — Z6833 Body mass index (BMI) 33.0-33.9, adult: Secondary | ICD-10-CM | POA: Diagnosis not present

## 2020-11-13 DIAGNOSIS — E6609 Other obesity due to excess calories: Secondary | ICD-10-CM | POA: Diagnosis not present

## 2020-11-13 DIAGNOSIS — Z0000 Encounter for general adult medical examination without abnormal findings: Secondary | ICD-10-CM

## 2020-11-13 DIAGNOSIS — Z720 Tobacco use: Secondary | ICD-10-CM

## 2020-11-13 DIAGNOSIS — Z79899 Other long term (current) drug therapy: Secondary | ICD-10-CM | POA: Diagnosis not present

## 2020-11-13 DIAGNOSIS — M62838 Other muscle spasm: Secondary | ICD-10-CM

## 2020-11-13 DIAGNOSIS — E8881 Metabolic syndrome: Secondary | ICD-10-CM

## 2020-11-13 DIAGNOSIS — Z1211 Encounter for screening for malignant neoplasm of colon: Secondary | ICD-10-CM

## 2020-11-13 DIAGNOSIS — J449 Chronic obstructive pulmonary disease, unspecified: Secondary | ICD-10-CM | POA: Diagnosis not present

## 2020-11-13 DIAGNOSIS — R109 Unspecified abdominal pain: Secondary | ICD-10-CM | POA: Diagnosis not present

## 2020-11-13 DIAGNOSIS — E782 Mixed hyperlipidemia: Secondary | ICD-10-CM

## 2020-11-13 DIAGNOSIS — R7303 Prediabetes: Secondary | ICD-10-CM

## 2020-11-13 DIAGNOSIS — Z01419 Encounter for gynecological examination (general) (routine) without abnormal findings: Secondary | ICD-10-CM | POA: Diagnosis not present

## 2020-11-13 LAB — POCT URINALYSIS DIPSTICK
Bilirubin, UA: NEGATIVE
Blood, UA: NEGATIVE
Glucose, UA: NEGATIVE
Ketones, UA: NEGATIVE
Leukocytes, UA: NEGATIVE
Nitrite, UA: NEGATIVE
Protein, UA: NEGATIVE
Spec Grav, UA: 1.01 (ref 1.010–1.025)
Urobilinogen, UA: 0.2 E.U./dL
pH, UA: 5.5 (ref 5.0–8.0)

## 2020-11-13 MED ORDER — METHOCARBAMOL 500 MG PO TABS
500.0000 mg | ORAL_TABLET | Freq: Four times a day (QID) | ORAL | 0 refills | Status: DC
  Filled 2020-11-13: qty 20, 5d supply, fill #0

## 2020-11-13 NOTE — Addendum Note (Signed)
Addended by: [name] F on: 11/13/2020 02:15 PM   Modules accepted: Orders

## 2020-11-13 NOTE — Progress Notes (Addendum)
I,Katawbba Wiggins,acting as a Education administrator for [employer], FNP.,have documented all relevant documentation on the behalf of [name], FNP,as directed by  [name], FNP while in the presence of [name], [place].   This visit occurred during the SARS-CoV-2 public health emergency.  Safety protocols were in place, including screening questions prior to the visit, additional usage of staff PPE, and extensive cleaning of exam room while observing appropriate contact time as indicated for disinfecting solutions.  Subjective:     Patient ID: Gabrielle Castro , female    DOB: 11/20/1955 , 65 y.o.   MRN: [id number]   Chief Complaint  Patient presents with   Annual Exam    HPI  The patient is here today for a physical examination. The patient states she thinks she has a UTI.  Has been going to the bathroom more frequently.   She reports having a "click" to her hip after jumping over a small area of the sidewalk.  She feels like the spasms are getting worse. She has been using heat.   Wt Readings from Last 3 Encounters: 11/13/20 : 194 lb 3.2 oz (88.1 kg) 07/09/20 : 200 lb 12.8 oz (91.1 kg) 05/28/20 : 206 lb (93.4 kg)      Past Medical History:  Diagnosis Date   Anxiety    COPD (chronic obstructive pulmonary disease) ([facility])    Dyspnea    EP (ectopic pregnancy)    GERD (gastroesophageal reflux disease)    Sore throat 02/15/2019     Family History  Problem Relation Age of Onset   Lung cancer Father        smoked   Cancer Father        lung; +TOB   Cancer Paternal Grandmother        "every kind of cancer"   Heart disease Mother    Diabetes Mother    Hypertension Mother    Hyperlipidemia Mother      Current Outpatient Medications:    albuterol (VENTOLIN HFA) 108 (90 Base) MCG/ACT inhaler, INHALE 2 PUFFS BY MOUTH INTO LUNGS EVERY 4 HOURS AS NEEDED FOR COUGH, WHEEZING, OR SHORTNESS OF BREATH, Disp: 18 g, Rfl: 5   Ascorbic Acid (VITAMIN C) 1000 MG tablet, Take 1,000 mg by mouth  daily., Disp: , Rfl:    Biotin 10000 MCG TABS, Take 2 tablets by mouth daily., Disp: , Rfl:    Calcium 600-200 MG-UNIT tablet, Take 1 tablet by mouth daily., Disp: , Rfl:    cholecalciferol (VITAMIN D3) 25 MCG (1000 UT) tablet, Take 2,000 Units by mouth daily., Disp: , Rfl:    COLLAGEN PO, Take by mouth daily., Disp: , Rfl:    estradiol (ESTRACE) 0.1 MG/GM vaginal cream, estradiol 0.01% (0.1 mg/gram) vaginal cream  Apply small amount to area qd for 2 weeks, then 1-2 times a week after that, Disp: , Rfl:    fluticasone (FLOVENT HFA) 110 MCG/ACT inhaler, INHALE 1 PUFF INTO THE LUNGS IN THE MORNING AND AT BEDTIME., Disp: 12 g, Rfl: 12   Lysine 1000 MG TABS, Take 1 tablet by mouth daily. Pt stated that she is only taken 548m., Disp: , Rfl:    methocarbamol (ROBAXIN) 500 MG tablet, Take 1 tablet (500 mg total) by mouth 4 (four) times daily., Disp: 20 tablet, Rfl: 0   Multiple Vitamin (MULTIVITAMIN ADULT PO), Take 100 mg by mouth 1 day or 1 dose. Neuriva, Disp: , Rfl:    rosuvastatin (CRESTOR) 5 MG tablet, TAKE 1 TABLET (5 MG TOTAL)  BY MOUTH DAILY., Disp: 90 tablet, Rfl: 3   SUPER B COMPLEX/C PO, Take 2 capsules by mouth daily., Disp: , Rfl:    Allergies  Allergen Reactions   Nickel Swelling, Hives and Itching   Tape Rash   Other Itching and Swelling      The patient states she uses tubal ligation for birth control. Last LMP was No LMP recorded. Patient is postmenopausal.. Negative for Dysmenorrhea and Negative for Menorrhagia. Negative for: breast discharge, breast lump(s), breast pain and breast self exam. Associated symptoms include abnormal vaginal bleeding. Pertinent negatives include abnormal bleeding (hematology), anxiety, decreased libido, depression, difficulty falling sleep, dyspareunia, history of infertility, nocturia, sexual dysfunction, sleep disturbances, urinary incontinence, urinary urgency, vaginal discharge and vaginal itching. Diet trying to follow a Keto diet. The patient states  her exercise level is moderately - she is walking for 1.5 hours 7 days a week. 2 days a week she is doing power walks.   The patient's tobacco use is:  Social History   Tobacco Use  Smoking Status Former   Packs/day: 1.00   Years: 41.00   Pack years: 41.00   Types: Cigarettes   Quit date: 03/03/2016   Years since quitting: 4.7  Smokeless Tobacco Never   She has been exposed to passive smoke. The patient's alcohol use is:  Social History   Substance and Sexual Activity  Alcohol Use Yes   Additional information: Last pap 2019, next one scheduled for today.    Review of Systems  Constitutional: Negative.   HENT: Negative.    Eyes: Negative.   Respiratory: Negative.    Cardiovascular: Negative.   Gastrointestinal: Negative.   Endocrine: Negative.   Genitourinary:  Positive for dysuria, flank pain, frequency and pelvic pain.  Musculoskeletal:  Positive for back pain.  Skin: Negative.   Allergic/Immunologic: Negative.   Neurological: Negative.   Hematological: Negative.   Psychiatric/Behavioral: Negative.      Today's Vitals   11/13/20 0839  BP: 122/78  Pulse: 70  Temp: 98.1 F (36.7 C)  Weight: 194 lb 3.2 oz (88.1 kg)  Height: 5' 3.6" (1.615 m)   Body mass index is 33.75 kg/m.  Wt Readings from Last 3 Encounters:  11/13/20 194 lb 3.2 oz (88.1 kg)  07/09/20 200 lb 12.8 oz (91.1 kg)  05/28/20 206 lb (93.4 kg)    BP Readings from Last 3 Encounters:  11/13/20 122/78  07/09/20 130/68  05/28/20 116/78    Objective:  Physical Exam Vitals reviewed.  Constitutional:      General: She is not in acute distress.    Appearance: Normal appearance. She is well-developed. She is obese.  HENT:     Head: Normocephalic and atraumatic.     Right Ear: Hearing, tympanic membrane, ear canal and external ear normal. There is no impacted cerumen.     Left Ear: Hearing, tympanic membrane, ear canal and external ear normal. There is no impacted cerumen.     Nose:     Comments:  Deferred - masked    Mouth/Throat:     Comments: Deferred - masked Eyes:     General: Lids are normal.     Extraocular Movements: Extraocular movements intact.     Conjunctiva/sclera: Conjunctivae normal.     Pupils: Pupils are equal, round, and reactive to light.     Funduscopic exam:    Right eye: No papilledema.        Left eye: No papilledema.  Neck:     Thyroid: No thyroid  mass.     Vascular: No carotid bruit.  Cardiovascular:     Rate and Rhythm: Normal rate and regular rhythm.     Pulses: Normal pulses.     Heart sounds: Normal heart sounds. No murmur heard. Pulmonary:     Effort: Pulmonary effort is normal. No respiratory distress.     Breath sounds: Normal breath sounds. No wheezing.  Chest:     Chest wall: No mass.  Breasts:    Tanner Score is 5.     Right: Normal. No mass or tenderness.     Left: Normal. No mass or tenderness.  Abdominal:     General: Abdomen is flat. Bowel sounds are normal. There is no distension.     Palpations: Abdomen is soft.     Tenderness: There is no abdominal tenderness.  Musculoskeletal:        General: No swelling. Normal range of motion.     Cervical back: Full passive range of motion without pain, normal range of motion and neck supple.     Right lower leg: No edema.     Left lower leg: No edema.  Lymphadenopathy:     Upper Body:     Right upper body: No supraclavicular, axillary or pectoral adenopathy.     Left upper body: No supraclavicular, axillary or pectoral adenopathy.  Skin:    General: Skin is warm and dry.     Capillary Refill: Capillary refill takes less than 2 seconds.     Coloration: Skin is not jaundiced.  Neurological:     General: No focal deficit present.     Mental Status: She is alert and oriented to person, place, and time.     Cranial Nerves: No cranial nerve deficit.     Sensory: No sensory deficit.     Motor: No weakness.  Psychiatric:        Mood and Affect: Mood normal.        Behavior: Behavior  normal.        Thought Content: Thought content normal.        Judgment: Judgment normal.        Assessment And Plan:     1. Health maintenance examination Behavior modifications discussed and diet history reviewed.   Pt will continue to exercise regularly and modify diet with low GI, plant based foods and decrease intake of processed foods.  Recommend intake of daily multivitamin, Vitamin D, and calcium.  Recommend mammogram (up to date) cologuard for preventive screenings, as well as recommend immunizations that include influenza, TDAP  2. Insulin resistance Comments: continue Keto diet and exercising regularly will check Insulin level - Insulin, random  3. Encounter for screening colonoscopy Order placed for cologuard - Cologuard  4. Encounter for gynecological examination Would like to be seen by a [facility] provider, female preferred - Ambulatory referral to Gynecology  5. Class 1 obesity due to excess calories without serious comorbidity with body mass index (BMI) of 33.0 to 33.9 in adult Congratulated on her 12 lb weight loss sine April Continue exercising regularly She is encouraged to strive for BMI less than 30 to decrease cardiac risk. Advised to aim for at least 150 minutes of exercise per week.   6. Mixed hyperlipidemia Comments: No current medications, will check lipid panel - CMP14+EGFR - Lipid panel  7. Pre-diabetes Comments: Normal HgbA1c at last visit, continue keto diet - Hemoglobin A1c  8. COPD mixed type [facility]) Comments: She has this managed by her PCP in the  past. Tolerating inhalers well. Continue current medications  9. Other long term (current) drug therapy - CBC  10. Muscle spasm Will treat with Robaxin, flexeril was too strong for her Continue to stretch regularly  11. Flank pain Will check urinalysis, normal findings  12. COPD GOLD II/III  Stable, continue with inhalers  13. Tobacco abuse Smoking cessation instruction/counseling given:   counseled patient on the dangers of tobacco use, advised patient to stop smoking, and reviewed strategies to maximize success   Patient was given opportunity to ask questions. Patient verbalized understanding of the plan and was able to repeat key elements of the plan. All questions were answered to their satisfaction.   [name], FNP   I, [name], FNP, have reviewed all documentation for this visit. The documentation on 11/13/20 for the exam, diagnosis, procedures, and orders are all accurate and complete.   THE PATIENT IS ENCOURAGED TO PRACTICE SOCIAL DISTANCING DUE TO THE COVID-19 PANDEMIC.

## 2020-11-13 NOTE — Patient Instructions (Signed)
Health Maintenance, Female Adopting a healthy lifestyle and getting preventive care are important in promoting health and wellness. Ask your health care provider about: The right schedule for you to have regular tests and exams. Things you can do on your own to prevent diseases and keep yourself healthy. What should I know about diet, weight, and exercise? Eat a healthy diet  Eat a diet that includes plenty of vegetables, fruits, low-fat dairy products, and lean protein. Do not eat a lot of foods that are high in solid fats, added sugars, or sodium. Maintain a healthy weight Body mass index (BMI) is used to identify weight problems. It estimates body fat based on height and weight. Your health care provider can help determine your BMI and help you achieve or maintain a healthy weight. Get regular exercise Get regular exercise. This is one of the most important things you can do for your health. Most adults should: Exercise for at least 150 minutes each week. The exercise should increase your heart rate and make you sweat (moderate-intensity exercise). Do strengthening exercises at least twice a week. This is in addition to the moderate-intensity exercise. Spend less time sitting. Even light physical activity can be beneficial. Watch cholesterol and blood lipids Have your blood tested for lipids and cholesterol at 64 years of age, then have this test every 5 years. Have your cholesterol levels checked more often if: Your lipid or cholesterol levels are high. You are older than 64 years of age. You are at high risk for heart disease. What should I know about cancer screening? Depending on your health history and family history, you may need to have cancer screening at various ages. This may include screening for: Breast cancer. Cervical cancer. Colorectal cancer. Skin cancer. Lung cancer. What should I know about heart disease, diabetes, and high blood pressure? Blood pressure and heart  disease High blood pressure causes heart disease and increases the risk of stroke. This is more likely to develop in people who have high blood pressure readings, are of African descent, or are overweight. Have your blood pressure checked: Every 3-5 years if you are 18-39 years of age. Every year if you are 40 years old or older. Diabetes Have regular diabetes screenings. This checks your fasting blood sugar level. Have the screening done: Once every three years after age 40 if you are at a normal weight and have a low risk for diabetes. More often and at a younger age if you are overweight or have a high risk for diabetes. What should I know about preventing infection? Hepatitis B If you have a higher risk for hepatitis B, you should be screened for this virus. Talk with your health care provider to find out if you are at risk for hepatitis B infection. Hepatitis C Testing is recommended for: Everyone born from 1945 through 1965. Anyone with known risk factors for hepatitis C. Sexually transmitted infections (STIs) Get screened for STIs, including gonorrhea and chlamydia, if: You are sexually active and are younger than 64 years of age. You are older than 64 years of age and your health care provider tells you that you are at risk for this type of infection. Your sexual activity has changed since you were last screened, and you are at increased risk for chlamydia or gonorrhea. Ask your health care provider if you are at risk. Ask your health care provider about whether you are at high risk for HIV. Your health care provider may recommend a prescription medicine   to help prevent HIV infection. If you choose to take medicine to prevent HIV, you should first get tested for HIV. You should then be tested every 3 months for as long as you are taking the medicine. Pregnancy If you are about to stop having your period (premenopausal) and you may become pregnant, seek counseling before you get  pregnant. Take 400 to 800 micrograms (mcg) of folic acid every day if you become pregnant. Ask for birth control (contraception) if you want to prevent pregnancy. Osteoporosis and menopause Osteoporosis is a disease in which the bones lose minerals and strength with aging. This can result in bone fractures. If you are 65 years old or older, or if you are at risk for osteoporosis and fractures, ask your health care provider if you should: Be screened for bone loss. Take a calcium or vitamin D supplement to lower your risk of fractures. Be given hormone replacement therapy (HRT) to treat symptoms of menopause. Follow these instructions at home: Lifestyle Do not use any products that contain nicotine or tobacco, such as cigarettes, e-cigarettes, and chewing tobacco. If you need help quitting, ask your health care provider. Do not use street drugs. Do not share needles. Ask your health care provider for help if you need support or information about quitting drugs. Alcohol use Do not drink alcohol if: Your health care provider tells you not to drink. You are pregnant, may be pregnant, or are planning to become pregnant. If you drink alcohol: Limit how much you use to 0-1 drink a day. Limit intake if you are breastfeeding. Be aware of how much alcohol is in your drink. In the [place] one drink equals one 12 oz bottle of beer (355 mL), one 5 oz glass of wine (148 mL), or one 1 oz glass of hard liquor (44 mL). General instructions Schedule regular health, dental, and eye exams. Stay current with your vaccines. Tell your health care provider if: You often feel depressed. You have ever been abused or do not feel safe at home. Summary Adopting a healthy lifestyle and getting preventive care are important in promoting health and wellness. Follow your health care provider's instructions about healthy diet, exercising, and getting tested or screened for diseases. Follow your health care provider's  instructions on monitoring your cholesterol and blood pressure. This information is not intended to replace advice given to you by your health care provider. Make sure you discuss any questions you have with your health care provider. Document Revised: 04/05/2020 Document Reviewed: 01/19/2018 Elsevier Patient Education  2022 Elsevier Inc.  

## 2020-11-14 LAB — CBC
Hematocrit: 42.1 % (ref 34.0–46.6)
Hemoglobin: 14.1 g/dL (ref 11.1–15.9)
MCH: 30.6 pg (ref 26.6–33.0)
MCHC: 33.5 g/dL (ref 31.5–35.7)
MCV: 91 fL (ref 79–97)
Platelets: 221 10*3/uL (ref 150–450)
RBC: 4.61 x10E6/uL (ref 3.77–5.28)
RDW: 13.9 % (ref 11.7–15.4)
WBC: 6.9 10*3/uL (ref 3.4–10.8)

## 2020-11-14 LAB — CMP14+EGFR
ALT: 25 IU/L (ref 0–32)
AST: 25 IU/L (ref 0–40)
Albumin/Globulin Ratio: 2 (ref 1.2–2.2)
Albumin: 4.7 g/dL (ref 3.8–4.8)
Alkaline Phosphatase: 71 IU/L (ref 44–121)
BUN/Creatinine Ratio: 25 (ref 12–28)
BUN: 24 mg/dL (ref 8–27)
Bilirubin Total: 0.3 mg/dL (ref 0.0–1.2)
CO2: 24 mmol/L (ref 20–29)
Calcium: 9.8 mg/dL (ref 8.7–10.3)
Chloride: 102 mmol/L (ref 96–106)
Creatinine, Ser: 0.96 mg/dL (ref 0.57–1.00)
Globulin, Total: 2.3 g/dL (ref 1.5–4.5)
Glucose: 95 mg/dL (ref 70–99)
Potassium: 4.7 mmol/L (ref 3.5–5.2)
Sodium: 142 mmol/L (ref 134–144)
Total Protein: 7 g/dL (ref 6.0–8.5)
eGFR: 66 mL/min/{1.73_m2} (ref >59)

## 2020-11-14 LAB — HEMOGLOBIN A1C
Est. average glucose Bld gHb Est-mCnc: 117 mg/dL
Hgb A1c MFr Bld: 5.7 % — ABNORMAL HIGH (ref 4.8–5.6)

## 2020-11-14 LAB — INSULIN, RANDOM: INSULIN: 8.7 u[IU]/mL (ref 2.6–24.9)

## 2020-11-14 LAB — LIPID PANEL
Chol/HDL Ratio: 2.6 ratio (ref 0.0–4.4)
Cholesterol, Total: 215 mg/dL — ABNORMAL HIGH (ref 100–199)
HDL: 84 mg/dL (ref >39)
LDL Chol Calc (NIH): 115 mg/dL — ABNORMAL HIGH (ref 0–99)
Triglycerides: 91 mg/dL (ref 0–149)
VLDL Cholesterol Cal: 16 mg/dL (ref 5–40)

## 2020-11-15 ENCOUNTER — Encounter: Payer: Self-pay | Admitting: Nurse Practitioner

## 2020-11-20 ENCOUNTER — Encounter: Payer: Self-pay | Admitting: Nurse Practitioner

## 2020-11-20 ENCOUNTER — Other Ambulatory Visit: Payer: Self-pay

## 2020-11-20 ENCOUNTER — Other Ambulatory Visit (HOSPITAL_COMMUNITY): Payer: Self-pay

## 2020-11-20 MED ORDER — FLUTICASONE PROPIONATE HFA 110 MCG/ACT IN AERO
1.0000 | INHALATION_SPRAY | Freq: Two times a day (BID) | RESPIRATORY_TRACT | 12 refills | Status: DC
  Filled 2020-11-20: qty 12, 60d supply, fill #0
  Filled 2021-01-27: qty 12, 60d supply, fill #1
  Filled 2021-03-12: qty 12, 60d supply, fill #2

## 2020-11-27 ENCOUNTER — Ambulatory Visit: Payer: 59 | Admitting: Podiatry

## 2020-11-27 ENCOUNTER — Encounter: Payer: Self-pay | Admitting: Podiatry

## 2020-11-27 ENCOUNTER — Other Ambulatory Visit: Payer: Self-pay | Admitting: Podiatry

## 2020-11-27 ENCOUNTER — Ambulatory Visit (INDEPENDENT_AMBULATORY_CARE_PROVIDER_SITE_OTHER): Payer: 59

## 2020-11-27 ENCOUNTER — Other Ambulatory Visit: Payer: Self-pay

## 2020-11-27 DIAGNOSIS — M722 Plantar fascial fibromatosis: Secondary | ICD-10-CM

## 2020-11-27 DIAGNOSIS — M79672 Pain in left foot: Secondary | ICD-10-CM

## 2020-11-27 DIAGNOSIS — L600 Ingrowing nail: Secondary | ICD-10-CM

## 2020-11-28 ENCOUNTER — Encounter: Payer: Self-pay | Admitting: Podiatry

## 2020-11-28 NOTE — Progress Notes (Signed)
Subjective:  Patient ID: Gabrielle Castro, female    DOB: [birth year],  MRN: [id number]  Chief Complaint  Patient presents with   Foot Pain    Bilateral foot pain    65 y.o. female presents with the above complaint.  Patient presents with complaint of right heel pain that has been going for quite some time..  Patient states she does a lot of walking on her foot.  She is [employer].  She states is worse in the morning and after while sitting down.  She when she is about to take a for step after resting or sitting position tends to be painful.  Is sharp shooting in nature has progressed to gotten worse.  She also has secondary complaint of right medial border ingrown painful to touch.  She states been going for quite some time.  She would like to have it removed.  She denies any other acute complaints.   Review of Systems: Negative except as noted in the HPI. Denies N/V/F/Ch.  Past Medical History:  Diagnosis Date   Anxiety    COPD (chronic obstructive pulmonary disease) (HCC)    Dyspnea    EP (ectopic pregnancy)    GERD (gastroesophageal reflux disease)    Sore throat 02/15/2019    Current Outpatient Medications:    albuterol (VENTOLIN HFA) 108 (90 Base) MCG/ACT inhaler, INHALE 2 PUFFS BY MOUTH INTO LUNGS EVERY 4 HOURS AS NEEDED FOR COUGH, WHEEZING, OR SHORTNESS OF BREATH, Disp: 18 g, Rfl: 5   Ascorbic Acid (VITAMIN C) 1000 MG tablet, Take 1,000 mg by mouth daily., Disp: , Rfl:    Biotin 10000 MCG TABS, Take 2 tablets by mouth daily., Disp: , Rfl:    Calcium 600-200 MG-UNIT tablet, Take 1 tablet by mouth daily., Disp: , Rfl:    cholecalciferol (VITAMIN D3) 25 MCG (1000 UT) tablet, Take 2,000 Units by mouth daily., Disp: , Rfl:    COLLAGEN PO, Take by mouth daily., Disp: , Rfl:    estradiol (ESTRACE) 0.1 MG/GM vaginal cream, estradiol 0.01% (0.1 mg/gram) vaginal cream  Apply small amount to area qd for 2 weeks, then 1-2 times a week after that, Disp: , Rfl:    fluticasone (FLOVENT  HFA) 110 MCG/ACT inhaler, INHALE 1 PUFF INTO THE LUNGS IN THE MORNING AND AT BEDTIME., Disp: 12 g, Rfl: 12   Lysine 1000 MG TABS, Take 1 tablet by mouth daily. Pt stated that she is only taken 500mg ., Disp: , Rfl:    methocarbamol (ROBAXIN) 500 MG tablet, Take 1 tablet (500 mg total) by mouth 4 (four) times daily., Disp: 20 tablet, Rfl: 0   Multiple Vitamin (MULTIVITAMIN ADULT PO), Take 100 mg by mouth 1 day or 1 dose. Neuriva, Disp: , Rfl:    rosuvastatin (CRESTOR) 5 MG tablet, TAKE 1 TABLET (5 MG TOTAL) BY MOUTH DAILY., Disp: 90 tablet, Rfl: 3   SUPER B COMPLEX/C PO, Take 2 capsules by mouth daily., Disp: , Rfl:   Social History   Tobacco Use  Smoking Status Former   Packs/day: 1.00   Years: 41.00   Pack years: 41.00   Types: Cigarettes   Quit date: 03/03/2016   Years since quitting: 4.7  Smokeless Tobacco Never    Allergies  Allergen Reactions   Nickel Swelling, Hives and Itching   Tape Rash   Other Itching and Swelling   Objective:  There were no vitals filed for this visit. There is no height or weight on file to calculate BMI. Constitutional Well  developed. Well nourished.  Vascular Dorsalis pedis pulses palpable bilaterally. Posterior tibial pulses palpable bilaterally. Capillary refill normal to all digits.  No cyanosis or clubbing noted. Pedal hair growth normal.  Neurologic Normal speech. Oriented to person, place, and time. Epicritic sensation to light touch grossly present bilaterally.  Dermatologic Painful ingrowing nail at medial nail borders of the hallux nail right. No open wounds. No skin lesions.  Orthopedic: Normal joint ROM without pain or crepitus bilaterally. No visible deformities. Tender to palpation at the calcaneal tuber right. No pain with calcaneal squeeze right. Ankle ROM diminished range of motion right. Silfverskiold Test: positive right.   Radiographs: Taken and reviewed. No acute fractures or dislocations. No evidence of stress  fracture.  Plantar heel spur absent. Posterior heel spur present.   Assessment:   1. Plantar fasciitis of right foot   2. Ingrown toenail of right foot    Plan:  Patient was evaluated and treated and all questions answered.  Ingrown Nail, right -Patient elects to proceed with minor surgery to remove ingrown toenail removal today. Consent reviewed and signed by patient. -Ingrown nail excised. See procedure note. -Educated on post-procedure care including soaking. Written instructions provided and reviewed. -Patient to follow up in 2 weeks for nail check.  Procedure: Excision of Ingrown Toenail Location: Right 1st toe medial nail borders. Anesthesia: Lidocaine 1% plain; 1.5 mL and Marcaine 0.5% plain; 1.5 mL, digital block. Skin Prep: Betadine. Dressing: Silvadene; telfa; dry, sterile, compression dressing. Technique: Following skin prep, the toe was exsanguinated and a tourniquet was secured at the base of the toe. The affected nail border was freed, split with a nail splitter, and excised. Chemical matrixectomy was then performed with phenol and irrigated out with alcohol. The tourniquet was then removed and sterile dressing applied. Disposition: Patient tolerated procedure well. Patient to return in 2 weeks for follow-up.   No follow-ups on file.  Plantar Fasciitis, right - XR reviewed as above.  - Educated on icing and stretching. Instructions given.  - Injection delivered to the plantar fascia as below. - DME: Plantar Fascial Brace - Pharmacologic management: None  Procedure: Injection Tendon/Ligament Location: Right plantar fascia at the glabrous junction; medial approach. Skin Prep: alcohol Injectate: 0.5 cc 0.5% marcaine plain, 0.5 cc of 1% Lidocaine, 0.5 cc kenalog 10. Disposition: Patient tolerated procedure well. Injection site dressed with a band-aid.  No follow-ups on file.

## 2020-12-02 ENCOUNTER — Other Ambulatory Visit (HOSPITAL_COMMUNITY): Payer: Self-pay

## 2020-12-02 MED FILL — Rosuvastatin Calcium Tab 5 MG: ORAL | 90 days supply | Qty: 90 | Fill #2 | Status: AC

## 2020-12-03 DIAGNOSIS — H5203 Hypermetropia, bilateral: Secondary | ICD-10-CM | POA: Diagnosis not present

## 2020-12-25 ENCOUNTER — Ambulatory Visit: Payer: 59 | Admitting: Podiatry

## 2020-12-25 ENCOUNTER — Other Ambulatory Visit: Payer: Self-pay

## 2020-12-25 DIAGNOSIS — M722 Plantar fascial fibromatosis: Secondary | ICD-10-CM | POA: Diagnosis not present

## 2020-12-25 DIAGNOSIS — Q666 Other congenital valgus deformities of feet: Secondary | ICD-10-CM

## 2020-12-26 NOTE — Progress Notes (Signed)
Subjective:  Patient ID: Gabrielle Castro, female    DOB: 28-Jul-1955,  MRN: [id number]  Chief Complaint  Patient presents with   Plantar Fasciitis    F/U Rt PF -pt states," much better pain is lesss; 7-8/10 pain after resting.": -Tx; brace     65 y.o. female presents with the above complaint.  Patient presents with follow-up to right Planter fasciitis.  She states that she is doing a little bit better.  The injection helped considerably.  She would like to discuss other treatment options.  She still has some residual pain in the heel.   Review of Systems: Negative except as noted in the HPI. Denies N/V/F/Ch.  Past Medical History:  Diagnosis Date   Anxiety    COPD (chronic obstructive pulmonary disease) (HCC)    Dyspnea    EP (ectopic pregnancy)    GERD (gastroesophageal reflux disease)    Sore throat 02/15/2019    Current Outpatient Medications:    albuterol (VENTOLIN HFA) 108 (90 Base) MCG/ACT inhaler, INHALE 2 PUFFS BY MOUTH INTO LUNGS EVERY 4 HOURS AS NEEDED FOR COUGH, WHEEZING, OR SHORTNESS OF BREATH, Disp: 18 g, Rfl: 5   Ascorbic Acid (VITAMIN C) 1000 MG tablet, Take 1,000 mg by mouth daily., Disp: , Rfl:    Biotin 10000 MCG TABS, Take 2 tablets by mouth daily., Disp: , Rfl:    Calcium 600-200 MG-UNIT tablet, Take 1 tablet by mouth daily., Disp: , Rfl:    cholecalciferol (VITAMIN D3) 25 MCG (1000 UT) tablet, Take 2,000 Units by mouth daily., Disp: , Rfl:    COLLAGEN PO, Take by mouth daily., Disp: , Rfl:    estradiol (ESTRACE) 0.1 MG/GM vaginal cream, estradiol 0.01% (0.1 mg/gram) vaginal cream  Apply small amount to area qd for 2 weeks, then 1-2 times a week after that, Disp: , Rfl:    fluticasone (FLOVENT HFA) 110 MCG/ACT inhaler, INHALE 1 PUFF INTO THE LUNGS IN THE MORNING AND AT BEDTIME., Disp: 12 g, Rfl: 12   Lysine 1000 MG TABS, Take 1 tablet by mouth daily. Pt stated that she is only taken 500mg ., Disp: , Rfl:    methocarbamol (ROBAXIN) 500 MG tablet, Take 1 tablet (500  mg total) by mouth 4 (four) times daily., Disp: 20 tablet, Rfl: 0   Multiple Vitamin (MULTIVITAMIN ADULT PO), Take 100 mg by mouth 1 day or 1 dose. Neuriva, Disp: , Rfl:    rosuvastatin (CRESTOR) 5 MG tablet, TAKE 1 TABLET (5 MG TOTAL) BY MOUTH DAILY., Disp: 90 tablet, Rfl: 3   SUPER B COMPLEX/C PO, Take 2 capsules by mouth daily., Disp: , Rfl:   Social History   Tobacco Use  Smoking Status Former   Packs/day: 1.00   Years: 41.00   Pack years: 41.00   Types: Cigarettes   Quit date: 03/03/2016   Years since quitting: 4.8  Smokeless Tobacco Never    Allergies  Allergen Reactions   Nickel Swelling, Hives and Itching   Tape Rash   Other Itching and Swelling   Objective:  There were no vitals filed for this visit. There is no height or weight on file to calculate BMI. Constitutional Well developed. Well nourished.  Vascular Dorsalis pedis pulses palpable bilaterally. Posterior tibial pulses palpable bilaterally. Capillary refill normal to all digits.  No cyanosis or clubbing noted. Pedal hair growth normal.  Neurologic Normal speech. Oriented to person, place, and time. Epicritic sensation to light touch grossly present bilaterally.  Dermatologic No further ingrown noted. No open wounds. No  skin lesions.  Orthopedic: Normal joint ROM without pain or crepitus bilaterally. No visible deformities. Tender to palpation at the calcaneal tuber right. No pain with calcaneal squeeze right. Ankle ROM diminished range of motion right. Silfverskiold Test: positive right.   Radiographs: Taken and reviewed. No acute fractures or dislocations. No evidence of stress fracture.  Plantar heel spur absent. Posterior heel spur present.   Assessment:   1. Plantar fasciitis of right foot   2. Pes planovalgus     Plan:  Patient was evaluated and treated and all questions answered.  Ingrown Nail, right -Clinically healed  Plantar Fasciitis, right - XR reviewed as above.  - Educated on  icing and stretching. Instructions given.  -Second injection delivered to the plantar fascia as below. - DME: Plantar Fascial Brace - Pharmacologic management: None  Pes planovalgus -I explained to patient the etiology of pes planovalgus and relationship with Planter fasciitis and various treatment options were discussed.  Given patient foot structure in the setting of Planter fasciitis I believe patient will benefit from custom-made orthotics to help control the hindfoot motion support the arch of the foot and take the stress away from plantar fascial.  Patient agrees with the plan like to proceed with orthotics -Patient was casted for orthotics   Procedure: Injection Tendon/Ligament Location: Right plantar fascia at the glabrous junction; medial approach. Skin Prep: alcohol Injectate: 0.5 cc 0.5% marcaine plain, 0.5 cc of 1% Lidocaine, 0.5 cc kenalog 10. Disposition: Patient tolerated procedure well. Injection site dressed with a band-aid.  No follow-ups on file.

## 2021-01-04 ENCOUNTER — Other Ambulatory Visit (HOSPITAL_COMMUNITY): Payer: Self-pay

## 2021-01-24 ENCOUNTER — Ambulatory Visit: Payer: 59 | Admitting: Podiatry

## 2021-01-27 ENCOUNTER — Other Ambulatory Visit (HOSPITAL_COMMUNITY): Payer: Self-pay

## 2021-02-14 ENCOUNTER — Other Ambulatory Visit: Payer: Self-pay | Admitting: Nurse Practitioner

## 2021-02-14 DIAGNOSIS — J449 Chronic obstructive pulmonary disease, unspecified: Secondary | ICD-10-CM

## 2021-02-15 ENCOUNTER — Other Ambulatory Visit (HOSPITAL_COMMUNITY): Payer: Self-pay

## 2021-02-17 ENCOUNTER — Other Ambulatory Visit: Payer: Self-pay | Admitting: Nurse Practitioner

## 2021-02-17 ENCOUNTER — Other Ambulatory Visit: Payer: Self-pay | Admitting: Family Medicine

## 2021-02-17 ENCOUNTER — Other Ambulatory Visit (HOSPITAL_COMMUNITY): Payer: Self-pay

## 2021-02-17 DIAGNOSIS — J449 Chronic obstructive pulmonary disease, unspecified: Secondary | ICD-10-CM

## 2021-02-17 DIAGNOSIS — E782 Mixed hyperlipidemia: Secondary | ICD-10-CM

## 2021-02-17 MED ORDER — ROSUVASTATIN CALCIUM 5 MG PO TABS
ORAL_TABLET | Freq: Every day | ORAL | 3 refills | Status: DC
  Filled 2021-02-17: qty 90, 90d supply, fill #0
  Filled 2021-04-10 – 2021-05-31 (×2): qty 90, 90d supply, fill #1
  Filled 2021-08-27: qty 90, 90d supply, fill #2
  Filled 2021-11-24: qty 90, 90d supply, fill #3

## 2021-02-17 MED ORDER — ALBUTEROL SULFATE HFA 108 (90 BASE) MCG/ACT IN AERS
INHALATION_SPRAY | RESPIRATORY_TRACT | 5 refills | Status: DC
  Filled 2021-02-17: qty 18, 16d supply, fill #0
  Filled 2021-03-12: qty 18, 16d supply, fill #1
  Filled 2021-04-10: qty 18, 16d supply, fill #2
  Filled 2021-05-12: qty 18, 16d supply, fill #3
  Filled 2021-05-31: qty 18, 16d supply, fill #4
  Filled 2021-06-24: qty 18, 16d supply, fill #5

## 2021-03-04 ENCOUNTER — Encounter: Payer: Self-pay | Admitting: Nurse Practitioner

## 2021-03-10 ENCOUNTER — Ambulatory Visit
Admission: RE | Admit: 2021-03-10 | Discharge: 2021-03-10 | Disposition: A | Discharge status: Home / Self Care | Payer: 59 | Source: Ambulatory Visit | Attending: Nurse Practitioner | Admitting: Nurse Practitioner

## 2021-03-10 ENCOUNTER — Encounter: Payer: Self-pay | Admitting: Nurse Practitioner

## 2021-03-10 ENCOUNTER — Ambulatory Visit: Payer: 59 | Admitting: Nurse Practitioner

## 2021-03-10 ENCOUNTER — Other Ambulatory Visit: Payer: Self-pay

## 2021-03-10 VITALS — BP 126/67 | HR 75 | Temp 97.1°F | Ht 63.6 in | Wt 201.4 lb

## 2021-03-10 DIAGNOSIS — J449 Chronic obstructive pulmonary disease, unspecified: Secondary | ICD-10-CM

## 2021-03-10 DIAGNOSIS — Z6835 Body mass index (BMI) 35.0-35.9, adult: Secondary | ICD-10-CM | POA: Diagnosis not present

## 2021-03-10 DIAGNOSIS — E6609 Other obesity due to excess calories: Secondary | ICD-10-CM | POA: Diagnosis not present

## 2021-03-10 DIAGNOSIS — R0602 Shortness of breath: Secondary | ICD-10-CM

## 2021-03-10 DIAGNOSIS — F4321 Adjustment disorder with depressed mood: Secondary | ICD-10-CM | POA: Diagnosis not present

## 2021-03-10 NOTE — Patient Instructions (Signed)
COPD and Physical Activity Chronic obstructive pulmonary disease (COPD) is a long-term, or chronic, condition that affects the lungs. COPD is a general term that can be used to describe many problems that cause inflammation of the lungs and limit airflow. These conditions include chronic bronchitis and emphysema. The main symptom of COPD is shortness of breath, which makes it harder to do even simple tasks. This can also make it harder to exercise and stay active. Talk with your health care provider about treatments to help you breathe better and actions you can take to prevent breathing problems during physical activity. What are the benefits of exercising when you have COPD? Exercising regularly is an important part of a healthy lifestyle. You can still exercise and do physical activities even though you have COPD. Exercise and physical activity improve your shortness of breath by increasing blood flow (circulation). This causes your heart to pump more oxygen through your body. Moderate exercise can: Improve oxygen use. Increase your energy level. Help with shortness of breath. Strengthen your breathing muscles. Improve heart health. Help with sleep. Improve your self-esteem and feelings of self-worth. Lower depression, stress, and anxiety. Exercise can benefit everyone with COPD. The severity of your disease may affect how hard you can exercise, especially at first, but everyone can benefit. Talk with your health care provider about how much exercise is safe for you, and which activities and exercises are safe for you. What actions can I take to prevent breathing problems during physical activity? Sign up for a pulmonary rehabilitation program. This type of program may include: Education about lung diseases. Exercise classes that teach you how to exercise and be more active while improving your breathing. This usually involves: Exercise using your lower extremities, such as a stationary  bicycle. About 30 minutes of exercise, 2 to 5 times per week, for 6 to 12 weeks. Strength training, such as push-ups or leg lifts. Nutrition education. Group classes in which you can talk with others who also have COPD and learn ways to manage stress. If you use an oxygen tank, you should use it while you exercise. Work with your health care provider to adjust your oxygen for your physical activity. Your resting flow rate is different from your flow rate during physical activity. How to manage your breathing while exercising While you are exercising: Take slow breaths. Pace yourself, and do nottry to go too fast. Purse your lips while breathing out. Pursing your lips is similar to a kissing or whistling position. If doing exercise that uses a quick burst of effort, such as weight lifting: Breathe in before starting the exercise. Breathe out during the hardest part of the exercise, such as raising the weights. Where to find support You can find support for exercising with COPD from: Your health care provider. A pulmonary rehabilitation program. Your local health department or community health programs. Support groups, either online or in-person. Your health care provider may be able to recommend support groups. Where to find more information You can find more information about exercising with COPD from: American Lung Association: lung.org COPD Foundation: copdfoundation.org Contact a health care provider if: Your symptoms get worse. You have nausea. You have a fever. You want to start a new exercise program or a new activity. Get help right away if: You have chest pain. You cannot breathe. These symptoms may represent a serious problem that is an emergency. Do not wait to see if the symptoms will go away. Get medical help right away. Call   your local emergency services (911 in the [place] Do not drive yourself to the hospital. Summary COPD is a general term that can be used to describe  many different lung problems that cause lung inflammation and limit airflow. This includes chronic bronchitis and emphysema. Exercise and physical activity improve your shortness of breath by increasing blood flow (circulation). This causes your heart to provide more oxygen to your body. Contact your health care provider before starting any exercise program or new activity. Ask your health care provider what exercises and activities are safe for you. This information is not intended to replace advice given to you by your health care provider. Make sure you discuss any questions you have with your health care provider. Document Revised: 12/05/2019 Document Reviewed: 12/05/2019 Elsevier Patient Education  2022 [employer]  
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
## 2021-03-12 ENCOUNTER — Other Ambulatory Visit (HOSPITAL_COMMUNITY): Payer: Self-pay

## 2021-03-17 ENCOUNTER — Other Ambulatory Visit (HOSPITAL_COMMUNITY): Payer: Self-pay

## 2021-04-08 ENCOUNTER — Telehealth: Payer: Self-pay

## 2021-04-08 NOTE — Telephone Encounter (Signed)
Casts sent to central fabrication - HOLD FOR PATIENT AUTH

## 2021-04-10 ENCOUNTER — Other Ambulatory Visit (HOSPITAL_COMMUNITY): Payer: Self-pay

## 2021-04-16 ENCOUNTER — Other Ambulatory Visit: Payer: Self-pay

## 2021-04-16 ENCOUNTER — Ambulatory Visit: Payer: 59 | Admitting: Pulmonary Disease

## 2021-04-16 ENCOUNTER — Other Ambulatory Visit (HOSPITAL_COMMUNITY): Payer: Self-pay

## 2021-04-16 ENCOUNTER — Encounter: Payer: Self-pay | Admitting: Pulmonary Disease

## 2021-04-16 VITALS — BP 150/82 | HR 70 | Temp 97.8°F | Ht 62.0 in | Wt 207.0 lb

## 2021-04-16 DIAGNOSIS — Z87891 Personal history of nicotine dependence: Secondary | ICD-10-CM

## 2021-04-16 DIAGNOSIS — J449 Chronic obstructive pulmonary disease, unspecified: Secondary | ICD-10-CM

## 2021-04-16 MED ORDER — BREZTRI AEROSPHERE 160-9-4.8 MCG/ACT IN AERO
2.0000 | INHALATION_SPRAY | Freq: Two times a day (BID) | RESPIRATORY_TRACT | 0 refills | Status: DC
  Filled 2021-04-16: qty 10.7, 30d supply, fill #0

## 2021-04-16 MED ORDER — BREZTRI AEROSPHERE 160-9-4.8 MCG/ACT IN AERO: 2.0000 | INHALATION_SPRAY | Freq: Two times a day (BID) | RESPIRATORY_TRACT | 0 refills | Status: DC

## 2021-04-16 NOTE — Progress Notes (Signed)
Synopsis: Referred in March 2023 for COPD by [name], FNP  Subjective:   PATIENT ID: Gabrielle Castro GENDER: female DOB: Feb 28, 1955, MRN: [id number]  Chief Complaint  Patient presents with   Consult    Consult. Patient is here to talk about COPD and SOB    This is a 66 year old female, past medical history of anxiety, COPD, GERD.  Here today for COPD evaluation.  Patient is a former smoker 41-pack-year history quit in 2018.  Father has a history of lung cancer.Patient has not had any axial CT imaging of the chest.  Patient had office-based spirometry and February 2018 which revealed a ratio of 70 and an FEV1 of 49% predicted consistent with severe obstructive defect.  Patient is able to walk daily.  She does have some shortness of breath with exertion.  Overall no significant complaints.  She is able to complete most of her activities of daily living.  She is currently managed with Flovent does not really like her daily maintenance inhaler.     Past Medical History:  Diagnosis Date   Anxiety    COPD (chronic obstructive pulmonary disease) ([place])    Dyspnea    EP (ectopic pregnancy)    GERD (gastroesophageal reflux disease)    Sore throat 02/15/2019     Family History  Problem Relation Age of Onset   Lung cancer Father        smoked   Cancer Father        lung; +TOB   Cancer Paternal Grandmother        "every kind of cancer"   Heart disease Mother    Diabetes Mother    Hypertension Mother    Hyperlipidemia Mother      Past Surgical History:  Procedure Laterality Date   BREAST EXCISIONAL BIOPSY Right 10+ years ago   benign   BREAST SURGERY     EYE SURGERY     HAND SURGERY     KIDNEY DONATION     KIDNEY DONATION     TUBAL LIGATION      Social History   Socioeconomic History   Marital status: Married    Spouse name: Pachia Strum   Number of children: 4   Years of education: Not on file   Highest education level: Associate degree: academic program   Occupational History   Occupation: Teacher, early years/pre: [employer]  Tobacco Use   Smoking status: Former    Packs/day: 1.00    Years: 41.00    Pack years: 41.00    Types: Cigarettes    Quit date: 03/03/2016    Years since quitting: 5.1   Smokeless tobacco: Never  Substance and Sexual Activity   Alcohol use: Yes   Drug use: Never   Sexual activity: Not Currently    Comment: since husband's stroke in 2014  Other Topics Concern   Not on file  Social History Narrative   Lives with her husband, who is disabled due to multiple cardiovascular diagnoses, and her elderly mother.   Her mother is a "pack rat." Her husband doesn't like clutter, and makes snide comments about it.   One daughter lives [place].   Other 2 daughters and son live near [place], [place].    Social Determinants of Health   Financial Resource Strain: Not on file  Food Insecurity: Not on file  Transportation Needs: Not on file  Physical Activity: Not on file  Stress: Not on file  Social Connections: Not on file  Intimate Partner Violence: Not on file     Allergies  Allergen Reactions   Nickel Swelling, Hives and Itching   Tape Rash   Other Itching and Swelling     Outpatient Medications Prior to Visit  Medication Sig Dispense Refill   albuterol (VENTOLIN HFA) 108 (90 Base) MCG/ACT inhaler INHALE 2 PUFFS BY MOUTH INTO LUNGS EVERY 4 HOURS AS NEEDED FOR COUGH, WHEEZING, OR SHORTNESS OF BREATH 18 g 5   Ascorbic Acid (VITAMIN C) 1000 MG tablet Take 1,000 mg by mouth daily.     Biotin 10000 MCG TABS Take 2 tablets by mouth daily.     Calcium 600-200 MG-UNIT tablet Take 1 tablet by mouth daily.     cholecalciferol (VITAMIN D3) 25 MCG (1000 UT) tablet Take 2,000 Units by mouth daily.     COLLAGEN PO Take by mouth daily.     fluticasone (FLOVENT HFA) 110 MCG/ACT inhaler INHALE 1 PUFF INTO THE LUNGS IN THE MORNING AND AT BEDTIME. 12 g 12   Lysine 1000 MG TABS Take 1 tablet by mouth daily. Pt stated that  she is only taken '500mg'$ .     Multiple Vitamin (MULTIVITAMIN ADULT PO) Take 100 mg by mouth 1 day or 1 dose. Neuriva     rosuvastatin (CRESTOR) 5 MG tablet TAKE 1 TABLET (5 MG TOTAL) BY MOUTH DAILY. 90 tablet 3   SUPER B COMPLEX/C PO Take 2 capsules by mouth daily.     No facility-administered medications prior to visit.    Review of Systems  Constitutional:  Negative for chills, fever, malaise/fatigue and weight loss.  HENT:  Negative for hearing loss, sore throat and tinnitus.   Eyes:  Negative for blurred vision and double vision.  Respiratory:  Positive for shortness of breath. Negative for cough, hemoptysis, sputum production, wheezing and stridor.   Cardiovascular:  Negative for chest pain, palpitations, orthopnea, leg swelling and PND.  Gastrointestinal:  Negative for abdominal pain, constipation, diarrhea, heartburn, nausea and vomiting.  Genitourinary:  Negative for dysuria, hematuria and urgency.  Musculoskeletal:  Negative for joint pain and myalgias.  Skin:  Negative for itching and rash.  Neurological:  Negative for dizziness, tingling, weakness and headaches.  Endo/Heme/Allergies:  Negative for environmental allergies. Does not bruise/bleed easily.  Psychiatric/Behavioral:  Negative for depression. The patient is not nervous/anxious and does not have insomnia.   All other systems reviewed and are negative.   Objective:  Physical Exam Vitals reviewed.  Constitutional:      General: She is not in acute distress.    Appearance: She is well-developed.  HENT:     Head: Normocephalic and atraumatic.  Eyes:     General: No scleral icterus.    Conjunctiva/sclera: Conjunctivae normal.     Pupils: Pupils are equal, round, and reactive to light.  Neck:     Vascular: No JVD.     Trachea: No tracheal deviation.  Cardiovascular:     Rate and Rhythm: Normal rate and regular rhythm.     Heart sounds: Normal heart sounds. No murmur heard. Pulmonary:     Effort: Pulmonary effort  is normal. No tachypnea, accessory muscle usage or respiratory distress.     Breath sounds: Normal breath sounds. No stridor. No wheezing, rhonchi or rales.  Abdominal:     General: Bowel sounds are normal. There is no distension.     Palpations: Abdomen is soft.     Tenderness: There is no abdominal tenderness.  Musculoskeletal:  General: No tenderness.     Cervical back: Neck supple.  Lymphadenopathy:     Cervical: No cervical adenopathy.  Skin:    General: Skin is warm and dry.     Capillary Refill: Capillary refill takes less than 2 seconds.     Findings: No rash.  Neurological:     Mental Status: She is alert and oriented to person, place, and time.  Psychiatric:        Behavior: Behavior normal.     Vitals:   04/16/21 0859  BP: (!) 150/82  Pulse: 70  Temp: 97.8 F (36.6 C)  TempSrc: Oral  SpO2: 100%  Weight: 207 lb (93.9 kg)  Height: '5\' 2"'$  (1.575 m)   100% on RA BMI Readings from Last 3 Encounters:  04/16/21 37.86 kg/m  03/10/21 35.01 kg/m  11/13/20 33.75 kg/m   Wt Readings from Last 3 Encounters:  04/16/21 207 lb (93.9 kg)  03/10/21 201 lb 6.4 oz (91.4 kg)  11/13/20 194 lb 3.2 oz (88.1 kg)     CBC    Component Value Date/Time   WBC 6.9 11/13/2020 0928   WBC 8.5 07/19/2017 1535   RBC 4.61 11/13/2020 0928   RBC 4.93 07/19/2017 1535   HGB 14.1 11/13/2020 0928   HCT 42.1 11/13/2020 0928   PLT 221 11/13/2020 0928   MCV 91 11/13/2020 0928   MCH 30.6 11/13/2020 0928   MCH 29.3 07/19/2017 1535   MCHC 33.5 11/13/2020 0928   MCHC 32.7 07/19/2017 1535   RDW 13.9 11/13/2020 0928   LYMPHSABS 1.8 12/02/2017 0846   EOSABS 0.3 12/02/2017 0846   BASOSABS 0.1 12/02/2017 0846     Chest Imaging:  03/10/2021 chest x-ray: No acute abnormality. The patient's images have been independently reviewed by me.    Pulmonary Functions Testing Results: No flowsheet data found.  FeNO:   Pathology:   Echocardiogram:   Heart Catheterization:      Assessment & Plan:     ICD-10-CM   1. Stage 3 severe COPD by GOLD classification ([place])  J44.9     2. Former smoker  Z87.891       Discussion:  This is a 66 year old female with stage III COPD on previous office-based spirometry.  Currently on Flovent plus as needed albuterol.  She still has shortness of breath with exertion.  Has not had full PFTs in the past.  She is a former smoker quit in 2018 and candidate for lung cancer screening  Plan: Orders placed for referral to lung cancer screening We will change her maintenance inhaler regimen. Start Breztri, new samples, plus new prescription and co-pay card. Continue albuterol as needed for shortness of breath and wheezing.  Follow-up with [place] in 8 weeks to see how she is doing with her new inhaler regimen as well as repeat full PFTs.     Current Outpatient Medications:    albuterol (VENTOLIN HFA) 108 (90 Base) MCG/ACT inhaler, INHALE 2 PUFFS BY MOUTH INTO LUNGS EVERY 4 HOURS AS NEEDED FOR COUGH, WHEEZING, OR SHORTNESS OF BREATH, Disp: 18 g, Rfl: 5   Ascorbic Acid (VITAMIN C) 1000 MG tablet, Take 1,000 mg by mouth daily., Disp: , Rfl:    Biotin 10000 MCG TABS, Take 2 tablets by mouth daily., Disp: , Rfl:    Calcium 600-200 MG-UNIT tablet, Take 1 tablet by mouth daily., Disp: , Rfl:    cholecalciferol (VITAMIN D3) 25 MCG (1000 UT) tablet, Take 2,000 Units by mouth daily., Disp: , Rfl:  COLLAGEN PO, Take by mouth daily., Disp: , Rfl:    fluticasone (FLOVENT HFA) 110 MCG/ACT inhaler, INHALE 1 PUFF INTO THE LUNGS IN THE MORNING AND AT BEDTIME., Disp: 12 g, Rfl: 12   Lysine 1000 MG TABS, Take 1 tablet by mouth daily. Pt stated that she is only taken '500mg'$ ., Disp: , Rfl:    Multiple Vitamin (MULTIVITAMIN ADULT PO), Take 100 mg by mouth 1 day or 1 dose. Neuriva, Disp: , Rfl:    rosuvastatin (CRESTOR) 5 MG tablet, TAKE 1 TABLET (5 MG TOTAL) BY MOUTH DAILY., Disp: 90 tablet, Rfl: 3   SUPER B COMPLEX/C PO, Take 2 capsules by mouth daily.,  Disp: , Rfl:     Garner Nash, DO [facility] 04/16/2021 9:26 AM

## 2021-04-16 NOTE — Patient Instructions (Signed)
Thank you for visiting Dr. Valeta Harms at [facility]. ?Today we recommend the following: ? ?Orders Placed This Encounter  ?Procedures  ? Ambulatory Referral for Lung Cancer Scre  ? Pulmonary Function Test  ? ?Breztri samples, Breztri prescription and copay card  ? ?Return in about 2 months (around 06/16/2021) for with APP or Dr. Valeta Harms. ? ? ? ?Please do your part to reduce the spread of COVID-19.  ?

## 2021-04-16 NOTE — Addendum Note (Signed)
Addended by: Fran Lowes on: 04/16/2021 02:02 PM ? ? Modules accepted: Orders ? ?

## 2021-05-13 ENCOUNTER — Other Ambulatory Visit (HOSPITAL_COMMUNITY): Payer: Self-pay

## 2021-05-15 ENCOUNTER — Ambulatory Visit: Payer: 59 | Admitting: Nurse Practitioner

## 2021-05-15 ENCOUNTER — Encounter: Payer: Self-pay | Admitting: Nurse Practitioner

## 2021-05-15 VITALS — BP 124/62 | HR 75 | Temp 97.9°F | Ht 62.0 in | Wt 207.0 lb

## 2021-05-15 DIAGNOSIS — R5383 Other fatigue: Secondary | ICD-10-CM | POA: Diagnosis not present

## 2021-05-15 DIAGNOSIS — R7303 Prediabetes: Secondary | ICD-10-CM | POA: Diagnosis not present

## 2021-05-15 DIAGNOSIS — Z6837 Body mass index (BMI) 37.0-37.9, adult: Secondary | ICD-10-CM | POA: Diagnosis not present

## 2021-05-15 DIAGNOSIS — E6609 Other obesity due to excess calories: Secondary | ICD-10-CM | POA: Diagnosis not present

## 2021-05-15 DIAGNOSIS — E782 Mixed hyperlipidemia: Secondary | ICD-10-CM | POA: Diagnosis not present

## 2021-05-15 NOTE — Progress Notes (Addendum)
?Industrial/product designer as a Education administrator for [employer], FNP.,have documented all relevant documentation on the behalf of [name], FNP,as directed by  [name], FNP while in the presence of [name], [place]. ? ?This visit occurred during the SARS-CoV-2 public health emergency.  Safety protocols were in place, including screening questions prior to the visit, additional usage of staff PPE, and extensive cleaning of exam room while observing appropriate contact time as indicated for disinfecting solutions. ? ?Subjective:  ?  ? Patient ID: Gabrielle Castro , female    DOB: 1956-01-17 , 66 y.o.   MRN: [id number] ? ? ?Chief Complaint  ?Patient presents with  ? Prediabetes  ? ? ?HPI ? ?Patient presents today for prediabetes and cholesterol follow up.  She has her cologuard and has 5 days to send it in. She has to call and schedule with [facility] ? ?Wt Readings from Last 3 Encounters: ?05/15/21 : 207 lb (93.9 kg) ?04/16/21 : 207 lb (93.9 kg) ?03/10/21 : 201 lb 6.4 oz (91.4 kg) ? ? ?  ? ?Past Medical History:  ?Diagnosis Date  ? Anxiety   ? COPD (chronic obstructive pulmonary disease) ([place])   ? Dyspnea   ? EP (ectopic pregnancy)   ? GERD (gastroesophageal reflux disease)   ? Sore throat 02/15/2019  ?  ? ?Family History  ?Problem Relation Age of Onset  ? Lung cancer Father   ?     smoked  ? Cancer Father   ?     lung; +TOB  ? Cancer Paternal Grandmother   ?     "every kind of cancer"  ? Heart disease Mother   ? Diabetes Mother   ? Hypertension Mother   ? Hyperlipidemia Mother   ? ? ? ?Current Outpatient Medications:  ?  albuterol (VENTOLIN HFA) 108 (90 Base) MCG/ACT inhaler, INHALE 2 PUFFS BY MOUTH INTO LUNGS EVERY 4 HOURS AS NEEDED FOR COUGH, WHEEZING, OR SHORTNESS OF BREATH, Disp: 18 g, Rfl: 5 ?  Ascorbic Acid (VITAMIN C) 1000 MG tablet, Take 1,000 mg by mouth daily., Disp: , Rfl:  ?  Biotin 10000 MCG TABS, Take 2 tablets by mouth daily., Disp: , Rfl:  ?  Budeson-Glycopyrrol-Formoterol (BREZTRI AEROSPHERE)  160-9-4.8 MCG/ACT AERO, Inhale 2 puffs into the lungs in the morning and at bedtime., Disp: 10.7 g, Rfl: 0 ?  Budeson-Glycopyrrol-Formoterol (BREZTRI AEROSPHERE) 160-9-4.8 MCG/ACT AERO, Inhale 2 puffs into the lungs in the morning and at bedtime., Disp: 10.7 g, Rfl: 0 ?  Calcium 600-200 MG-UNIT tablet, Take 1 tablet by mouth daily., Disp: , Rfl:  ?  cholecalciferol (VITAMIN D3) 25 MCG (1000 UT) tablet, Take 2,000 Units by mouth daily., Disp: , Rfl:  ?  COLLAGEN PO, Take by mouth daily., Disp: , Rfl:  ?  Lysine 1000 MG TABS, Take 1 tablet by mouth daily. Pt stated that she is only taken 577m., Disp: , Rfl:  ?  rosuvastatin (CRESTOR) 5 MG tablet, TAKE 1 TABLET (5 MG TOTAL) BY MOUTH DAILY., Disp: 90 tablet, Rfl: 3 ?  SUPER B COMPLEX/C PO, Take 2 capsules by mouth daily., Disp: , Rfl:  ?  Multiple Vitamin (MULTIVITAMIN ADULT PO), Take 100 mg by mouth 1 day or 1 dose. Neuriva, Disp: , Rfl:   ? ?Allergies  ?Allergen Reactions  ? Nickel Swelling, Hives and Itching  ? Tape Rash  ? Other Itching and Swelling  ?  ? ?Review of Systems  ?Constitutional: Negative.   ?Respiratory: Negative.    ?Cardiovascular: Negative.  Negative  for chest pain, palpitations and leg swelling.  ?Gastrointestinal: Negative.   ?Neurological: Negative.   ?Psychiatric/Behavioral: Negative.     ? ?Today's Vitals  ? 05/15/21 0825  ?BP: 124/62  ?Pulse: 75  ?Temp: 97.9 ?F (36.6 ?C)  ?TempSrc: Oral  ?Weight: 207 lb (93.9 kg)  ?Height: '5\' 2"'  (1.575 m)  ? ?Body mass index is 37.86 kg/m?.  ?BP Readings from Last 3 Encounters:  ?05/15/21 124/62  ?04/16/21 (!) 150/82  ?03/10/21 126/67  ? ? ?Objective:  ?Physical Exam ?Vitals reviewed.  ?Constitutional:   ?   General: She is not in acute distress. ?   Appearance: Normal appearance. She is obese.  ?Cardiovascular:  ?   Rate and Rhythm: Normal rate and regular rhythm.  ?   Pulses: Normal pulses.  ?   Heart sounds: Normal heart sounds. No murmur heard. ?Pulmonary:  ?   Effort: Pulmonary effort is normal. No  respiratory distress.  ?   Breath sounds: Normal breath sounds. No wheezing.  ?Neurological:  ?   General: No focal deficit present.  ?   Mental Status: She is alert and oriented to person, place, and time.  ?   Cranial Nerves: No cranial nerve deficit.  ?   Motor: No weakness.  ?Psychiatric:     ?   Mood and Affect: Mood normal.     ?   Behavior: Behavior normal.     ?   Thought Content: Thought content normal.     ?   Judgment: Judgment normal.  ?  ? ?   ?Assessment And Plan:  ?   ?1. Mixed hyperlipidemia ?Comments: Stable, encouraged to take a fiber supplement.  ?- Lipid panel ? ?2. Pre-diabetes ?Slightly elevated HgbA1c at last visit, continue to focus on a diet low in sugar and carbohydrates.  ?Continue to exercise regularly ?- Hemoglobin A1c ?- BMP8+EGFR ? ? ?3. Other fatigue ?Will check vitamin B12, if normal will refer for sleep study ?- Vitamin B12 ? ?3. Class 2 obesity due to excess calories without serious comorbidity with body mass index (BMI) of 37.0 to 37.9 in adult ?She is encouraged to strive for BMI less than 30 to decrease cardiac risk. Advised to aim for at least 150 minutes of exercise per week. ? ? ? ?Patient was given opportunity to ask questions. Patient verbalized understanding of the plan and was able to repeat key elements of the plan. All questions were answered to their satisfaction.  ?[name], FNP  ? ?I, [name], FNP, have reviewed all documentation for this visit. The documentation on 05/15/21 for the exam, diagnosis, procedures, and orders are all accurate and complete.  ? ?IF YOU HAVE BEEN REFERRED TO A SPECIALIST, IT MAY TAKE 1-2 WEEKS TO SCHEDULE/PROCESS THE REFERRAL. IF YOU HAVE NOT HEARD FROM US/SPECIALIST IN TWO WEEKS, PLEASE GIVE [place] A CALL AT [phone].  ? ?THE PATIENT IS ENCOURAGED TO PRACTICE SOCIAL DISTANCING DUE TO THE COVID-19 PANDEMIC.   ?

## 2021-05-15 NOTE — Patient Instructions (Signed)
Preventing Type 2 Diabetes Mellitus °Type 2 diabetes, also called type 2 diabetes mellitus, is a long-term (chronic) disease that affects sugar (glucose) levels in your blood. Normally, a hormone called insulin allows glucose to enter cells in your body. The cells use glucose for energy. With type 2 diabetes, you will have one or both of these problems: °Your pancreas does not make enough insulin. °Cells in your body do not respond properly to insulin that your body makes (insulin resistance). °Insulin resistance or lack of insulin causes extra glucose to build up in the blood instead of going into cells. As a result, high blood glucose (hyperglycemia) develops. That can cause many complications. Being overweight or obese and having an inactive (sedentary) lifestyle can increase your risk for diabetes. Type 2 diabetes can be delayed or prevented by making certain nutrition and lifestyle changes. °How can this condition affect me? °If you do not take steps to prevent diabetes, your blood glucose levels may keep increasing over time. Too much glucose in your blood for a long time can damage your blood vessels, heart, kidneys, nerves, and eyes. °Type 2 diabetes can lead to chronic health problems and complications, such as: °Heart disease. °Stroke. °Blindness. °Kidney disease. °Depression. °Poor circulation in your feet and legs. In severe cases, a foot or leg may need to be surgically removed (amputated). °What can increase my risk? °You may be more likely to develop type 2 diabetes if you: °Have type 2 diabetes in your family. °Are overweight or obese. °Have a sedentary lifestyle. °Have insulin resistance or a history of prediabetes. °Have a history of pregnancy-related (gestational) diabetes or polycystic ovary syndrome (PCOS). °What actions can I take to prevent this? °It can be difficult to recognize signs of type 2 diabetes. Taking action to prevent the disease before you develop symptoms is the best way to avoid  possible damage to your body. Making certain nutrition and lifestyle changes may prevent or delay the disease and related health problems. °Nutrition ° °Eat healthy meals and snacks regularly. Do not skip meals. Fruit or a handful of nuts is a healthy snack between meals. °Drink water throughout the day. Avoid drinks that contain added sugar, such as soda or sweetened tea. Drink enough fluid to keep your urine pale yellow. °Follow instructions from your health care provider about eating or drinking restrictions. °Limit the amount of food you eat by: °Managing how much you eat at a time (portion size). °Checking food labels for the serving sizes of food. °Using a kitchen scale to weigh amounts of food. °Sauté or steam food instead of frying it. Cook with water or broth instead of oils or butter. °Limit saturated fat and salt (sodium) in your diet. Have no more than 1 tsp (2,400 mg) of sodium a day. If you have heart disease or high blood pressure, use less than ½?¾ tsp (1,500 mg) of sodium a day. °Lifestyle ° °Lose weight if needed and as told. Your health care provider can determine how much weight loss is best for you and can help you lose weight safely. °If you are overweight or obese, you may be told to lose at least 5?7% of your body weight. °Manage blood pressure, cholesterol, and stress. Your health care provider will help determine the best treatment for you. °Do not use any products that contain nicotine or tobacco. These products include cigarettes, chewing tobacco, and vaping devices, such as e-cigarettes. If you need help quitting, ask your health care provider. °Activity ° °Do physical   activity that makes your heart beat faster and makes you sweat (moderate intensity). Do this for at least 30 minutes on at least 5 days of the week, or as much as told by your health care provider. °Ask your health care provider what activities are safe for you. A mix of activities may be best, such as walking, swimming,  cycling, and strength training. °Try to add physical activity into your day. For example: °Park your car farther away than usual so that you walk more. °Take a walk during your lunch break. °Use stairs instead of elevators or escalators. °Walk or bike to work instead of driving. °Alcohol use °If you drink alcohol: °Limit how much you have to: °0?1 drink a day for women who are not pregnant. °0?2 drinks a day for men. °Know how much alcohol is in your drink. In the [place] one drink equals one 12 oz bottle of beer (355 mL), one 5 oz glass of wine (148 mL), or one 1½ oz glass of hard liquor (44 mL). °General information °Talk with your health care provider about your risk factors and how you can reduce your risk for diabetes. °Have your blood glucose tested regularly, as told by your health care provider. °Get screening tests as told by your health care provider. You may have these regularly, especially if you have certain risk factors for type 2 diabetes. °Make an appointment with a registered dietitian. This diet and nutrition specialist can help you make a healthy eating plan and help you understand portion sizes and food labels. °Where to find support °Ask your health care provider to recommend a registered dietitian, a certified diabetes care and education specialist, or a weight loss program. °Look for local or online weight loss groups. °Join a gym, fitness club, or outdoor activity group, such as a walking club. °Where to find more information °For help and guidance and to learn more about diabetes and diabetes prevention, visit: °American Diabetes Association (ADA): www.diabetes.org °National Institute of Diabetes and Digestive and Kidney Diseases: www.niddk.nih.gov °To learn more about healthy eating, visit: °[place] Department of Agriculture (USDA): www.choosemyplate.gov °Office of Disease Prevention and Health Promotion (ODPHP): health.gov °Summary °You can delay or prevent type 2 diabetes by eating healthy  foods, losing weight if needed, and increasing your physical activity. °Talk with your health care provider about your risk factors for type 2 diabetes and how you can reduce your risk. °It can be difficult to recognize the signs of type 2 diabetes. The best way to avoid possible damage to your body is to take action to prevent the disease before you develop symptoms. °Get screening tests as told by your health care provider. °This information is not intended to replace advice given to you by your health care provider. Make sure you discuss any questions you have with your health care provider. °Document Revised: 04/22/2020 Document Reviewed: 04/22/2020 °Elsevier Patient Education © 2022 Elsevier Inc. ° °

## 2021-05-16 LAB — BMP8+EGFR
BUN/Creatinine Ratio: 18 (ref 12–28)
BUN: 20 mg/dL (ref 8–27)
CO2: 22 mmol/L (ref 20–29)
Calcium: 9.9 mg/dL (ref 8.7–10.3)
Chloride: 107 mmol/L — ABNORMAL HIGH (ref 96–106)
Creatinine, Ser: 1.09 mg/dL — ABNORMAL HIGH (ref 0.57–1.00)
Glucose: 88 mg/dL (ref 70–99)
Potassium: 4.8 mmol/L (ref 3.5–5.2)
Sodium: 144 mmol/L (ref 134–144)
eGFR: 57 mL/min/{1.73_m2} — ABNORMAL LOW (ref >59)

## 2021-05-16 LAB — LIPID PANEL
Chol/HDL Ratio: 1.8 ratio (ref 0.0–4.4)
Cholesterol, Total: 175 mg/dL (ref 100–199)
HDL: 95 mg/dL (ref >39)
LDL Chol Calc (NIH): 67 mg/dL (ref 0–99)
Triglycerides: 72 mg/dL (ref 0–149)
VLDL Cholesterol Cal: 13 mg/dL (ref 5–40)

## 2021-05-16 LAB — HEMOGLOBIN A1C
Est. average glucose Bld gHb Est-mCnc: 117 mg/dL
Hgb A1c MFr Bld: 5.7 % — ABNORMAL HIGH (ref 4.8–5.6)

## 2021-05-16 LAB — VITAMIN B12: Vitamin B-12: 846 pg/mL (ref 232–1245)

## 2021-05-19 ENCOUNTER — Encounter: Payer: Self-pay | Admitting: Pulmonary Disease

## 2021-05-20 ENCOUNTER — Other Ambulatory Visit: Payer: Self-pay

## 2021-05-20 ENCOUNTER — Other Ambulatory Visit (HOSPITAL_COMMUNITY): Payer: Self-pay

## 2021-05-20 ENCOUNTER — Encounter: Payer: Self-pay | Admitting: Nurse Practitioner

## 2021-05-21 ENCOUNTER — Other Ambulatory Visit (HOSPITAL_COMMUNITY): Payer: Self-pay

## 2021-05-21 ENCOUNTER — Encounter: Payer: Self-pay | Admitting: Nurse Practitioner

## 2021-05-21 ENCOUNTER — Ambulatory Visit: Payer: 59 | Admitting: Nurse Practitioner

## 2021-05-21 VITALS — BP 132/80 | HR 79 | Temp 98.1°F | Ht 62.0 in | Wt 209.0 lb

## 2021-05-21 DIAGNOSIS — R051 Acute cough: Secondary | ICD-10-CM | POA: Diagnosis not present

## 2021-05-21 DIAGNOSIS — J029 Acute pharyngitis, unspecified: Secondary | ICD-10-CM

## 2021-05-21 DIAGNOSIS — R0981 Nasal congestion: Secondary | ICD-10-CM

## 2021-05-21 LAB — POCT RAPID STREP A (OFFICE): Rapid Strep A Screen: NEGATIVE

## 2021-05-21 LAB — POC COVID19 BINAXNOW: SARS Coronavirus 2 Ag: NEGATIVE

## 2021-05-21 MED ORDER — MOMETASONE FUROATE 50 MCG/ACT NA SUSP
2.0000 | Freq: Every day | NASAL | 2 refills | Status: DC
Start: 2021-05-21 — End: 2021-11-20
  Filled 2021-05-21: qty 17, 30d supply, fill #0

## 2021-05-21 NOTE — Patient Instructions (Signed)

## 2021-05-21 NOTE — Progress Notes (Signed)
?Industrial/product designer as a Education administrator for [employer], FNP.,have documented all relevant documentation on the behalf of [name], FNP,as directed by  [name], FNP while in the presence of [name], [place]. ? ?This visit occurred during the SARS-CoV-2 public health emergency.  Safety protocols were in place, including screening questions prior to the visit, additional usage of staff PPE, and extensive cleaning of exam room while observing appropriate contact time as indicated for disinfecting solutions. ? ?Subjective:  ?  ? Patient ID: Gabrielle Castro , female    DOB: 12-Jul-1955 , 66 y.o.   MRN: [id number] ? ? ?Chief Complaint  ?Patient presents with  ? Cough  ? ? ?HPI ? ?Patient presents today for cough and sore throat. Last Monday with light sore throat, 3-4 days. Wednesday or Thursday is getting worse. Feels swollen. She has a new maintenance inhaler she thinks is causing her a cough (dry).  She does not use any nasal sprays. She has been taking claritin daily.  ? ?Cough ?This is a new problem. Associated symptoms include nasal congestion and a sore throat. Pertinent negatives include no chills, fever, postnasal drip, rash, shortness of breath or weight loss. There is no history of asthma.   ? ?Past Medical History:  ?Diagnosis Date  ? Anxiety   ? COPD (chronic obstructive pulmonary disease) ([place])   ? Dyspnea   ? EP (ectopic pregnancy)   ? GERD (gastroesophageal reflux disease)   ? Sore throat 02/15/2019  ?  ? ?Family History  ?Problem Relation Age of Onset  ? Lung cancer Father   ?     smoked  ? Cancer Father   ?     lung; +TOB  ? Cancer Paternal Grandmother   ?     "every kind of cancer"  ? Heart disease Mother   ? Diabetes Mother   ? Hypertension Mother   ? Hyperlipidemia Mother   ? ? ? ?Current Outpatient Medications:  ?  mometasone (NASONEX) 50 MCG/ACT nasal spray, Place 2 sprays into the nose daily., Disp: 1 each, Rfl: 2 ?  albuterol (VENTOLIN HFA) 108 (90 Base) MCG/ACT inhaler, INHALE 2 PUFFS BY  MOUTH INTO LUNGS EVERY 4 HOURS AS NEEDED FOR COUGH, WHEEZING, OR SHORTNESS OF BREATH, Disp: 18 g, Rfl: 5 ?  Ascorbic Acid (VITAMIN C) 1000 MG tablet, Take 1,000 mg by mouth daily., Disp: , Rfl:  ?  Biotin 10000 MCG TABS, Take 2 tablets by mouth daily., Disp: , Rfl:  ?  Budeson-Glycopyrrol-Formoterol (BREZTRI AEROSPHERE) 160-9-4.8 MCG/ACT AERO, Inhale 2 puffs into the lungs in the morning and at bedtime., Disp: 10.7 g, Rfl: 0 ?  Budeson-Glycopyrrol-Formoterol (BREZTRI AEROSPHERE) 160-9-4.8 MCG/ACT AERO, Inhale 2 puffs into the lungs in the morning and at bedtime., Disp: 10.7 g, Rfl: 0 ?  Calcium 600-200 MG-UNIT tablet, Take 1 tablet by mouth daily., Disp: , Rfl:  ?  cholecalciferol (VITAMIN D3) 25 MCG (1000 UT) tablet, Take 2,000 Units by mouth daily., Disp: , Rfl:  ?  COLLAGEN PO, Take by mouth daily., Disp: , Rfl:  ?  Lysine 1000 MG TABS, Take 1 tablet by mouth daily. Pt stated that she is only taken '500mg'$ ., Disp: , Rfl:  ?  Multiple Vitamin (MULTIVITAMIN ADULT PO), Take 100 mg by mouth 1 day or 1 dose. Neuriva, Disp: , Rfl:  ?  rosuvastatin (CRESTOR) 5 MG tablet, TAKE 1 TABLET (5 MG TOTAL) BY MOUTH DAILY., Disp: 90 tablet, Rfl: 3 ?  SUPER B COMPLEX/C PO, Take 2 capsules by  mouth daily., Disp: , Rfl:   ? ?Allergies  ?Allergen Reactions  ? Nickel Swelling, Hives and Itching  ? Tape Rash  ? Other Itching and Swelling  ?  ? ?Review of Systems  ?Constitutional:  Negative for chills, fever and weight loss.  ?HENT:  Positive for sore throat. Negative for postnasal drip.   ?Respiratory:  Positive for cough. Negative for shortness of breath.   ?Skin:  Negative for rash.   ? ?Today's Vitals  ? 05/21/21 1541  ?BP: 132/80  ?Pulse: 79  ?Temp: 98.1 ?F (36.7 ?C)  ?TempSrc: Oral  ?Weight: 209 lb (94.8 kg)  ?Height: '5\' 2"'$  (1.575 m)  ? ?Body mass index is 38.23 kg/m?.  ? ?Objective:  ?Physical Exam ?Vitals reviewed.  ?Constitutional:   ?   General: She is not in acute distress. ?   Appearance: Normal appearance. She is obese.   ?HENT:  ?   Mouth/Throat:  ?   Mouth: Mucous membranes are moist.  ?   Comments: Oropharynx erythematous, tonsils are normal ?Cardiovascular:  ?   Rate and Rhythm: Normal rate and regular rhythm.  ?   Pulses: Normal pulses.  ?   Heart sounds: Normal heart sounds. No murmur heard. ?Pulmonary:  ?   Effort: Pulmonary effort is normal. No respiratory distress.  ?   Breath sounds: Normal breath sounds. No wheezing.  ?Skin: ?   General: Skin is warm and dry.  ?   Coloration: Skin is not jaundiced.  ?Neurological:  ?   General: No focal deficit present.  ?   Mental Status: She is alert and oriented to person, place, and time.  ?   Cranial Nerves: No cranial nerve deficit.  ?   Motor: No weakness.  ?Psychiatric:     ?   Mood and Affect: Mood normal.     ?   Behavior: Behavior normal.     ?   Thought Content: Thought content normal.     ?   Judgment: Judgment normal.  ?  ? ?   ?Assessment And Plan:  ?   ?1. Acute cough ?Comments: Negative rapid covid, Influenza A/B ?- POC COVID-19 ?- mometasone (NASONEX) 50 MCG/ACT nasal spray; Place 2 sprays into the nose daily.  Dispense: 1 each; Refill: 2 ? ?2. Sore throat ?Comments: Negative rapid strep, continue with warm salt water gargles. Mild erythema to oropharynx ?- POCT rapid strep A ?- mometasone (NASONEX) 50 MCG/ACT nasal spray; Place 2 sprays into the nose daily.  Dispense: 1 each; Refill: 2 ? ?3. Nasal congestion ?Comments: Will treat with norel ad at bedtime to see if effective and will send nasal spary ?  ? ? ?Patient was given opportunity to ask questions. Patient verbalized understanding of the plan and was able to repeat key elements of the plan. All questions were answered to their satisfaction.  ?[name], FNP  ? ?I, [name], FNP, have reviewed all documentation for this visit. The documentation on 05/21/21 for the exam, diagnosis, procedures, and orders are all accurate and complete.  ? ?IF YOU HAVE BEEN REFERRED TO A SPECIALIST, IT MAY TAKE 1-2 WEEKS TO  SCHEDULE/PROCESS THE REFERRAL. IF YOU HAVE NOT HEARD FROM US/SPECIALIST IN TWO WEEKS, PLEASE GIVE [place] A CALL AT [phone].  ? ?THE PATIENT IS ENCOURAGED TO PRACTICE SOCIAL DISTANCING DUE TO THE COVID-19 PANDEMIC.   ?

## 2021-05-28 ENCOUNTER — Encounter: Payer: Self-pay | Admitting: Nurse Practitioner

## 2021-05-28 ENCOUNTER — Other Ambulatory Visit (HOSPITAL_COMMUNITY): Payer: Self-pay

## 2021-05-28 ENCOUNTER — Other Ambulatory Visit: Payer: Self-pay

## 2021-05-28 MED ORDER — NOREL AD 4-10-325 MG PO TABS
ORAL_TABLET | ORAL | 1 refills | Status: DC
  Filled 2021-05-28: qty 30, 30d supply, fill #0
  Filled 2021-06-25: qty 30, 30d supply, fill #1

## 2021-05-29 ENCOUNTER — Other Ambulatory Visit (HOSPITAL_COMMUNITY): Payer: Self-pay

## 2021-05-30 ENCOUNTER — Other Ambulatory Visit: Payer: Self-pay

## 2021-05-30 ENCOUNTER — Other Ambulatory Visit (HOSPITAL_COMMUNITY): Payer: Self-pay

## 2021-05-31 ENCOUNTER — Other Ambulatory Visit (HOSPITAL_COMMUNITY): Payer: Self-pay

## 2021-05-31 ENCOUNTER — Other Ambulatory Visit: Payer: Self-pay | Admitting: Pulmonary Disease

## 2021-06-03 ENCOUNTER — Encounter: Payer: Self-pay | Admitting: Nurse Practitioner

## 2021-06-04 ENCOUNTER — Other Ambulatory Visit (HOSPITAL_COMMUNITY): Payer: Self-pay

## 2021-06-04 MED ORDER — BREZTRI AEROSPHERE 160-9-4.8 MCG/ACT IN AERO
2.0000 | INHALATION_SPRAY | Freq: Two times a day (BID) | RESPIRATORY_TRACT | 11 refills | Status: DC
  Filled 2021-06-04: qty 10.7, 30d supply, fill #0

## 2021-06-09 ENCOUNTER — Other Ambulatory Visit: Payer: Self-pay

## 2021-06-09 DIAGNOSIS — Z122 Encounter for screening for malignant neoplasm of respiratory organs: Secondary | ICD-10-CM

## 2021-06-09 DIAGNOSIS — Z87891 Personal history of nicotine dependence: Secondary | ICD-10-CM

## 2021-06-18 ENCOUNTER — Encounter: Payer: Self-pay | Admitting: Pulmonary Disease

## 2021-06-18 ENCOUNTER — Ambulatory Visit: Payer: 59 | Admitting: Pulmonary Disease

## 2021-06-18 VITALS — BP 130/82 | HR 76 | Temp 98.1°F | Ht 60.0 in | Wt 211.0 lb

## 2021-06-18 DIAGNOSIS — Z87891 Personal history of nicotine dependence: Secondary | ICD-10-CM | POA: Diagnosis not present

## 2021-06-18 DIAGNOSIS — J449 Chronic obstructive pulmonary disease, unspecified: Secondary | ICD-10-CM

## 2021-06-18 DIAGNOSIS — Z122 Encounter for screening for malignant neoplasm of respiratory organs: Secondary | ICD-10-CM

## 2021-06-18 MED ORDER — STIOLTO RESPIMAT 2.5-2.5 MCG/ACT IN AERS: 2.0000 | INHALATION_SPRAY | Freq: Every day | RESPIRATORY_TRACT | 0 refills | Status: DC

## 2021-06-18 NOTE — Addendum Note (Signed)
Addended by: Fran Lowes on: 06/18/2021 10:14 AM ? ? Modules accepted: Orders ? ?

## 2021-06-18 NOTE — Patient Instructions (Signed)
Thank you for visiting Dr. Valeta Harms at [facility]. ?Today we recommend the following: ? ?Stiolto samples ? ?Return in about 1 year (around 06/19/2022), or if symptoms worsen or fail to improve, for with APP or Dr. Valeta Harms. ? ? ? ?Please do your part to reduce the spread of COVID-19.  ? ?

## 2021-06-18 NOTE — Progress Notes (Signed)
? ?Synopsis: Referred in March 2023 for COPD by [name], FNP ? ?Subjective:  ? ?PATIENT ID: Gabrielle Castro GENDER: female DOB: 03/08/1955, MRN: [id number] ? ?Chief Complaint  ?Patient presents with  ? Follow-up  ?  Follow up. Patient says her breathing is the same.   ? ? ?This is a 66 year old female, past medical history of anxiety, COPD, GERD.  Here today for COPD evaluation.  Patient is a former smoker 41-pack-year history quit in 2018.  Father has a history of lung cancer.Patient has not had any axial CT imaging of the chest.  Patient had office-based spirometry and February 2018 which revealed a ratio of 70 and an FEV1 of 49% predicted consistent with severe obstructive defect.  Patient is able to walk daily.  She does have some shortness of breath with exertion.  Overall no significant complaints.  She is able to complete most of her activities of daily living.  She is currently managed with Flovent does not really like her daily maintenance inhaler. ? ?OV 06/18/2021: Doing well today.  She used Librarian, academic for a short period.  She stopped using it as he felt like it was changing her voice some.  Went back to using her Flovent.  She still uses as needed albuterol.  Enrolled in our lung cancer screening program with plans for scanning later on this month.  She is interested in potentially having samples of the new inhaler if possible. ? ? ? ?Past Medical History:  ?Diagnosis Date  ? Anxiety   ? COPD (chronic obstructive pulmonary disease) ([place])   ? Dyspnea   ? EP (ectopic pregnancy)   ? GERD (gastroesophageal reflux disease)   ? Sore throat 02/15/2019  ?  ? ?Family History  ?Problem Relation Age of Onset  ? Lung cancer Father   ?     smoked  ? Cancer Father   ?     lung; +TOB  ? Cancer Paternal Grandmother   ?     "every kind of cancer"  ? Heart disease Mother   ? Diabetes Mother   ? Hypertension Mother   ? Hyperlipidemia Mother   ?  ? ?Past Surgical History:  ?Procedure Laterality Date  ? BREAST EXCISIONAL  BIOPSY Right 10+ years ago  ? benign  ? BREAST SURGERY    ? EYE SURGERY    ? HAND SURGERY    ? KIDNEY DONATION    ? KIDNEY DONATION    ? TUBAL LIGATION    ? ? ?Social History  ? ?Socioeconomic History  ? Marital status: Married  ?  Spouse name: Marrianne Sica  ? Number of children: 4  ? Years of education: Not on file  ? Highest education level: Associate degree: academic program  ?Occupational History  ? Occupation: Accounts payable  ?  Employer: [employer]  ?Tobacco Use  ? Smoking status: Former  ?  Packs/day: 1.00  ?  Years: 41.00  ?  Pack years: 41.00  ?  Types: Cigarettes  ?  Quit date: 03/03/2016  ?  Years since quitting: 5.2  ? Smokeless tobacco: Never  ?Substance and Sexual Activity  ? Alcohol use: Yes  ? Drug use: Never  ? Sexual activity: Not Currently  ?  Comment: since husband's stroke in 2014  ?Other Topics Concern  ? Not on file  ?Social History Narrative  ? Lives with her husband, who is disabled due to multiple cardiovascular diagnoses, and her elderly mother.  ? Her mother is a "pack rat."  Her husband doesn't like clutter, and makes snide comments about it.  ? One daughter lives [place].  ? Other 2 daughters and son live near [place], [place].   ? ?Social Determinants of Health  ? ?Financial Resource Strain: Not on file  ?Food Insecurity: Not on file  ?Transportation Needs: Not on file  ?Physical Activity: Not on file  ?Stress: Not on file  ?Social Connections: Not on file  ?Intimate Partner Violence: Not on file  ?  ? ?Allergies  ?Allergen Reactions  ? Nickel Swelling, Hives and Itching  ? Tape Rash  ? Other Itching and Swelling  ?  ? ?Outpatient Medications Prior to Visit  ?Medication Sig Dispense Refill  ? albuterol (VENTOLIN HFA) 108 (90 Base) MCG/ACT inhaler INHALE 2 PUFFS BY MOUTH INTO LUNGS EVERY 4 HOURS AS NEEDED FOR COUGH, WHEEZING, OR SHORTNESS OF BREATH 18 g 5  ? Ascorbic Acid (VITAMIN C) 1000 MG tablet Take 1,000 mg by mouth daily.    ? Biotin 10000 MCG TABS Take 2 tablets by mouth daily.    ?  Calcium 600-200 MG-UNIT tablet Take 1 tablet by mouth daily.    ? Chlorphen-PE-Acetaminophen (NOREL AD) 4-10-325 MG TABS Take one tablet by mouth daily. 90 tablet 1  ? cholecalciferol (VITAMIN D3) 25 MCG (1000 UT) tablet Take 2,000 Units by mouth daily.    ? COLLAGEN PO Take by mouth daily.    ? Lysine 1000 MG TABS Take 1 tablet by mouth daily. Pt stated that she is only taken '500mg'$ .    ? mometasone (NASONEX) 50 MCG/ACT nasal spray Place 2 sprays into the nose daily. 17 g 2  ? Multiple Vitamin (MULTIVITAMIN ADULT PO) Take 100 mg by mouth 1 day or 1 dose. Neuriva    ? rosuvastatin (CRESTOR) 5 MG tablet TAKE 1 TABLET (5 MG TOTAL) BY MOUTH DAILY. 90 tablet 3  ? SUPER B COMPLEX/C PO Take 2 capsules by mouth daily.    ? Budeson-Glycopyrrol-Formoterol (BREZTRI AEROSPHERE) 160-9-4.8 MCG/ACT AERO Inhale 2 puffs into the lungs in the morning and at bedtime. (Patient not taking: Reported on 06/18/2021) 10.7 g 0  ? Budeson-Glycopyrrol-Formoterol (BREZTRI AEROSPHERE) 160-9-4.8 MCG/ACT AERO Inhale 2 puffs into the lungs in the morning and at bedtime. (Patient not taking: Reported on 06/18/2021) 10.7 g 11  ? ?No facility-administered medications prior to visit.  ? ? ?Review of Systems  ?Constitutional:  Negative for chills, fever, malaise/fatigue and weight loss.  ?HENT:  Negative for hearing loss, sore throat and tinnitus.   ?Eyes:  Negative for blurred vision and double vision.  ?Respiratory:  Positive for shortness of breath. Negative for cough, hemoptysis, sputum production, wheezing and stridor.   ?Cardiovascular:  Negative for chest pain, palpitations, orthopnea, leg swelling and PND.  ?Gastrointestinal:  Negative for abdominal pain, constipation, diarrhea, heartburn, nausea and vomiting.  ?Genitourinary:  Negative for dysuria, hematuria and urgency.  ?Musculoskeletal:  Negative for joint pain and myalgias.  ?Skin:  Negative for itching and rash.  ?Neurological:  Negative for dizziness, tingling, weakness and headaches.   ?Endo/Heme/Allergies:  Negative for environmental allergies. Does not bruise/bleed easily.  ?Psychiatric/Behavioral:  Negative for depression. The patient is not nervous/anxious and does not have insomnia.   ?All other systems reviewed and are negative. ? ? ?Objective:  ?Physical Exam ?Vitals reviewed.  ?Constitutional:   ?   General: She is not in acute distress. ?   Appearance: She is well-developed. She is obese.  ?HENT:  ?   Head: Normocephalic and atraumatic.  ?Eyes:  ?  General: No scleral icterus. ?   Conjunctiva/sclera: Conjunctivae normal.  ?   Pupils: Pupils are equal, round, and reactive to light.  ?Neck:  ?   Vascular: No JVD.  ?   Trachea: No tracheal deviation.  ?Cardiovascular:  ?   Rate and Rhythm: Normal rate and regular rhythm.  ?   Heart sounds: Normal heart sounds. No murmur heard. ?Pulmonary:  ?   Effort: Pulmonary effort is normal. No tachypnea, accessory muscle usage or respiratory distress.  ?   Breath sounds: No stridor. No wheezing, rhonchi or rales.  ?Abdominal:  ?   General: There is no distension.  ?   Palpations: Abdomen is soft.  ?   Tenderness: There is no abdominal tenderness.  ?Musculoskeletal:     ?   General: No tenderness.  ?   Cervical back: Neck supple.  ?Lymphadenopathy:  ?   Cervical: No cervical adenopathy.  ?Skin: ?   General: Skin is warm and dry.  ?   Capillary Refill: Capillary refill takes less than 2 seconds.  ?   Findings: No rash.  ?Neurological:  ?   Mental Status: She is alert and oriented to person, place, and time.  ?Psychiatric:     ?   Behavior: Behavior normal.  ? ? ? ?Vitals:  ? 06/18/21 0849  ?BP: 130/82  ?Pulse: 76  ?Temp: 98.1 ?F (36.7 ?C)  ?TempSrc: Oral  ?SpO2: 99%  ?Weight: 211 lb (95.7 kg)  ?Height: 5' (1.524 m)  ? ?99% on RA ?BMI Readings from Last 3 Encounters:  ?06/18/21 41.21 kg/m?  ?05/21/21 38.23 kg/m?  ?05/15/21 37.86 kg/m?  ? ?Wt Readings from Last 3 Encounters:  ?06/18/21 211 lb (95.7 kg)  ?05/21/21 209 lb (94.8 kg)  ?05/15/21 207 lb (93.9  kg)  ? ? ? ?CBC ?   ?Component Value Date/Time  ? WBC 6.9 11/13/2020 0928  ? WBC 8.5 07/19/2017 1535  ? RBC 4.61 11/13/2020 0928  ? RBC 4.93 07/19/2017 1535  ? HGB 14.1 11/13/2020 0928  ? HCT 42.1 11/13/2020

## 2021-06-24 ENCOUNTER — Other Ambulatory Visit (HOSPITAL_COMMUNITY): Payer: Self-pay

## 2021-06-24 ENCOUNTER — Other Ambulatory Visit: Payer: Self-pay | Admitting: Pulmonary Disease

## 2021-06-24 MED ORDER — STIOLTO RESPIMAT 2.5-2.5 MCG/ACT IN AERS
2.0000 | INHALATION_SPRAY | Freq: Every day | RESPIRATORY_TRACT | 5 refills | Status: DC
  Filled 2021-06-24: qty 4, 30d supply, fill #0
  Filled 2021-07-23: qty 4, 30d supply, fill #1
  Filled 2021-08-19: qty 4, 30d supply, fill #2
  Filled 2021-09-17: qty 4, 30d supply, fill #3
  Filled 2021-10-28: qty 4, 30d supply, fill #4
  Filled 2021-11-24: qty 4, 30d supply, fill #5

## 2021-06-25 ENCOUNTER — Other Ambulatory Visit (HOSPITAL_COMMUNITY): Payer: Self-pay

## 2021-06-26 ENCOUNTER — Other Ambulatory Visit (HOSPITAL_COMMUNITY): Payer: Self-pay

## 2021-06-26 ENCOUNTER — Other Ambulatory Visit: Payer: Self-pay | Admitting: Nurse Practitioner

## 2021-06-26 MED ORDER — AZITHROMYCIN 250 MG PO TABS
ORAL_TABLET | ORAL | 0 refills | Status: AC
  Filled 2021-06-26: qty 6, 5d supply, fill #0

## 2021-06-27 ENCOUNTER — Ambulatory Visit (INDEPENDENT_AMBULATORY_CARE_PROVIDER_SITE_OTHER): Payer: 59 | Admitting: Acute Care

## 2021-06-27 ENCOUNTER — Encounter: Payer: Self-pay | Admitting: Acute Care

## 2021-06-27 DIAGNOSIS — Z87891 Personal history of nicotine dependence: Secondary | ICD-10-CM | POA: Diagnosis not present

## 2021-06-27 NOTE — Patient Instructions (Signed)
Thank you for participating in the [facility]. It was our pleasure to meet you today. We will call you with the results of your scan within the next few days. Your scan will be assigned a Lung RADS category score by the physicians reading the scans.  This Lung RADS score determines follow up scanning.  See below for description of categories, and follow up screening recommendations. We will be in touch to schedule your follow up screening annually or based on recommendations of our providers. We will fax a copy of your scan results to your Primary Care Physician, or the physician who referred you to the program, to ensure they have the results. Please call the office if you have any questions or concerns regarding your scanning experience or results.  Our office number is [phone]. Please speak with Denise Phelps, RN. , or  Denise Buckner RN, They are  our Lung Cancer Screening RN.'s If They are unavailable when you call, Please leave a message on the voice mail. We will return your call at our earliest convenience.This voice mail is monitored several times a day.  Remember, if your scan is normal, we will scan you annually as long as you continue to meet the criteria for the program. (Age 55-77, Current smoker or smoker who has quit within the last 15 years). If you are a smoker, remember, quitting is the single most powerful action that you can take to decrease your risk of lung cancer and other pulmonary, breathing related problems. We know quitting is hard, and we are here to help.  Please let us know if there is anything we can do to help you meet your goal of quitting. If you are a former smoker, congratulations. We are proud of you! Remain smoke free! Remember you can refer friends or family members through the number above.  We will screen them to make sure they meet criteria for the program. Thank you for helping us take better care of you by  participating in Lung Screening.  You can receive free nicotine replacement therapy ( patches, gum or mints) by calling 1-800-QUIT NOW. Please call so we can get you on the path to becoming  a non-smoker. I know it is hard, but you can do this!  Lung RADS Categories:  Lung RADS 1: no nodules or definitely non-concerning nodules.  Recommendation is for a repeat annual scan in 12 months.  Lung RADS 2:  nodules that are non-concerning in appearance and behavior with a very low likelihood of becoming an active cancer. Recommendation is for a repeat annual scan in 12 months.  Lung RADS 3: nodules that are probably non-concerning , includes nodules with a low likelihood of becoming an active cancer.  Recommendation is for a 6-month repeat screening scan. Often noted after an upper respiratory illness. We will be in touch to make sure you have no questions, and to schedule your 6-month scan.  Lung RADS 4 A: nodules with concerning findings, recommendation is most often for a follow up scan in 3 months or additional testing based on our provider's assessment of the scan. We will be in touch to make sure you have no questions and to schedule the recommended 3 month follow up scan.  Lung RADS 4 B:  indicates findings that are concerning. We will be in touch with you to schedule additional diagnostic testing based on our provider's  assessment of the scan.  Other options for assistance in smoking cessation (   As covered by your insurance benefits)  Hypnosis for smoking cessation  [employer] [phone]  Acupuncture for smoking cessation  [facility] [phone]   

## 2021-06-27 NOTE — Progress Notes (Addendum)
Virtual Visit via Telephone Note  I connected with Gabrielle Castro on 12/24/20 at  2:00 PM EST by telephone and verified that I am speaking with the correct person using two identifiers.  Location: Patient: Home Provider: working from home   I discussed the limitations, risks, security and privacy concerns of performing an evaluation and management service by telephone and the availability of in person appointments. I also discussed with the patient that there may be a patient responsible charge related to this service. The patient expressed understanding and agreed to proceed.  Shared Decision Making Visit Lung Cancer Screening Program [phone])   Eligibility: Age 66 y.o. Pack Years Smoking History Calculation 40 (# packs/per year x # years smoked) Recent History of coughing up blood  no Unexplained weight loss? no ( >Than 15 pounds within the last 6 months ) Prior History Lung / other cancer no (Diagnosis within the last 5 years already requiring surveillance chest CT Scans). Smoking Status Former Smoker Former Smokers: Years since quit: 5 years  Quit Date: 2018  Visit Components: Discussion included one or more decision making aids. yes Discussion included risk/benefits of screening. yes Discussion included potential follow up diagnostic testing for abnormal scans. yes Discussion included meaning and risk of over diagnosis. yes Discussion included meaning and risk of False Positives. yes Discussion included meaning of total radiation exposure. yes  Counseling Included: Importance of adherence to annual lung cancer LDCT screening. yes Impact of comorbidities on ability to participate in the program. yes Ability and willingness to under diagnostic treatment. yes  Smoking Cessation Counseling: Current Smokers:  Discussed importance of smoking cessation. yes Information about tobacco cessation classes and interventions provided to patient. yes Patient provided with "ticket"  for LDCT Scan. yes Symptomatic Patient. no  Counseling NA Diagnosis Code: Tobacco Use Z72.0 Asymptomatic Patient yes  Counseling NA Former Smokers:  Discussed the importance of maintaining cigarette abstinence. yes Diagnosis Code: Personal History of Nicotine Dependence. I94.854 Information about tobacco cessation classes and interventions provided to patient. Yes Patient provided with "ticket" for LDCT Scan. yes Written Order for Lung Cancer Screening with LDCT placed in Epic. Yes (CT Chest Lung Cancer Screening Low Dose W/O CM) [id number] Z12.2-Screening of respiratory organs Z87.891-Personal history of nicotine dependence   I spent 25 minutes of face to face time with her discussing the risks and benefits of lung cancer screening. We viewed a power point together that explained in detail the above noted topics. We took the time to pause the power point at intervals to allow for questions to be asked and answered to ensure understanding. We discussed that she had taken the single most powerful action possible to decrease her risk of developing lung cancer when she quit smoking. I counseled her to remain smoke free, and to contact me if she ever had the desire to smoke again so that I can provide resources and tools to help support the effort to remain smoke free. We discussed the time and location of the scan, and that either  Doroteo Glassman RN or I will call with the results within  24-48 hours of receiving them. She has my card and contact information in the event she needs to speak with me, in addition to a copy of the power point we reviewed as a resource. She verbalized understanding of all of the above and had no further questions upon leaving the office.     I explained to the patient that there has been a high incidence of coronary  artery disease noted on these exams. I explained that this is a non-gated exam therefore degree or severity cannot be determined. This patient is on statin  therapy. I have asked the patient to follow-up with their PCP regarding any incidental finding of coronary artery disease and management with diet or medication as they feel is clinically indicated. The patient verbalized understanding of the above and had no further questions.    Raad Clayson D. Kenton Kingfisher, NP-C [facility] Personal contact information can be found on Amion  06/27/2021, 8:17 AM

## 2021-06-30 ENCOUNTER — Ambulatory Visit
Admission: RE | Admit: 2021-06-30 | Discharge: 2021-06-30 | Disposition: A | Discharge status: Home / Self Care | Payer: 59 | Source: Ambulatory Visit | Attending: Acute Care | Admitting: Acute Care

## 2021-06-30 DIAGNOSIS — Z87891 Personal history of nicotine dependence: Secondary | ICD-10-CM | POA: Diagnosis not present

## 2021-06-30 DIAGNOSIS — Z122 Encounter for screening for malignant neoplasm of respiratory organs: Secondary | ICD-10-CM

## 2021-07-03 ENCOUNTER — Other Ambulatory Visit: Payer: Self-pay

## 2021-07-03 DIAGNOSIS — Z87891 Personal history of nicotine dependence: Secondary | ICD-10-CM

## 2021-07-03 DIAGNOSIS — Z122 Encounter for screening for malignant neoplasm of respiratory organs: Secondary | ICD-10-CM

## 2021-07-09 ENCOUNTER — Other Ambulatory Visit (HOSPITAL_COMMUNITY): Payer: Self-pay

## 2021-07-09 ENCOUNTER — Other Ambulatory Visit: Payer: Self-pay | Admitting: Nurse Practitioner

## 2021-07-09 DIAGNOSIS — J449 Chronic obstructive pulmonary disease, unspecified: Secondary | ICD-10-CM

## 2021-07-09 MED ORDER — ALBUTEROL SULFATE HFA 108 (90 BASE) MCG/ACT IN AERS
INHALATION_SPRAY | RESPIRATORY_TRACT | 5 refills | Status: DC
  Filled 2021-07-09: qty 18, 16d supply, fill #0
  Filled 2021-07-23: qty 18, 16d supply, fill #1
  Filled 2021-08-19: qty 18, 16d supply, fill #2
  Filled 2021-09-17: qty 6.7, 17d supply, fill #3
  Filled 2021-10-28: qty 6.7, 16d supply, fill #4
  Filled 2021-11-24: qty 6.7, 16d supply, fill #5
  Filled 2022-01-04: qty 6.7, 17d supply, fill #6
  Filled 2022-01-29: qty 18, 17d supply, fill #7
  Filled 2022-02-26: qty 6.7, 17d supply, fill #8
  Filled 2022-04-08: qty 18, fill #9

## 2021-07-24 ENCOUNTER — Other Ambulatory Visit (HOSPITAL_COMMUNITY): Payer: Self-pay

## 2021-07-25 ENCOUNTER — Other Ambulatory Visit (HOSPITAL_COMMUNITY): Payer: Self-pay

## 2021-08-19 ENCOUNTER — Other Ambulatory Visit (HOSPITAL_COMMUNITY): Payer: Self-pay

## 2021-08-20 ENCOUNTER — Other Ambulatory Visit (HOSPITAL_COMMUNITY): Payer: Self-pay

## 2021-08-27 ENCOUNTER — Other Ambulatory Visit (HOSPITAL_COMMUNITY): Payer: Self-pay

## 2021-09-17 ENCOUNTER — Encounter (INDEPENDENT_AMBULATORY_CARE_PROVIDER_SITE_OTHER): Payer: Self-pay

## 2021-09-17 ENCOUNTER — Other Ambulatory Visit (HOSPITAL_COMMUNITY): Payer: Self-pay

## 2021-09-18 ENCOUNTER — Other Ambulatory Visit (HOSPITAL_COMMUNITY): Payer: Self-pay

## 2021-09-19 ENCOUNTER — Other Ambulatory Visit (HOSPITAL_COMMUNITY): Payer: Self-pay

## 2021-10-28 ENCOUNTER — Other Ambulatory Visit (HOSPITAL_COMMUNITY): Payer: Self-pay

## 2021-10-29 ENCOUNTER — Other Ambulatory Visit (HOSPITAL_COMMUNITY): Payer: Self-pay

## 2021-10-31 ENCOUNTER — Encounter: Payer: Self-pay | Admitting: Nurse Practitioner

## 2021-11-20 ENCOUNTER — Ambulatory Visit (INDEPENDENT_AMBULATORY_CARE_PROVIDER_SITE_OTHER): Payer: 59 | Admitting: Nurse Practitioner

## 2021-11-20 ENCOUNTER — Encounter: Payer: Self-pay | Admitting: Nurse Practitioner

## 2021-11-20 VITALS — BP 122/62 | HR 62 | Temp 97.8°F | Ht 60.0 in | Wt 208.0 lb

## 2021-11-20 DIAGNOSIS — Z1231 Encounter for screening mammogram for malignant neoplasm of breast: Secondary | ICD-10-CM

## 2021-11-20 DIAGNOSIS — J449 Chronic obstructive pulmonary disease, unspecified: Secondary | ICD-10-CM | POA: Diagnosis not present

## 2021-11-20 DIAGNOSIS — Z0000 Encounter for general adult medical examination without abnormal findings: Secondary | ICD-10-CM

## 2021-11-20 DIAGNOSIS — R7303 Prediabetes: Secondary | ICD-10-CM

## 2021-11-20 DIAGNOSIS — E782 Mixed hyperlipidemia: Secondary | ICD-10-CM

## 2021-11-20 DIAGNOSIS — Z1211 Encounter for screening for malignant neoplasm of colon: Secondary | ICD-10-CM | POA: Diagnosis not present

## 2021-11-20 DIAGNOSIS — E559 Vitamin D deficiency, unspecified: Secondary | ICD-10-CM

## 2021-11-20 DIAGNOSIS — Z6841 Body Mass Index (BMI) 40.0 and over, adult: Secondary | ICD-10-CM

## 2021-11-20 DIAGNOSIS — Z79899 Other long term (current) drug therapy: Secondary | ICD-10-CM

## 2021-11-20 NOTE — Patient Instructions (Addendum)
Health Maintenance, Female Adopting a healthy lifestyle and getting preventive care are important in promoting health and wellness. Ask your health care provider about: The right schedule for you to have regular tests and exams. Things you can do on your own to prevent diseases and keep yourself healthy. What should I know about diet, weight, and exercise? Eat a healthy diet  Eat a diet that includes plenty of vegetables, fruits, low-fat dairy products, and lean protein. Do not eat a lot of foods that are high in solid fats, added sugars, or sodium. Maintain a healthy weight Body mass index (BMI) is used to identify weight problems. It estimates body fat based on height and weight. Your health care provider can help determine your BMI and help you achieve or maintain a healthy weight. Get regular exercise Get regular exercise. This is one of the most important things you can do for your health. Most adults should: Exercise for at least 150 minutes each week. The exercise should increase your heart rate and make you sweat (moderate-intensity exercise). Do strengthening exercises at least twice a week. This is in addition to the moderate-intensity exercise. Spend less time sitting. Even light physical activity can be beneficial. Watch cholesterol and blood lipids Have your blood tested for lipids and cholesterol at 65 years of age, then have this test every 5 years. Have your cholesterol levels checked more often if: Your lipid or cholesterol levels are high. You are older than 65 years of age. You are at high risk for heart disease. What should I know about cancer screening? Depending on your health history and family history, you may need to have cancer screening at various ages. This may include screening for: Breast cancer. Cervical cancer. Colorectal cancer. Skin cancer. Lung cancer. What should I know about heart disease, diabetes, and high blood pressure? Blood pressure and heart  disease High blood pressure causes heart disease and increases the risk of stroke. This is more likely to develop in people who have high blood pressure readings or are overweight. Have your blood pressure checked: Every 3-5 years if you are 18-39 years of age. Every year if you are 40 years old or older. Diabetes Have regular diabetes screenings. This checks your fasting blood sugar level. Have the screening done: Once every three years after age 40 if you are at a normal weight and have a low risk for diabetes. More often and at a younger age if you are overweight or have a high risk for diabetes. What should I know about preventing infection? Hepatitis B If you have a higher risk for hepatitis B, you should be screened for this virus. Talk with your health care provider to find out if you are at risk for hepatitis B infection. Hepatitis C Testing is recommended for: Everyone born from 1945 through 1965. Anyone with known risk factors for hepatitis C. Sexually transmitted infections (STIs) Get screened for STIs, including gonorrhea and chlamydia, if: You are sexually active and are younger than 65 years of age. You are older than 65 years of age and your health care provider tells you that you are at risk for this type of infection. Your sexual activity has changed since you were last screened, and you are at increased risk for chlamydia or gonorrhea. Ask your health care provider if you are at risk. Ask your health care provider about whether you are at high risk for HIV. Your health care provider may recommend a prescription medicine to help prevent HIV   infection. If you choose to take medicine to prevent HIV, you should first get tested for HIV. You should then be tested every 3 months for as long as you are taking the medicine. Pregnancy If you are about to stop having your period (premenopausal) and you may become pregnant, seek counseling before you get pregnant. Take 400 to 800  micrograms (mcg) of folic acid every day if you become pregnant. Ask for birth control (contraception) if you want to prevent pregnancy. Osteoporosis and menopause Osteoporosis is a disease in which the bones lose minerals and strength with aging. This can result in bone fractures. If you are 29 years old or older, or if you are at risk for osteoporosis and fractures, ask your health care provider if you should: Be screened for bone loss. Take a calcium or vitamin D supplement to lower your risk of fractures. Be given hormone replacement therapy (HRT) to treat symptoms of menopause. Follow these instructions at home: Alcohol use Do not drink alcohol if: Your health care provider tells you not to drink. You are pregnant, may be pregnant, or are planning to become pregnant. If you drink alcohol: Limit how much you have to: 0-1 drink a day. Know how much alcohol is in your drink. In the [place] one drink equals one 12 oz bottle of beer (355 mL), one 5 oz glass of wine (148 mL), or one 1 oz glass of hard liquor (44 mL). Lifestyle Do not use any products that contain nicotine or tobacco. These products include cigarettes, chewing tobacco, and vaping devices, such as e-cigarettes. If you need help quitting, ask your health care provider. Do not use street drugs. Do not share needles. Ask your health care provider for help if you need support or information about quitting drugs. General instructions Schedule regular health, dental, and eye exams. Stay current with your vaccines. Tell your health care provider if: You often feel depressed. You have ever been abused or do not feel safe at home. Summary Adopting a healthy lifestyle and getting preventive care are important in promoting health and wellness. Follow your health care provider's instructions about healthy diet, exercising, and getting tested or screened for diseases. Follow your health care provider's instructions on monitoring your  cholesterol and blood pressure. This information is not intended to replace advice given to you by your health care provider. Make sure you discuss any questions you have with your health care provider. Document Revised: 06/17/2020 Document Reviewed: 06/17/2020 Elsevier Patient Education  [place].     Benefits of Drinking Water  Getting enough water every day is important for your health. Drinking water can prevent dehydration, a condition that can cause unclear thinking, result in mood change, cause your body to overheat, and lead to constipation and kidney stones. Water has no calories, so it can also help with managing body weight and reducing calorie intake when substituted for drinks with calories, such as sweet tea or regular soda.  Water helps your body:  Keep a normal temperature. Lubricate and cushion joints. Protect your spinal cord and other sensitive tissues. Get rid of wastes through urination, perspiration, and bowel movements. Sporty woman drinking water after exercise Your body needs more water when you are:  In hot climates. More physically active. Running a fever. Having diarrhea or vomiting. Everyone should consume water from foods and beverages every day. Although there is no recommendation for how much plain water everyone should drink daily, there are recommendations for how much daily total  water intake should come from a variety of beverages and foods.  Daily total water intake (fluid) is defined as the amount of water consumed from foods, plain drinking water, and other beverages. Daily water intake recommendations vary by age, sex, pregnancy status, and breastfeeding status.  Most of your fluid needs are met through the water and other beverages you drink. You can get some fluids through the foods that you eat--especially foods with high water content, such as many fruits and vegetables. Drinking water is one good way of getting fluids as it has zero  calories.  Tips to Drink More Water Sugary drinks contribute to type 2 diabetes heart disease and obesity. Rethink your drink, Click here to learn more: [email or web address].cz Carry a water bottle with you and refill it throughout the day. Freeze some freezer safe water bottles. Take one with you for ice-cold water all day long. Choose water over sugary drinks. Opt for water when eating out. You'll save money and reduce calories. Serve water during meals. Add a wedge of lime or lemon to your water. This can help improve the taste. Make sure your kids are getting enough water too. Learn more about drinking water in schools and early care and education settings  Healthier Drink Options Of course, there are many other beverage options besides water, and many of these can be part of a healthy diet.   Low- or no- calorie beverages Plain coffee or teas, sparkling water, seltzers, and flavored waters, are low-calorie choices that can be part of a healthy diet.  Drinks with calories and important nutrients Low-fat or fat-free milk; unsweetened, fortified milk alternatives; or 100% fruit or vegetable juice contain important nutrients such as calcium, potassium, or vitamin D. These drinks should be enjoyed within recommended calorie limits.  Other Beverages Sugary drinks: Regular sodas, fruit drinks, sports drinks, energy drinks, sweetened waters, and sweetened coffee and tea beverages, contain calories but little nutritional value [PDF-30.6MB]. Learn how to rethink your drink.  Alcoholic drinks: If you choose to drink alcohol, do so in moderation.  Caffeinated drinks: Moderate caffeine consumption (up to 400 mg per day) can be a part of a healthy diet.  That's up to about 3 to 5 cups of plain coffee.  Drinks with sugar alternatives: Drinks that are labeled "sugar-free" or "diet" likely contain high-intensity sweeteners, such as sucralose, aspartame, or saccharine. According to the Dietary  Guidelines for Americans, "replacing added sugars with high-intensity sweeteners may reduce calorie intake in the short-term.yet questions remain about their effectiveness as a long-term weight management strategy [PDF-30.6MB]." Learn more about high-intensity sweeteners.  Sports drinks: These are flavored beverages that often contain carbohydrates, minerals, electrolytes, and sometimes vitamins. The average person should drink water, not sports drinks, to rehydrate.   [email or web address] [facility] on Health Diet low in sugar

## 2021-11-20 NOTE — Progress Notes (Signed)
I,Tianna Badgett,acting as a Education administrator for [employer], FNP.,have documented all relevant documentation on the behalf of [name], FNP,as directed by  [name], FNP while in the presence of [name], [place].  Subjective:     Patient ID: Gabrielle Castro , female    DOB: 1955-07-12 , 66 y.o.   MRN: [id number]   Chief Complaint  Patient presents with   Annual Exam    HPI  The patient is here today for a physical examination.      Past Medical History:  Diagnosis Date   Anxiety    COPD (chronic obstructive pulmonary disease) ([place])    Dyspnea    EP (ectopic pregnancy)    GERD (gastroesophageal reflux disease)    Sore throat 02/15/2019     Family History  Problem Relation Age of Onset   Lung cancer Father        smoked   Cancer Father        lung; +TOB   Cancer Paternal Grandmother        "every kind of cancer"   Heart disease Mother    Diabetes Mother    Hypertension Mother    Hyperlipidemia Mother      Current Outpatient Medications:    albuterol (VENTOLIN HFA) 108 (90 Base) MCG/ACT inhaler, INHALE 2 PUFFS BY MOUTH INTO LUNGS EVERY 4 HOURS AS NEEDED FOR COUGH, WHEEZING, OR SHORTNESS OF BREATH, Disp: 18 g, Rfl: 5   Ascorbic Acid (VITAMIN C) 1000 MG tablet, Take 1,000 mg by mouth daily., Disp: , Rfl:    Biotin 10000 MCG TABS, Take 2 tablets by mouth daily., Disp: , Rfl:    Calcium 600-200 MG-UNIT tablet, Take 1 tablet by mouth daily., Disp: , Rfl:    Chlorphen-PE-Acetaminophen (NOREL AD) 4-10-325 MG TABS, Take one tablet by mouth daily., Disp: 90 tablet, Rfl: 1   cholecalciferol (VITAMIN D3) 25 MCG (1000 UT) tablet, Take 2,000 Units by mouth daily., Disp: , Rfl:    COLLAGEN PO, Take by mouth daily., Disp: , Rfl:    Lysine 1000 MG TABS, Take 1 tablet by mouth daily. Pt stated that she is only taken $RemoveBe'500mg'[id number]$ ., Disp: , Rfl:    Multiple Vitamin (MULTIVITAMIN ADULT PO), Take 100 mg by mouth 1 day or 1 dose. Neuriva, Disp: , Rfl:    rosuvastatin (CRESTOR) 5 MG tablet, TAKE  1 TABLET (5 MG TOTAL) BY MOUTH DAILY., Disp: 90 tablet, Rfl: 3   SUPER B COMPLEX/C PO, Take 2 capsules by mouth daily., Disp: , Rfl:    Tiotropium Bromide-Olodaterol (STIOLTO RESPIMAT) 2.5-2.5 MCG/ACT AERS, Inhale 2 puffs into the lungs daily., Disp: 4 g, Rfl: 5   Allergies  Allergen Reactions   Nickel Swelling, Hives and Itching   Tape Rash   Other Itching and Swelling      The patient states she is post menopausal status.  No LMP recorded. Patient is postmenopausal.  Negative for: breast discharge, breast lump(s), breast pain and breast self exam. Associated symptoms include abnormal vaginal bleeding. Pertinent negatives include abnormal bleeding (hematology), anxiety, decreased libido, depression, difficulty falling sleep, dyspareunia, history of infertility, nocturia, sexual dysfunction, sleep disturbances, urinary incontinence, urinary urgency, vaginal discharge and vaginal itching. Diet regular. The patient states her exercise level is minimal - she is walking almost 10,000 steps a day. Her goal is 7,500 steps at this time.   The patient's tobacco use is:  Social History   Tobacco Use  Smoking Status Former   Packs/day: 1.00   Years: 41.00  Total pack years: 41.00   Types: Cigarettes   Quit date: 03/03/2016   Years since quitting: 5.7  Smokeless Tobacco Never   She has been exposed to passive smoke. The patient's alcohol use is:  Social History   Substance and Sexual Activity  Alcohol Use Yes   Additional information: Last pap 08/14/2016, she declines PAP this year, would like to schedule next year.    Review of Systems  Constitutional: Negative.   HENT: Negative.    Eyes: Negative.   Respiratory: Negative.    Cardiovascular: Negative.   Gastrointestinal: Negative.   Endocrine: Negative.   Genitourinary: Negative.   Musculoskeletal: Negative.   Skin: Negative.   Allergic/Immunologic: Negative.   Neurological: Negative.   Hematological: Negative.    Psychiatric/Behavioral: Negative.       Today's Vitals   11/20/21 0839  BP: 122/62  Pulse: 62  Temp: 97.8 F (36.6 C)  TempSrc: Oral  Weight: 208 lb (94.3 kg)  Height: 5' (1.524 m)   Body mass index is 40.62 kg/m.  Wt Readings from Last 3 Encounters:  11/20/21 208 lb (94.3 kg)  06/18/21 211 lb (95.7 kg)  05/21/21 209 lb (94.8 kg)    Objective:  Physical Exam Vitals reviewed.  Constitutional:      General: She is not in acute distress.    Appearance: Normal appearance. She is well-developed. She is obese.  HENT:     Head: Normocephalic and atraumatic.     Right Ear: Hearing, tympanic membrane, ear canal and external ear normal. There is no impacted cerumen.     Left Ear: Hearing, tympanic membrane, ear canal and external ear normal. There is no impacted cerumen.     Nose:     Comments: Deferred - masked    Mouth/Throat:     Comments: Deferred - masked Eyes:     General: Lids are normal.     Extraocular Movements: Extraocular movements intact.     Conjunctiva/sclera: Conjunctivae normal.     Pupils: Pupils are equal, round, and reactive to light.     Funduscopic exam:    Right eye: No papilledema.        Left eye: No papilledema.  Neck:     Thyroid: No thyroid mass.     Vascular: No carotid bruit.  Cardiovascular:     Rate and Rhythm: Normal rate and regular rhythm.     Pulses: Normal pulses.     Heart sounds: Normal heart sounds. No murmur heard. Pulmonary:     Effort: Pulmonary effort is normal.     Breath sounds: Normal breath sounds.  Chest:     Chest wall: No mass.  Breasts:    Tanner Score is 5.     Right: Normal. No mass or tenderness.     Left: Normal. No mass or tenderness.  Abdominal:     General: Abdomen is flat. Bowel sounds are normal. There is no distension.     Palpations: Abdomen is soft.     Tenderness: There is no abdominal tenderness.  Genitourinary:    Rectum: Guaiac result negative.  Musculoskeletal:        General: No swelling.  Normal range of motion.     Cervical back: Full passive range of motion without pain, normal range of motion and neck supple.     Right lower leg: No edema.     Left lower leg: No edema.  Lymphadenopathy:     Upper Body:     Right upper body: No  supraclavicular, axillary or pectoral adenopathy.     Left upper body: No supraclavicular, axillary or pectoral adenopathy.  Skin:    General: Skin is warm and dry.     Capillary Refill: Capillary refill takes less than 2 seconds.  Neurological:     General: No focal deficit present.     Mental Status: She is alert and oriented to person, place, and time.     Cranial Nerves: No cranial nerve deficit.     Sensory: No sensory deficit.  Psychiatric:        Mood and Affect: Mood normal.        Behavior: Behavior normal.        Thought Content: Thought content normal.        Judgment: Judgment normal.         Assessment And Plan:     1. Encounter for annual physical exam Behavior modifications discussed and diet history reviewed.   Pt will continue to exercise regularly and modify diet with low GI, plant based foods and decrease intake of processed foods.  Recommend intake of daily multivitamin, Vitamin D, and calcium.  Recommend mammogram and cologuard for preventive screenings, as well as recommend immunizations that include influenza, TDAP, and Shingles (up to date), will get her flu at work  2. Encounter for screening mammogram for breast cancer Pt instructed on Self Breast Exam.According to ACOG guidelines Women aged 87 and older are recommended to get an annual mammogram. Form completed and given to patient contact the [facility] for appointment scheduing.  Pt encouraged to get annual mammogram - MM DIGITAL SCREENING BILATERAL; Future  3. Mixed hyperlipidemia Comments: Cholesterol levels are stable, continue focusing on low fat diet.  - CMP14+EGFR - Lipid panel  4. Pre-diabetes Comments: HgbA1c is stable, continue focusing  on diet low in sugar and carbohydrates. Encouraged to increase physical activity - Hemoglobin A1c  5. Vitamin D deficiency Will check vitamin D level and supplement as needed.    Also encouraged to spend 15 minutes in the sun daily.  - VITAMIN D 25 Hydroxy (Vit-D Deficiency, Fractures)  6. COPD mixed type [facility]) Comments: Continue f/u with Pulmonology.   7. Colon cancer screening Comments: Sent another order for Cologuard, encouraged to complete upon receipt  - Cologuard  8. Class 3 severe obesity due to excess calories without serious comorbidity with body mass index (BMI) of 40.0 to 44.9 in adult [facility]) Chronic Discussed healthy diet and regular exercise options  Encouraged to exercise at least 150 minutes per week with 2 days of strength training Discussion about Wegovy/Saxenda and mechanism of action. She would like to look further into this type of medication for weight loss. Encouraged to increase physical activity by getting a foot pedal to use.  She is encouraged to strive for BMI less than 30 to decrease cardiac risk.  She will call back before her next appt if interested in medication therapy. Encouraged to do a food log/journal this will help her to know what she is eating  9. Other long term (current) drug therapy - CBC   Patient was given opportunity to ask questions. Patient verbalized understanding of the plan and was able to repeat key elements of the plan. All questions were answered to their satisfaction.   [name], FNP   I, [name], FNP, have reviewed all documentation for this visit. The documentation on 11/20/21 for the exam, diagnosis, procedures, and orders are all accurate and complete.   THE PATIENT IS  ENCOURAGED TO PRACTICE SOCIAL DISTANCING DUE TO THE COVID-19 PANDEMIC.   

## 2021-11-21 LAB — CMP14+EGFR
ALT: 17 IU/L (ref 0–32)
AST: 18 IU/L (ref 0–40)
Albumin/Globulin Ratio: 1.8 (ref 1.2–2.2)
Albumin: 4.4 g/dL (ref 3.9–4.9)
Alkaline Phosphatase: 85 IU/L (ref 44–121)
BUN/Creatinine Ratio: 20 (ref 12–28)
BUN: 21 mg/dL (ref 8–27)
Bilirubin Total: 0.4 mg/dL (ref 0.0–1.2)
CO2: 24 mmol/L (ref 20–29)
Calcium: 9.9 mg/dL (ref 8.7–10.3)
Chloride: 100 mmol/L (ref 96–106)
Creatinine, Ser: 1.07 mg/dL — ABNORMAL HIGH (ref 0.57–1.00)
Globulin, Total: 2.5 g/dL (ref 1.5–4.5)
Glucose: 92 mg/dL (ref 70–99)
Potassium: 4.6 mmol/L (ref 3.5–5.2)
Sodium: 139 mmol/L (ref 134–144)
Total Protein: 6.9 g/dL (ref 6.0–8.5)
eGFR: 58 mL/min/{1.73_m2} — ABNORMAL LOW (ref >59)

## 2021-11-21 LAB — CBC
Hematocrit: 44.6 % (ref 34.0–46.6)
Hemoglobin: 14.4 g/dL (ref 11.1–15.9)
MCH: 29.9 pg (ref 26.6–33.0)
MCHC: 32.3 g/dL (ref 31.5–35.7)
MCV: 93 fL (ref 79–97)
Platelets: 222 10*3/uL (ref 150–450)
RBC: 4.82 x10E6/uL (ref 3.77–5.28)
RDW: 13.7 % (ref 11.7–15.4)
WBC: 6.1 10*3/uL (ref 3.4–10.8)

## 2021-11-21 LAB — HEMOGLOBIN A1C
Est. average glucose Bld gHb Est-mCnc: 117 mg/dL
Hgb A1c MFr Bld: 5.7 % — ABNORMAL HIGH (ref 4.8–5.6)

## 2021-11-21 LAB — LIPID PANEL
Chol/HDL Ratio: 2.4 ratio (ref 0.0–4.4)
Cholesterol, Total: 204 mg/dL — ABNORMAL HIGH (ref 100–199)
HDL: 86 mg/dL (ref >39)
LDL Chol Calc (NIH): 99 mg/dL (ref 0–99)
Triglycerides: 112 mg/dL (ref 0–149)
VLDL Cholesterol Cal: 19 mg/dL (ref 5–40)

## 2021-11-21 LAB — VITAMIN D 25 HYDROXY (VIT D DEFICIENCY, FRACTURES): Vit D, 25-Hydroxy: 42.7 ng/mL (ref 30.0–100.0)

## 2021-11-24 ENCOUNTER — Other Ambulatory Visit (HOSPITAL_COMMUNITY): Payer: Self-pay

## 2021-11-25 ENCOUNTER — Other Ambulatory Visit (HOSPITAL_COMMUNITY): Payer: Self-pay

## 2021-12-01 ENCOUNTER — Other Ambulatory Visit (HOSPITAL_COMMUNITY): Payer: Self-pay

## 2021-12-05 DIAGNOSIS — D3131 Benign neoplasm of right choroid: Secondary | ICD-10-CM | POA: Diagnosis not present

## 2021-12-05 DIAGNOSIS — H5203 Hypermetropia, bilateral: Secondary | ICD-10-CM | POA: Diagnosis not present

## 2022-01-04 ENCOUNTER — Encounter: Payer: Self-pay | Admitting: Nurse Practitioner

## 2022-01-04 ENCOUNTER — Other Ambulatory Visit: Payer: Self-pay | Admitting: Pulmonary Disease

## 2022-01-04 ENCOUNTER — Other Ambulatory Visit (HOSPITAL_COMMUNITY): Payer: Self-pay

## 2022-01-05 ENCOUNTER — Other Ambulatory Visit (HOSPITAL_COMMUNITY): Payer: Self-pay

## 2022-01-05 MED ORDER — STIOLTO RESPIMAT 2.5-2.5 MCG/ACT IN AERS
2.0000 | INHALATION_SPRAY | Freq: Every day | RESPIRATORY_TRACT | 5 refills | Status: DC
  Filled 2022-01-05: qty 4, 30d supply, fill #0
  Filled 2022-02-02: qty 4, 30d supply, fill #1
  Filled 2022-02-26 – 2022-02-27 (×2): qty 4, 30d supply, fill #2
  Filled 2022-04-08: qty 4, 30d supply, fill #3
  Filled 2022-05-08 – 2022-05-13 (×2): qty 4, 30d supply, fill #4
  Filled 2022-06-11: qty 4, 30d supply, fill #5

## 2022-01-06 ENCOUNTER — Other Ambulatory Visit (HOSPITAL_COMMUNITY): Payer: Self-pay

## 2022-01-07 ENCOUNTER — Other Ambulatory Visit (HOSPITAL_COMMUNITY): Payer: Self-pay

## 2022-01-11 ENCOUNTER — Encounter: Payer: Self-pay | Admitting: Nurse Practitioner

## 2022-01-12 ENCOUNTER — Encounter: Payer: Self-pay | Admitting: Nurse Practitioner

## 2022-01-12 ENCOUNTER — Ambulatory Visit: Payer: 59 | Admitting: Nurse Practitioner

## 2022-01-12 VITALS — BP 128/70 | HR 100 | Temp 98.4°F | Ht 60.0 in | Wt 210.0 lb

## 2022-01-12 DIAGNOSIS — E2839 Other primary ovarian failure: Secondary | ICD-10-CM | POA: Diagnosis not present

## 2022-01-12 DIAGNOSIS — Z634 Disappearance and death of family member: Secondary | ICD-10-CM | POA: Diagnosis not present

## 2022-01-12 DIAGNOSIS — M25572 Pain in left ankle and joints of left foot: Secondary | ICD-10-CM

## 2022-01-12 NOTE — Progress Notes (Signed)
I,Tianna Badgett,acting as a Education administrator for [employer], FNP.,have documented all relevant documentation on the behalf of [name], FNP,as directed by  [name], FNP while in the presence of [name], [place].  Subjective:     Patient ID: Gabrielle Castro , female    DOB: [birth year] , 66 y.o.   MRN: [id number]   Chief Complaint  Patient presents with   grief    HPI  Patient presents today for grief. She recently lost her husband. She is in the middle of setting up arrangements. She has Ollie melatonin to help her with sleep, she is afraid if she has anything heavy she will not wake up. He had not been feeling well the day after thanksgiving for change of pacemaker. He had a UTI and was treated and transferred to [facility] on Tuesday he was discharged and that night he had a heart attack and hit his head.   Her brother passed in the last 2 months. And her mother passed about a week ago. She will go to her therapist with [facility] and EAP. She is going to try melatonin gummies. One of her daughters lives with her. She had a problem going to sleep. She is planning to be out Monday, Tuesday and Wednesday. She has an office space. They will not be having another service to view her husband.      Past Medical History:  Diagnosis Date   Anxiety    COPD (chronic obstructive pulmonary disease) ([place])    Dyspnea    EP (ectopic pregnancy)    GERD (gastroesophageal reflux disease)    Sore throat 02/15/2019     Family History  Problem Relation Age of Onset   Lung cancer Father        smoked   Cancer Father        lung; +TOB   Cancer Paternal Grandmother        "every kind of cancer"   Heart disease Mother    Diabetes Mother    Hypertension Mother    Hyperlipidemia Mother      Current Outpatient Medications:    albuterol (VENTOLIN HFA) 108 (90 Base) MCG/ACT inhaler, INHALE 2 PUFFS BY MOUTH INTO LUNGS EVERY 4 HOURS AS NEEDED FOR COUGH, WHEEZING, OR SHORTNESS OF BREATH, Disp: 18 g, Rfl: 5    Ascorbic Acid (VITAMIN C) 1000 MG tablet, Take 1,000 mg by mouth daily., Disp: , Rfl:    Biotin 10000 MCG TABS, Take 2 tablets by mouth daily., Disp: , Rfl:    Calcium 600-200 MG-UNIT tablet, Take 1 tablet by mouth daily., Disp: , Rfl:    Chlorphen-PE-Acetaminophen (NOREL AD) 4-10-325 MG TABS, Take one tablet by mouth daily., Disp: 90 tablet, Rfl: 1   cholecalciferol (VITAMIN D3) 25 MCG (1000 UT) tablet, Take 2,000 Units by mouth daily., Disp: , Rfl:    COLLAGEN PO, Take by mouth daily., Disp: , Rfl:    Lysine 1000 MG TABS, Take 1 tablet by mouth daily. Pt stated that she is only taken '500mg'$ ., Disp: , Rfl:    Multiple Vitamin (MULTIVITAMIN ADULT PO), Take 100 mg by mouth 1 day or 1 dose. Neuriva, Disp: , Rfl:    rosuvastatin (CRESTOR) 5 MG tablet, TAKE 1 TABLET (5 MG TOTAL) BY MOUTH DAILY., Disp: 90 tablet, Rfl: 3   SUPER B COMPLEX/C PO, Take 2 capsules by mouth daily., Disp: , Rfl:    Tiotropium Bromide-Olodaterol (STIOLTO RESPIMAT) 2.5-2.5 MCG/ACT AERS, Inhale 2 puffs into the lungs daily., Disp: 4 g,  Rfl: 5   Allergies  Allergen Reactions   Nickel Swelling, Hives and Itching   Tape Rash   Other Itching and Swelling     Review of Systems  Constitutional: Negative.   Respiratory: Negative.    Cardiovascular:  Positive for palpitations.  Gastrointestinal: Negative.   Neurological: Negative.   Psychiatric/Behavioral: Negative.       Today's Vitals   01/12/22 1351  BP: 128/70  Pulse: 100  Temp: 98.4 F (36.9 C)  TempSrc: Oral  Weight: 210 lb (95.3 kg)  Height: 5' (1.524 m)   Body mass index is 41.01 kg/m.   Objective:  Physical Exam Vitals reviewed.  Constitutional:      Appearance: Normal appearance.  Cardiovascular:     Rate and Rhythm: Normal rate and regular rhythm.     Pulses: Normal pulses.     Heart sounds: Normal heart sounds. No murmur heard. Musculoskeletal:        General: Swelling (mild swelling lateral ankle) and tenderness (left lateral ankle) present.   Skin:    General: Skin is warm and dry.  Neurological:     General: No focal deficit present.     Mental Status: She is alert and oriented to person, place, and time.     Cranial Nerves: No cranial nerve deficit.     Motor: No weakness.         Assessment And Plan:     1. Death of husband Comments: She is only taking 3 days off and at this time. At this time she does not want any medications to help.  2. Decreased estrogen level - DG Bone Density; Future  3. Acute left ankle pain Comments: Slight swelling to left ankle. Continue antiinflammatory if no improvement will consider an xray. Able to walk on foot without difficulty     Patient was given opportunity to ask questions. Patient verbalized understanding of the plan and was able to repeat key elements of the plan. All questions were answered to their satisfaction.  [name], FNP   I, [name], FNP, have reviewed all documentation for this visit. The documentation on 01/12/22 for the exam, diagnosis, procedures, and orders are all accurate and complete.   IF YOU HAVE BEEN REFERRED TO A SPECIALIST, IT MAY TAKE 1-2 WEEKS TO SCHEDULE/PROCESS THE REFERRAL. IF YOU HAVE NOT HEARD FROM US/SPECIALIST IN TWO WEEKS, PLEASE GIVE [place] A CALL AT [phone].   THE PATIENT IS ENCOURAGED TO PRACTICE SOCIAL DISTANCING DUE TO THE COVID-19 PANDEMIC.

## 2022-01-12 NOTE — Patient Instructions (Signed)
Managing Loss, Adult People experience loss in many different ways throughout their lives. Events such as moving, changing jobs, and losing friends can create a sense of loss. The loss may be as serious as a major health change, divorce, death of a pet, or death of a loved one. All of these types of loss are likely to create a physical and emotional reaction known as grief. Grief is the result of a major change or an absence of something or someone that you count on. Grief is a normal reaction to loss. A variety of factors can affect your grieving experience, including: The nature of your loss. Your relationship to what or whom you lost. Your understanding of grief and how to manage it. Your support system. Be aware that when grief becomes extreme, it can lead to more severe issues like isolation, depression, anxiety, or suicidal thoughts. Talk with your health care provider if you have any of these issues. How to manage lifestyle changes Keep to your normal routine as much as possible. If you have trouble focusing or doing normal activities, it is acceptable to take some time away from your normal routine. Spend time with friends and loved ones. Eat a healthy diet, get plenty of sleep, and rest when you feel tired. How to recognize changes  The way that you deal with your grief will affect your ability to function as you normally do. When grieving, you may experience these changes: Numbness, shock, sadness, anxiety, anger, denial, and guilt. Thoughts about death. Unexpected crying. A physical sensation of emptiness in your stomach. Problems sleeping and eating. Tiredness (fatigue). Loss of interest in normal activities. Dreaming about or imagining seeing the person who died. A need to remember what or whom you lost. Difficulty thinking about anything other than your loss for a period of time. Relief. If you have been expecting the loss for a while, you may feel a sense of relief when it  happens. Follow these instructions at home: Activity Express your feelings in healthy ways, such as: Talking with others about your loss. It may be helpful to find others who have had a similar loss, such as a support group. Writing down your feelings in a journal. Doing physical activities to release stress and emotional energy. Doing creative activities like painting, sculpting, or playing or listening to music. Practicing resilience. This is the ability to recover and adjust after facing challenges. Reading some resources that encourage resilience may help you to learn ways to practice those behaviors.  General instructions Be patient with yourself and others. Allow the grieving process to happen, and remember that grieving takes time. It is likely that you may never feel completely done with some grief. You may find a way to move on while still cherishing memories and feelings about your loss. Accepting your loss is a process. It can take months or longer to adjust. Keep all follow-up visits. This is important. Where to find support To get support for managing loss: Ask your health care provider for help and recommendations, such as grief counseling or therapy. Think about joining a support group for people who are managing a loss. Where to find more information You can find more information about managing loss from: American Society of Clinical Oncology: www.cancer.net American Psychological Association: www.apa.org Contact a health care provider if: Your grief is extreme and keeps getting worse. You have ongoing grief that does not improve. Your body shows symptoms of grief, such as illness. You feel depressed, anxious, or   hopeless. Get help right away if: You have thoughts about hurting yourself or others. Get help right away if you feel like you may hurt yourself or others, or have thoughts about taking your own life. Go to your nearest emergency room or: Call 911. Call the  National Suicide Prevention Lifeline at 1-800-273-8255 or 988. This is open 24 hours a day. Text the Crisis Text Line at 741741. Summary Grief is the result of a major change or an absence of someone or something that you count on. Grief is a normal reaction to loss. The depth of grief and the period of recovery depend on the type of loss and your ability to adjust to the change and process your feelings. Processing grief requires patience and a willingness to accept your feelings and talk about your loss with people who are supportive. It is important to find resources that work for you and to realize that people experience grief differently. There is not one grieving process that works for everyone in the same way. Be aware that when grief becomes extreme, it can lead to more severe issues like isolation, depression, anxiety, or suicidal thoughts. Talk with your health care provider if you have any of these issues. This information is not intended to replace advice given to you by your health care provider. Make sure you discuss any questions you have with your health care provider. Document Revised: 09/16/2020 Document Reviewed: 09/16/2020 Elsevier Patient Education  2023 [employer]  

## 2022-01-29 ENCOUNTER — Other Ambulatory Visit (HOSPITAL_COMMUNITY): Payer: Self-pay

## 2022-01-29 ENCOUNTER — Other Ambulatory Visit: Payer: Self-pay

## 2022-01-29 ENCOUNTER — Ambulatory Visit
Admission: RE | Admit: 2022-01-29 | Discharge: 2022-01-29 | Disposition: A | Discharge status: Home / Self Care | Payer: 59 | Source: Ambulatory Visit | Attending: Nurse Practitioner | Admitting: Nurse Practitioner

## 2022-01-29 DIAGNOSIS — Z1231 Encounter for screening mammogram for malignant neoplasm of breast: Secondary | ICD-10-CM

## 2022-02-03 ENCOUNTER — Other Ambulatory Visit (HOSPITAL_COMMUNITY): Payer: Self-pay

## 2022-02-26 ENCOUNTER — Other Ambulatory Visit (HOSPITAL_COMMUNITY): Payer: Self-pay

## 2022-02-26 ENCOUNTER — Other Ambulatory Visit: Payer: Self-pay

## 2022-02-26 ENCOUNTER — Other Ambulatory Visit: Payer: Self-pay | Admitting: Nurse Practitioner

## 2022-02-26 DIAGNOSIS — E782 Mixed hyperlipidemia: Secondary | ICD-10-CM

## 2022-02-26 MED ORDER — ROSUVASTATIN CALCIUM 5 MG PO TABS
5.0000 mg | ORAL_TABLET | Freq: Every day | ORAL | 3 refills | Status: DC
  Filled 2022-02-26: qty 90, 90d supply, fill #0
  Filled 2022-05-25: qty 90, 90d supply, fill #1
  Filled 2022-08-24: qty 90, 90d supply, fill #2
  Filled 2022-11-20: qty 90, 90d supply, fill #3

## 2022-04-08 ENCOUNTER — Other Ambulatory Visit: Payer: Self-pay | Admitting: Nurse Practitioner

## 2022-04-08 ENCOUNTER — Other Ambulatory Visit (HOSPITAL_COMMUNITY): Payer: Self-pay

## 2022-04-08 ENCOUNTER — Other Ambulatory Visit: Payer: Self-pay

## 2022-04-08 DIAGNOSIS — J449 Chronic obstructive pulmonary disease, unspecified: Secondary | ICD-10-CM

## 2022-04-08 MED ORDER — ALBUTEROL SULFATE HFA 108 (90 BASE) MCG/ACT IN AERS
INHALATION_SPRAY | RESPIRATORY_TRACT | 5 refills | Status: DC
  Filled 2022-04-08: qty 6.7, 17d supply, fill #0
  Filled 2022-05-08 – 2022-05-13 (×2): qty 6.7, 17d supply, fill #1
  Filled 2022-05-25: qty 6.7, 17d supply, fill #2
  Filled 2022-06-11: qty 6.7, 17d supply, fill #3
  Filled 2022-07-07: qty 6.7, 16d supply, fill #4
  Filled 2022-08-09: qty 6.7, 16d supply, fill #5

## 2022-05-08 ENCOUNTER — Other Ambulatory Visit: Payer: Self-pay

## 2022-05-13 ENCOUNTER — Other Ambulatory Visit (HOSPITAL_COMMUNITY): Payer: Self-pay

## 2022-05-25 ENCOUNTER — Other Ambulatory Visit (HOSPITAL_COMMUNITY): Payer: Self-pay

## 2022-05-27 ENCOUNTER — Ambulatory Visit: Payer: 59 | Admitting: Nurse Practitioner

## 2022-05-28 ENCOUNTER — Other Ambulatory Visit: Payer: Self-pay

## 2022-06-11 ENCOUNTER — Other Ambulatory Visit (HOSPITAL_COMMUNITY): Payer: Self-pay

## 2022-07-02 ENCOUNTER — Ambulatory Visit
Admission: RE | Admit: 2022-07-02 | Discharge: 2022-07-02 | Disposition: A | Discharge status: Home / Self Care | Payer: Commercial Managed Care - PPO | Source: Ambulatory Visit | Attending: Nurse Practitioner | Admitting: Nurse Practitioner

## 2022-07-02 DIAGNOSIS — E349 Endocrine disorder, unspecified: Secondary | ICD-10-CM | POA: Diagnosis not present

## 2022-07-02 DIAGNOSIS — E2839 Other primary ovarian failure: Secondary | ICD-10-CM

## 2022-07-02 DIAGNOSIS — E559 Vitamin D deficiency, unspecified: Secondary | ICD-10-CM | POA: Diagnosis not present

## 2022-07-02 DIAGNOSIS — Z8262 Family history of osteoporosis: Secondary | ICD-10-CM | POA: Diagnosis not present

## 2022-07-02 DIAGNOSIS — N951 Menopausal and female climacteric states: Secondary | ICD-10-CM | POA: Diagnosis not present

## 2022-07-03 ENCOUNTER — Ambulatory Visit
Admission: RE | Admit: 2022-07-03 | Discharge: 2022-07-03 | Disposition: A | Discharge status: Home / Self Care | Payer: Commercial Managed Care - PPO | Source: Ambulatory Visit | Attending: Acute Care | Admitting: Acute Care

## 2022-07-03 DIAGNOSIS — Z87891 Personal history of nicotine dependence: Secondary | ICD-10-CM | POA: Diagnosis not present

## 2022-07-03 DIAGNOSIS — Z122 Encounter for screening for malignant neoplasm of respiratory organs: Secondary | ICD-10-CM

## 2022-07-07 ENCOUNTER — Other Ambulatory Visit (HOSPITAL_COMMUNITY): Payer: Self-pay

## 2022-07-13 ENCOUNTER — Other Ambulatory Visit: Payer: Self-pay

## 2022-07-13 DIAGNOSIS — Z122 Encounter for screening for malignant neoplasm of respiratory organs: Secondary | ICD-10-CM

## 2022-07-13 DIAGNOSIS — Z87891 Personal history of nicotine dependence: Secondary | ICD-10-CM

## 2022-07-15 ENCOUNTER — Encounter: Payer: Self-pay | Admitting: Pulmonary Disease

## 2022-07-15 ENCOUNTER — Other Ambulatory Visit: Payer: Self-pay | Admitting: Pulmonary Disease

## 2022-07-15 ENCOUNTER — Ambulatory Visit: Payer: Commercial Managed Care - PPO | Admitting: Pulmonary Disease

## 2022-07-15 ENCOUNTER — Other Ambulatory Visit: Payer: Self-pay

## 2022-07-15 VITALS — BP 100/80 | HR 85 | Ht 60.0 in | Wt 216.4 lb

## 2022-07-15 DIAGNOSIS — J449 Chronic obstructive pulmonary disease, unspecified: Secondary | ICD-10-CM

## 2022-07-15 DIAGNOSIS — Z87891 Personal history of nicotine dependence: Secondary | ICD-10-CM | POA: Diagnosis not present

## 2022-07-15 DIAGNOSIS — Z122 Encounter for screening for malignant neoplasm of respiratory organs: Secondary | ICD-10-CM | POA: Diagnosis not present

## 2022-07-15 MED ORDER — STIOLTO RESPIMAT 2.5-2.5 MCG/ACT IN AERS: 2.0000 | INHALATION_SPRAY | Freq: Every day | RESPIRATORY_TRACT | 5 refills | Status: DC

## 2022-07-15 MED ORDER — BREZTRI AEROSPHERE 160-9-4.8 MCG/ACT IN AERO: 2.0000 | INHALATION_SPRAY | Freq: Two times a day (BID) | RESPIRATORY_TRACT | 0 refills | Status: DC

## 2022-07-15 MED ORDER — STIOLTO RESPIMAT 2.5-2.5 MCG/ACT IN AERS
2.0000 | INHALATION_SPRAY | Freq: Every day | RESPIRATORY_TRACT | 5 refills | Status: DC
  Filled 2022-07-15: qty 4, 30d supply, fill #0

## 2022-07-15 NOTE — Progress Notes (Signed)
Synopsis: Referred in March 2023 for COPD by Arnette Felts, FNP  Subjective:   PATIENT ID: Gabrielle Castro GENDER: female DOB: September 18, 1955, MRN: [id number]  Chief Complaint  Patient presents with   Follow-up    Annual f/up    This is a 67 year old female, past medical history of anxiety, COPD, GERD.  Here today for COPD evaluation.  Patient is a former smoker 41-pack-year history quit in 2018.  Father has a history of lung cancer.Patient has not had any axial CT imaging of the chest.  Patient had office-based spirometry and February 2018 which revealed a ratio of 70 and an FEV1 of 49% predicted consistent with severe obstructive defect.  Patient is able to walk daily.  She does have some shortness of breath with exertion.  Overall no significant complaints.  She is able to complete most of her activities of daily living.  She is currently managed with Flovent does not really like her daily maintenance inhaler.  OV 06/18/2021: Doing well today.  She used Clinical cytogeneticist for a short period.  She stopped using it as he felt like it was changing her voice some.  Went back to using her Flovent.  She still uses as needed albuterol.  Enrolled in our lung cancer screening program with plans for scanning later on this month.  She is interested in potentially having samples of the new inhaler if possible.  OV 07/15/2022: Here today for follow-up for COPD.  Has been on Stiolto for a long time.  No recent exacerbations.  She has been struggling with weight.  Recent lung cancer screening CT lung RADS 2 with annual follow-up pended.  From respiratory standpoint she feels short of breath when she is exerting herself.  She does feel like it is limiting her exercise ability.  We talked a lot about weight loss today and considerations for observing calories.     Past Medical History:  Diagnosis Date   Anxiety    COPD (chronic obstructive pulmonary disease) (HCC)    Dyspnea    EP (ectopic pregnancy)    GERD  (gastroesophageal reflux disease)    Sore throat 02/15/2019     Family History  Problem Relation Age of Onset   Lung cancer Father        smoked   Cancer Father        lung; +TOB   Cancer Paternal Grandmother        "every kind of cancer"   Heart disease Mother    Diabetes Mother    Hypertension Mother    Hyperlipidemia Mother      Past Surgical History:  Procedure Laterality Date   BREAST EXCISIONAL BIOPSY Right 10+ years ago   benign   BREAST SURGERY     EYE SURGERY     HAND SURGERY     KIDNEY DONATION     KIDNEY DONATION     TUBAL LIGATION      Social History   Socioeconomic History   Marital status: Widowed    Spouse name: Shonda Likens   Number of children: 4   Years of education: Not on file   Highest education level: Associate degree: academic program  Occupational History   Occupation: Educational psychologist: [employer]  Tobacco Use   Smoking status: Former    Packs/day: 1.00    Years: 41.00    Additional pack years: 0.00    Total pack years: 41.00    Types: Cigarettes    Quit date:  03/03/2016    Years since quitting: 6.3   Smokeless tobacco: Never  Substance and Sexual Activity   Alcohol use: Yes   Drug use: Never   Sexual activity: Not Currently    Comment: since husband's stroke in 2014  Other Topics Concern   Not on file  Social History Narrative   Lives with her husband, who is disabled due to multiple cardiovascular diagnoses, and her elderly mother.   Her mother is a "pack rat." Her husband doesn't like clutter, and makes snide comments about it.   One daughter lives [place].   Other 2 daughters and son live near [place], [place].    Social Determinants of Health   Financial Resource Strain: Not on file  Food Insecurity: Not on file  Transportation Needs: Not on file  Physical Activity: Not on file  Stress: Not on file  Social Connections: Not on file  Intimate Partner Violence: Not on file     Allergies  Allergen Reactions    Nickel Swelling, Hives and Itching   Tape Rash   Other Itching and Swelling     Outpatient Medications Prior to Visit  Medication Sig Dispense Refill   albuterol (VENTOLIN HFA) 108 (90 Base) MCG/ACT inhaler INHALE 2 PUFFS BY MOUTH INTO LUNGS EVERY 4 HOURS AS NEEDED FOR COUGH, WHEEZING, OR SHORTNESS OF BREATH 6.7 g 5   Ascorbic Acid (VITAMIN C) 1000 MG tablet Take 1,000 mg by mouth daily.     Biotin 09811 MCG TABS Take 2 tablets by mouth daily.     Calcium 600-200 MG-UNIT tablet Take 1 tablet by mouth daily.     Chlorphen-PE-Acetaminophen (NOREL AD) 4-10-325 MG TABS Take one tablet by mouth daily. 90 tablet 1   cholecalciferol (VITAMIN D3) 25 MCG (1000 UT) tablet Take 2,000 Units by mouth daily.     COLLAGEN PO Take by mouth daily.     Lysine 1000 MG TABS Take 1 tablet by mouth daily. Pt stated that she is only taken 500mg .     Multiple Vitamin (MULTIVITAMIN ADULT PO) Take 100 mg by mouth 1 day or 1 dose. Neuriva     rosuvastatin (CRESTOR) 5 MG tablet Take 1 tablet (5 mg total) by mouth daily. 90 tablet 3   SUPER B COMPLEX/C PO Take 2 capsules by mouth daily.     Tiotropium Bromide-Olodaterol (STIOLTO RESPIMAT) 2.5-2.5 MCG/ACT AERS Inhale 2 puffs into the lungs daily. 4 g 5   No facility-administered medications prior to visit.    Review of Systems  Constitutional:  Negative for chills, fever, malaise/fatigue and weight loss.  HENT:  Negative for hearing loss, sore throat and tinnitus.   Eyes:  Negative for blurred vision and double vision.  Respiratory:  Positive for shortness of breath. Negative for cough, hemoptysis, sputum production, wheezing and stridor.   Cardiovascular:  Negative for chest pain, palpitations, orthopnea, leg swelling and PND.  Gastrointestinal:  Negative for abdominal pain, constipation, diarrhea, heartburn, nausea and vomiting.  Genitourinary:  Negative for dysuria, hematuria and urgency.  Musculoskeletal:  Negative for joint pain and myalgias.  Skin:  Negative  for itching and rash.  Neurological:  Negative for dizziness, tingling, weakness and headaches.  Endo/Heme/Allergies:  Negative for environmental allergies. Does not bruise/bleed easily.  Psychiatric/Behavioral:  Negative for depression. The patient is not nervous/anxious and does not have insomnia.   All other systems reviewed and are negative.    Objective:  Physical Exam Vitals reviewed.  Constitutional:      General: She is  not in acute distress.    Appearance: She is well-developed. She is obese.  HENT:     Head: Normocephalic and atraumatic.  Eyes:     General: No scleral icterus.    Conjunctiva/sclera: Conjunctivae normal.     Pupils: Pupils are equal, round, and reactive to light.  Neck:     Vascular: No JVD.     Trachea: No tracheal deviation.  Cardiovascular:     Rate and Rhythm: Normal rate and regular rhythm.     Heart sounds: Normal heart sounds. No murmur heard. Pulmonary:     Effort: Pulmonary effort is normal. No tachypnea, accessory muscle usage or respiratory distress.     Breath sounds: No stridor. No wheezing, rhonchi or rales.     Comments: Diminished breath sounds early Abdominal:     General: Bowel sounds are normal. There is no distension.     Palpations: Abdomen is soft.     Tenderness: There is no abdominal tenderness.  Musculoskeletal:        General: No tenderness.     Cervical back: Neck supple.  Lymphadenopathy:     Cervical: No cervical adenopathy.  Skin:    General: Skin is warm and dry.     Capillary Refill: Capillary refill takes less than 2 seconds.     Findings: No rash.  Neurological:     Mental Status: She is alert and oriented to person, place, and time.  Psychiatric:        Behavior: Behavior normal.      Vitals:   07/15/22 0934  BP: 100/80  Pulse: 85  SpO2: 98%  Weight: 216 lb 6.4 oz (98.2 kg)  Height: 5' (1.524 m)   98% on RA BMI Readings from Last 3 Encounters:  07/15/22 42.26 kg/m  01/12/22 41.01 kg/m   11/20/21 40.62 kg/m   Wt Readings from Last 3 Encounters:  07/15/22 216 lb 6.4 oz (98.2 kg)  01/12/22 210 lb (95.3 kg)  11/20/21 208 lb (94.3 kg)     CBC    Component Value Date/Time   WBC 6.1 11/20/2021 0937   WBC 8.5 07/19/2017 1535   RBC 4.82 11/20/2021 0937   RBC 4.93 07/19/2017 1535   HGB 14.4 11/20/2021 0937   HCT 44.6 11/20/2021 0937   PLT 222 11/20/2021 0937   MCV 93 11/20/2021 0937   MCH 29.9 11/20/2021 0937   MCH 29.3 07/19/2017 1535   MCHC 32.3 11/20/2021 0937   MCHC 32.7 07/19/2017 1535   RDW 13.7 11/20/2021 0937   LYMPHSABS 1.8 12/02/2017 0846   EOSABS 0.3 12/02/2017 0846   BASOSABS 0.1 12/02/2017 0846     Chest Imaging:  03/10/2021 chest x-ray: No acute abnormality. The patient's images have been independently reviewed by me.    Pulmonary Functions Testing Results:     No data to display          FeNO:   Pathology:   Echocardiogram:   Heart Catheterization:     Assessment & Plan:     ICD-10-CM   1. Stage 3 severe COPD by GOLD classification (HCC)  J44.9     2. Former cigarette smoker  Z87.891     3. Screening for malignant neoplasm of respiratory organ  Z12.2       Discussion:  This is a 67 year old female, stage III COPD based on prior office by spirometry.  We gave her Stiolto.  She did well with being on Stiolto.  She was on Breztri in the past and  liked voice change but unsure if that was related to it or not so she is willing to try it again.  Plan: Continue lung cancer screening annually Start Breztri samples If she does not like if she can go back to [employer] Continue albuterol as needed Continue to work with primary care discussing weight loss management options. Encouraged her to continue to exercise daily Follow-up with [place] 1 year or as needed.    Current Outpatient Medications:    albuterol (VENTOLIN HFA) 108 (90 Base) MCG/ACT inhaler, INHALE 2 PUFFS BY MOUTH INTO LUNGS EVERY 4 HOURS AS NEEDED FOR COUGH,  WHEEZING, OR SHORTNESS OF BREATH, Disp: 6.7 g, Rfl: 5   Ascorbic Acid (VITAMIN C) 1000 MG tablet, Take 1,000 mg by mouth daily., Disp: , Rfl:    Biotin 78295 MCG TABS, Take 2 tablets by mouth daily., Disp: , Rfl:    Calcium 600-200 MG-UNIT tablet, Take 1 tablet by mouth daily., Disp: , Rfl:    Chlorphen-PE-Acetaminophen (NOREL AD) 4-10-325 MG TABS, Take one tablet by mouth daily., Disp: 90 tablet, Rfl: 1   cholecalciferol (VITAMIN D3) 25 MCG (1000 UT) tablet, Take 2,000 Units by mouth daily., Disp: , Rfl:    COLLAGEN PO, Take by mouth daily., Disp: , Rfl:    Lysine 1000 MG TABS, Take 1 tablet by mouth daily. Pt stated that she is only taken 500mg ., Disp: , Rfl:    Multiple Vitamin (MULTIVITAMIN ADULT PO), Take 100 mg by mouth 1 day or 1 dose. Neuriva, Disp: , Rfl:    rosuvastatin (CRESTOR) 5 MG tablet, Take 1 tablet (5 mg total) by mouth daily., Disp: 90 tablet, Rfl: 3   SUPER B COMPLEX/C PO, Take 2 capsules by mouth daily., Disp: , Rfl:    Tiotropium Bromide-Olodaterol (STIOLTO RESPIMAT) 2.5-2.5 MCG/ACT AERS, Inhale 2 puffs into the lungs daily., Disp: 4 g, Rfl: 5    Josephine Igo, DO [facility] 07/15/2022 9:51 AM

## 2022-07-15 NOTE — Patient Instructions (Addendum)
Thank you for visiting Dr. Tonia Brooms at [facility]. Today we recommend the following:  Samples of breztri inhaler  Return in about 1 year (around 07/15/2023) for with APP or Dr. Tonia Brooms.    Please do your part to reduce the spread of COVID-19.

## 2022-07-15 NOTE — Addendum Note (Signed)
Addended by: Hedda Slade on: 07/15/2022 11:56 AM   Modules accepted: Orders

## 2022-07-17 ENCOUNTER — Other Ambulatory Visit (HOSPITAL_COMMUNITY): Payer: Self-pay

## 2022-07-23 ENCOUNTER — Encounter: Payer: Self-pay | Admitting: Pulmonary Disease

## 2022-07-27 ENCOUNTER — Other Ambulatory Visit: Payer: Self-pay | Admitting: Pulmonary Disease

## 2022-07-28 ENCOUNTER — Other Ambulatory Visit: Payer: Self-pay | Admitting: Pulmonary Disease

## 2022-07-29 MED ORDER — BREZTRI AEROSPHERE 160-9-4.8 MCG/ACT IN AERO
2.0000 | INHALATION_SPRAY | Freq: Two times a day (BID) | RESPIRATORY_TRACT | 5 refills | Status: DC
  Filled 2022-07-29: qty 10.7, 30d supply, fill #0
  Filled 2022-08-24: qty 10.7, 30d supply, fill #1
  Filled 2022-09-23: qty 10.7, 30d supply, fill #2
  Filled 2022-10-23: qty 10.7, 30d supply, fill #3
  Filled 2022-11-20: qty 10.7, 30d supply, fill #4
  Filled 2022-12-20: qty 10.7, 30d supply, fill #5

## 2022-07-30 ENCOUNTER — Other Ambulatory Visit (HOSPITAL_COMMUNITY): Payer: Self-pay

## 2022-08-10 ENCOUNTER — Other Ambulatory Visit: Payer: Self-pay

## 2022-08-20 ENCOUNTER — Other Ambulatory Visit: Payer: Self-pay | Admitting: Nurse Practitioner

## 2022-08-20 DIAGNOSIS — J449 Chronic obstructive pulmonary disease, unspecified: Secondary | ICD-10-CM

## 2022-08-21 ENCOUNTER — Other Ambulatory Visit (HOSPITAL_COMMUNITY): Payer: Self-pay

## 2022-08-24 ENCOUNTER — Other Ambulatory Visit (HOSPITAL_COMMUNITY): Payer: Self-pay

## 2022-08-24 ENCOUNTER — Other Ambulatory Visit: Payer: Self-pay

## 2022-08-24 MED ORDER — ALBUTEROL SULFATE HFA 108 (90 BASE) MCG/ACT IN AERS
2.0000 | INHALATION_SPRAY | RESPIRATORY_TRACT | 5 refills | Status: DC | PRN
Start: 2022-08-24 — End: 2023-02-14
  Filled 2022-08-24: qty 6.7, 17d supply, fill #0
  Filled 2022-09-23: qty 6.7, 17d supply, fill #1
  Filled 2022-10-23: qty 6.7, 17d supply, fill #2
  Filled 2022-11-20: qty 6.7, 17d supply, fill #3
  Filled 2022-12-20: qty 6.7, 17d supply, fill #4
  Filled 2023-01-12: qty 6.7, 17d supply, fill #5

## 2022-09-23 ENCOUNTER — Other Ambulatory Visit: Payer: Self-pay

## 2022-10-24 ENCOUNTER — Other Ambulatory Visit (HOSPITAL_COMMUNITY): Payer: Self-pay

## 2022-11-20 ENCOUNTER — Other Ambulatory Visit (HOSPITAL_COMMUNITY): Payer: Self-pay

## 2022-11-20 ENCOUNTER — Other Ambulatory Visit: Payer: Self-pay

## 2022-11-24 ENCOUNTER — Ambulatory Visit: Payer: Commercial Managed Care - PPO | Admitting: Orthopaedic Surgery

## 2022-11-24 ENCOUNTER — Other Ambulatory Visit (INDEPENDENT_AMBULATORY_CARE_PROVIDER_SITE_OTHER): Payer: Self-pay

## 2022-11-24 ENCOUNTER — Encounter: Payer: Self-pay | Admitting: Orthopaedic Surgery

## 2022-11-24 DIAGNOSIS — M25562 Pain in left knee: Secondary | ICD-10-CM | POA: Diagnosis not present

## 2022-11-24 DIAGNOSIS — Z6841 Body Mass Index (BMI) 40.0 and over, adult: Secondary | ICD-10-CM

## 2022-11-24 MED ORDER — LIDOCAINE HCL 1 % IJ SOLN
2.0000 mL | INTRAMUSCULAR | Status: AC | PRN
Start: 2022-11-24 — End: 2022-11-24
  Administered 2022-11-24: 2 mL

## 2022-11-24 MED ORDER — BUPIVACAINE HCL 0.5 % IJ SOLN
2.0000 mL | INTRAMUSCULAR | Status: AC | PRN
Start: 2022-11-24 — End: 2022-11-24
  Administered 2022-11-24: 2 mL via INTRA_ARTICULAR

## 2022-11-24 MED ORDER — METHYLPREDNISOLONE ACETATE 40 MG/ML IJ SUSP
40.0000 mg | INTRAMUSCULAR | Status: AC | PRN
Start: 2022-11-24 — End: 2022-11-24
  Administered 2022-11-24: 40 mg via INTRA_ARTICULAR

## 2022-11-24 NOTE — Progress Notes (Signed)
Office Visit Note   Patient: Gabrielle Castro           Date of Birth: 06/04/1955           MRN: [id number] Visit Date: 11/24/2022              Requested by: Arnette Felts, FNP [address] [place],  [place] [address] PCP: Arnette Felts, FNP   Assessment & Plan: Visit Diagnoses:  1. Acute pain of left knee   2. Body mass index 40.0-44.9, adult [facility])     Plan: Gabrielle Castro is a 67 year old female with left knee pain.  I feel that this is likely coming from the lateral patellofemoral facet.  She has subchondral sclerosis of the lateral patella.  We talked about the importance of weight loss and strengthening.  She cannot take NSAIDs due to having 1 kidney.  I will refer her to physical therapy.  She tolerated the cortisone injection well today.  Activity as tolerated.  The patient meets the AMA guidelines for Morbid (severe) obesity with a BMI > 40.0 and I have recommended weight loss.  Follow-Up Instructions: No follow-ups on file.   Orders:  Orders Placed This Encounter  Procedures   Large Joint Inj   XR KNEE 3 VIEW LEFT   Ambulatory referral to Physical Therapy   No orders of the defined types were placed in this encounter.     Procedures: Large Joint Inj: L knee on 11/24/2022 3:12 PM Details: 22 G needle Medications: 2 mL bupivacaine 0.5 %; 2 mL lidocaine 1 %; 40 mg methylPREDNISolone acetate 40 MG/ML Outcome: tolerated well, no immediate complications Patient was prepped and draped in the usual sterile fashion.       Clinical Data: No additional findings.   Subjective: Chief Complaint  Patient presents with   Left Knee - Pain    HPI Ms. Gabrielle Castro is a 67 year old female who comes in for evaluation of lateral left knee pain for a week.  Reports pain around the lateral retinaculum.  Has pain with start up and with getting up from sitting position.  Denies any injuries.  Review of Systems  Constitutional: Negative.   HENT: Negative.    Eyes: Negative.    Respiratory: Negative.    Cardiovascular: Negative.   Endocrine: Negative.   Musculoskeletal: Negative.   Neurological: Negative.   Hematological: Negative.   Psychiatric/Behavioral: Negative.    All other systems reviewed and are negative.    Objective: Vital Signs: There were no vitals taken for this visit.  Physical Exam Vitals and nursing note reviewed.  Constitutional:      Appearance: She is well-developed.  HENT:     Head: Atraumatic.     Nose: Nose normal.  Eyes:     Extraocular Movements: Extraocular movements intact.  Cardiovascular:     Pulses: Normal pulses.  Pulmonary:     Effort: Pulmonary effort is normal.  Abdominal:     Palpations: Abdomen is soft.  Musculoskeletal:     Cervical back: Neck supple.  Skin:    General: Skin is warm.     Capillary Refill: Capillary refill takes less than 2 seconds.  Neurological:     Mental Status: She is alert. Mental status is at baseline.  Psychiatric:        Behavior: Behavior normal.        Thought Content: Thought content normal.        Judgment: Judgment normal.     Ortho Exam Exam of the  left knee shows patellofemoral crepitus.  No joint effusion.  Slight tenderness along the lateral retinaculum.  Collaterals and cruciates are stable.  No joint line tenderness. Specialty Comments:  No specialty comments available.  Imaging: No results found.   PMFS History: Patient Active Problem List   Diagnosis Date Noted   Pre-diabetes 02/06/2020   Decreased GFR 02/20/2019   Mixed hyperlipidemia 03/21/2018   Vitamin D deficiency 01/13/2018   Body mass index (BMI) of 37.0 to 37.9 in adult 08/07/2016   Single kidney 07/03/2016   Elevated serum creatinine 07/02/2016   History of abnormal cervical Pap smear 06/30/2016   COPD GOLD II/III  03/25/2016   Past Medical History:  Diagnosis Date   Anxiety    COPD (chronic obstructive pulmonary disease) (HCC)    Dyspnea    EP (ectopic pregnancy)    GERD  (gastroesophageal reflux disease)    Sore throat 02/15/2019    Family History  Problem Relation Age of Onset   Lung cancer Father        smoked   Cancer Father        lung; +TOB   Cancer Paternal Grandmother        "every kind of cancer"   Heart disease Mother    Diabetes Mother    Hypertension Mother    Hyperlipidemia Mother     Past Surgical History:  Procedure Laterality Date   BREAST EXCISIONAL BIOPSY Right 10+ years ago   benign   BREAST SURGERY     EYE SURGERY     HAND SURGERY     KIDNEY DONATION     KIDNEY DONATION     TUBAL LIGATION     Social History   Occupational History   Occupation: Midwife payable    Employer: [employer]  Tobacco Use   Smoking status: Former    Current packs/day: 0.00    Average packs/day: 1 pack/day for 41.0 years (41.0 ttl pk-yrs)    Types: Cigarettes    Start date: 03/04/1975    Quit date: 03/03/2016    Years since quitting: 6.7   Smokeless tobacco: Never  Substance and Sexual Activity   Alcohol use: Yes   Drug use: Never   Sexual activity: Not Currently    Comment: since husband's stroke in 2014

## 2022-11-30 NOTE — Progress Notes (Signed)
Madelaine Bhat, CMA,acting as a Neurosurgeon for Arnette Felts, FNP.,have documented all relevant documentation on the behalf of Arnette Felts, FNP,as directed by  Arnette Felts, FNP while in the presence of Arnette Felts, FNP.  Subjective:    Patient ID: Gabrielle Castro , female    DOB: 08-02-1955 , 67 y.o.   MRN: [id number]  Chief Complaint  Patient presents with   Annual Exam    HPI  Patient presents today for HM, Patient reports compliance with medication. Patient denies any chest pain, SOB, or headaches. Patient has no concerns today. Patient needs a new cologuard sent over she reports she messed hers up. She has seen orthopedics for her left knee pain - diagnosed with Osteoarthritis and 2 cyst to her knee cap. She will have PT on October 30th. She continues to see Pulmonology yearly.  Reports she slipped in the bathtub earlier this year - no for sure injuries.       Past Medical History:  Diagnosis Date   Allergy    In my file   Anxiety    COPD (chronic obstructive pulmonary disease) (HCC)    Dyspnea    EP (ectopic pregnancy)    GERD (gastroesophageal reflux disease)    Sore throat 02/15/2019     Family History  Problem Relation Age of Onset   Lung cancer Father        smoked   Cancer Father        lung; +TOB   Cancer Paternal Grandmother        "every kind of cancer"   Heart disease Mother    Diabetes Mother    Hypertension Mother    Hyperlipidemia Mother    Arthritis Mother    Obesity Mother    Cancer Brother    COPD Brother    Diabetes Sister    Obesity Sister      Current Outpatient Medications:    albuterol (VENTOLIN HFA) 108 (90 Base) MCG/ACT inhaler, Inhale 2 puffs into the lungs every 4 (four) hours as needed for cough, wheezing or shortness of breath, Disp: 6.7 g, Rfl: 5   Ascorbic Acid (VITAMIN C) 1000 MG tablet, Take 1,000 mg by mouth daily., Disp: , Rfl:    Biotin 46962 MCG TABS, Take 2 tablets by mouth daily., Disp: , Rfl:     Budeson-Glycopyrrol-Formoterol (BREZTRI AEROSPHERE) 160-9-4.8 MCG/ACT AERO, Inhale 2 puffs into the lungs in the morning and at bedtime., Disp: 2 each, Rfl: 0   Budeson-Glycopyrrol-Formoterol (BREZTRI AEROSPHERE) 160-9-4.8 MCG/ACT AERO, Inhale 2 puffs into the lungs in the morning and at bedtime., Disp: 10.7 g, Rfl: 5   Calcium 600-200 MG-UNIT tablet, Take 1 tablet by mouth daily., Disp: , Rfl:    Chlorphen-PE-Acetaminophen (NOREL AD) 4-10-325 MG TABS, Take one tablet by mouth daily., Disp: 90 tablet, Rfl: 1   cholecalciferol (VITAMIN D3) 25 MCG (1000 UT) tablet, Take 2,000 Units by mouth daily., Disp: , Rfl:    COLLAGEN PO, Take by mouth daily., Disp: , Rfl:    Lysine 1000 MG TABS, Take 1 tablet by mouth daily. Pt stated that she is only taken 500mg ., Disp: , Rfl:    Multiple Vitamin (MULTIVITAMIN ADULT PO), Take 100 mg by mouth 1 day or 1 dose. Neuriva, Disp: , Rfl:    rosuvastatin (CRESTOR) 5 MG tablet, Take 1 tablet (5 mg total) by mouth daily., Disp: 90 tablet, Rfl: 3   SUPER B COMPLEX/C PO, Take 2 capsules by mouth daily., Disp: , Rfl:  Allergies  Allergen Reactions   Nickel Swelling, Hives and Itching   Tape Rash   Other Itching and Swelling      The patient states she uses post menopausal status for birth control. No LMP recorded. Patient is postmenopausal. She had a LEEP procedure and has not had a cycle since. Negative for Dysmenorrhea and Negative for Menorrhagia. Negative for: breast discharge, breast lump(s), breast pain and breast self exam. Associated symptoms include abnormal vaginal bleeding. Pertinent negatives include abnormal bleeding (hematology), anxiety, decreased libido, depression, difficulty falling sleep, dyspareunia, history of infertility, nocturia, sexual dysfunction, sleep disturbances, urinary incontinence, urinary urgency, vaginal discharge and vaginal itching. Diet regular. The patient states her exercise level is minimal - moderate - 30 minutes a day with  walking - strength training.   The patient's tobacco use is:  Social History   Tobacco Use  Smoking Status Former   Current packs/day: 0.00   Average packs/day: 1 pack/day for 41.0 years (41.0 ttl pk-yrs)   Types: Cigarettes   Start date: 03/04/1975   Quit date: 03/03/2016   Years since quitting: 6.7  Smokeless Tobacco Never   She has been exposed to passive smoke. The patient's alcohol use is:  Social History   Substance and Sexual Activity  Alcohol Use Yes   Comment: Occasionally    Review of Systems  Constitutional: Negative.   HENT: Negative.    Eyes: Negative.   Respiratory: Negative.    Cardiovascular: Negative.   Gastrointestinal: Negative.   Endocrine: Negative.   Genitourinary: Negative.   Musculoskeletal: Negative.   Skin: Negative.   Allergic/Immunologic: Negative.   Neurological: Negative.   Hematological: Negative.   Psychiatric/Behavioral: Negative.       Today's Vitals   12/01/22 0851  BP: 100/66  Pulse: 75  Temp: 97.9 F (36.6 C)  TempSrc: Oral  Weight: 207 lb 3.2 oz (94 kg)  Height: 5' (1.524 m)  PainSc: 1   PainLoc: Knee   Body mass index is 40.47 kg/m.  Wt Readings from Last 3 Encounters:  12/01/22 207 lb 3.2 oz (94 kg)  07/15/22 216 lb 6.4 oz (98.2 kg)  01/12/22 210 lb (95.3 kg)     Objective:  Physical Exam Vitals reviewed.  Constitutional:      General: She is not in acute distress.    Appearance: Normal appearance. She is well-developed. She is obese.  HENT:     Head: Normocephalic and atraumatic.     Right Ear: Hearing, tympanic membrane, ear canal and external ear normal. There is no impacted cerumen.     Left Ear: Hearing, tympanic membrane, ear canal and external ear normal. There is no impacted cerumen.     Nose: Nose normal.     Mouth/Throat:     Mouth: Mucous membranes are moist.  Eyes:     General: Lids are normal.     Extraocular Movements: Extraocular movements intact.     Conjunctiva/sclera: Conjunctivae  normal.     Pupils: Pupils are equal, round, and reactive to light.     Funduscopic exam:    Right eye: No papilledema.        Left eye: No papilledema.  Neck:     Thyroid: No thyroid mass.     Vascular: No carotid bruit.  Cardiovascular:     Rate and Rhythm: Normal rate and regular rhythm.     Pulses: Normal pulses.     Heart sounds: Normal heart sounds. No murmur heard. Pulmonary:     Effort: Pulmonary  effort is normal. No respiratory distress.     Breath sounds: Normal breath sounds. No wheezing.  Chest:     Chest wall: No mass.  Breasts:    Tanner Score is 5.     Right: Normal. No mass or tenderness.     Left: Normal. No mass or tenderness.  Abdominal:     General: Abdomen is flat. Bowel sounds are normal. There is no distension.     Palpations: Abdomen is soft.     Tenderness: There is no abdominal tenderness.  Musculoskeletal:        General: No swelling or tenderness. Normal range of motion.     Cervical back: Full passive range of motion without pain, normal range of motion and neck supple.     Right lower leg: No edema.     Left lower leg: No edema.  Lymphadenopathy:     Upper Body:     Right upper body: No supraclavicular, axillary or pectoral adenopathy.     Left upper body: No supraclavicular, axillary or pectoral adenopathy.  Skin:    General: Skin is warm and dry.     Capillary Refill: Capillary refill takes less than 2 seconds.  Neurological:     General: No focal deficit present.     Mental Status: She is alert and oriented to person, place, and time.     Cranial Nerves: No cranial nerve deficit.     Sensory: No sensory deficit.     Motor: No weakness.  Psychiatric:        Mood and Affect: Mood normal.        Behavior: Behavior normal.        Thought Content: Thought content normal.        Judgment: Judgment normal.         Assessment And Plan:     Encounter for annual health examination Assessment & Plan: Behavior modifications discussed and  diet history reviewed.   Pt will continue to exercise regularly and modify diet with low GI, plant based foods and decrease intake of processed foods.  Recommend intake of daily multivitamin, Vitamin D, and calcium.  Recommend mammogram and colonoscopy for preventive screenings, as well as recommend immunizations that include influenza, TDAP, and Shingles    Pre-diabetes Assessment & Plan: HgbA1c is slightly elevated, managed with diet and exercise  Orders: -     CMP14+EGFR -     Hemoglobin A1c  Vitamin D deficiency Assessment & Plan: Will check vitamin D level and supplement as needed.    Also encouraged to spend 15 minutes in the sun daily.    Orders: -     VITAMIN D 25 Hydroxy (Vit-D Deficiency, Fractures)  Mixed hyperlipidemia Assessment & Plan: Cholesterol levels are slightly elevated, continue statin. Tolerating well.   Orders: -     CMP14+EGFR -     Lipid panel  COVID-19 vaccine administered Assessment & Plan: Covid 19 vaccine given in office observed for 15 minutes without any adverse reaction   Orders: Best boy Vaccine 9yrs & older  Class 3 severe obesity due to excess calories without serious comorbidity with body mass index (BMI) of 40.0 to 44.9 in adult [facility]) Assessment & Plan: She is encouraged to strive for BMI less than 30 to decrease cardiac risk. Advised to aim for at least 150 minutes of exercise per week. Encouraged to incorporate at least 2 days of strength training.     Atherosclerosis of aorta (  HCC) Assessment & Plan: Continue statin, tolerating well    Colon cancer screening Assessment & Plan: According to USPTF Colorectal cancer Screening guidelines. Cologuard is recommended every 3 years, starting at age 40 years. Order for cologuard sent   Orders: -     Cologuard  Other long term (current) drug therapy -     CBC with Differential/Platelet     Return for 1 year physical, 6 month chol check. Patient was  given opportunity to ask questions. Patient verbalized understanding of the plan and was able to repeat key elements of the plan. All questions were answered to their satisfaction.   Arnette Felts, FNP  I, Arnette Felts, FNP, have reviewed all documentation for this visit. The documentation on 12/01/22 for the exam, diagnosis, procedures, and orders are all accurate and complete.

## 2022-12-01 ENCOUNTER — Encounter: Payer: Self-pay | Admitting: Nurse Practitioner

## 2022-12-01 ENCOUNTER — Ambulatory Visit: Payer: Commercial Managed Care - PPO | Admitting: Nurse Practitioner

## 2022-12-01 VITALS — BP 100/66 | HR 75 | Temp 97.9°F | Ht 60.0 in | Wt 207.2 lb

## 2022-12-01 DIAGNOSIS — Z79899 Other long term (current) drug therapy: Secondary | ICD-10-CM

## 2022-12-01 DIAGNOSIS — Z23 Encounter for immunization: Secondary | ICD-10-CM

## 2022-12-01 DIAGNOSIS — R7303 Prediabetes: Secondary | ICD-10-CM | POA: Diagnosis not present

## 2022-12-01 DIAGNOSIS — E2839 Other primary ovarian failure: Secondary | ICD-10-CM

## 2022-12-01 DIAGNOSIS — I700 Atherosclerosis of aorta: Secondary | ICD-10-CM

## 2022-12-01 DIAGNOSIS — Z6841 Body Mass Index (BMI) 40.0 and over, adult: Secondary | ICD-10-CM | POA: Diagnosis not present

## 2022-12-01 DIAGNOSIS — E66813 Obesity, class 3: Secondary | ICD-10-CM

## 2022-12-01 DIAGNOSIS — E782 Mixed hyperlipidemia: Secondary | ICD-10-CM | POA: Diagnosis not present

## 2022-12-01 DIAGNOSIS — Z0000 Encounter for general adult medical examination without abnormal findings: Secondary | ICD-10-CM

## 2022-12-01 DIAGNOSIS — E559 Vitamin D deficiency, unspecified: Secondary | ICD-10-CM

## 2022-12-01 DIAGNOSIS — Z1211 Encounter for screening for malignant neoplasm of colon: Secondary | ICD-10-CM

## 2022-12-01 NOTE — Patient Instructions (Signed)
Health Maintenance  Topic Date Due   Cologuard (Stool DNA test)  06/30/2020   Pneumonia Vaccine (2 of 2 - PCV) 11/25/2021   COVID-19 Vaccine (6 - 2023-24 season) 01/26/2023   Screening for Lung Cancer  07/03/2023   Mammogram  01/30/2024   DTaP/Tdap/Td vaccine (2 - Td or Tdap) 03/28/2024   Flu Shot  Completed   DEXA scan (bone density measurement)  Completed   Hepatitis C Screening  Completed   Zoster (Shingles) Vaccine  Completed   HPV Vaccine  Aged Out

## 2022-12-02 LAB — CMP14+EGFR
ALT: 18 IU/L (ref 0–32)
AST: 16 IU/L (ref 0–40)
Albumin: 4.3 g/dL (ref 3.9–4.9)
Alkaline Phosphatase: 77 IU/L (ref 44–121)
BUN/Creatinine Ratio: 21 (ref 12–28)
BUN: 23 mg/dL (ref 8–27)
Bilirubin Total: 0.3 mg/dL (ref 0.0–1.2)
CO2: 23 mmol/L (ref 20–29)
Calcium: 9.6 mg/dL (ref 8.7–10.3)
Chloride: 101 mmol/L (ref 96–106)
Creatinine, Ser: 1.07 mg/dL — ABNORMAL HIGH (ref 0.57–1.00)
Globulin, Total: 2.5 g/dL (ref 1.5–4.5)
Glucose: 93 mg/dL (ref 70–99)
Potassium: 4.5 mmol/L (ref 3.5–5.2)
Sodium: 139 mmol/L (ref 134–144)
Total Protein: 6.8 g/dL (ref 6.0–8.5)
eGFR: 57 mL/min/1.73 — ABNORMAL LOW

## 2022-12-02 LAB — CBC WITH DIFFERENTIAL/PLATELET
Basophils Absolute: 0.1 10*3/uL (ref 0.0–0.2)
Basos: 1 %
EOS (ABSOLUTE): 0.1 10*3/uL (ref 0.0–0.4)
Eos: 2 %
Hematocrit: 46.8 % — ABNORMAL HIGH (ref 34.0–46.6)
Hemoglobin: 15.2 g/dL (ref 11.1–15.9)
Immature Grans (Abs): 0 10*3/uL (ref 0.0–0.1)
Immature Granulocytes: 1 %
Lymphocytes Absolute: 2.1 10*3/uL (ref 0.7–3.1)
Lymphs: 30 %
MCH: 30.6 pg (ref 26.6–33.0)
MCHC: 32.5 g/dL (ref 31.5–35.7)
MCV: 94 fL (ref 79–97)
Monocytes Absolute: 0.4 10*3/uL (ref 0.1–0.9)
Monocytes: 6 %
Neutrophils Absolute: 4.3 10*3/uL (ref 1.4–7.0)
Neutrophils: 60 %
Platelets: 250 10*3/uL (ref 150–450)
RBC: 4.96 x10E6/uL (ref 3.77–5.28)
RDW: 13.2 % (ref 11.7–15.4)
WBC: 7 10*3/uL (ref 3.4–10.8)

## 2022-12-02 LAB — LIPID PANEL
Chol/HDL Ratio: 2.7 ratio (ref 0.0–4.4)
Cholesterol, Total: 207 mg/dL — ABNORMAL HIGH (ref 100–199)
HDL: 76 mg/dL (ref >39)
LDL Chol Calc (NIH): 97 mg/dL (ref 0–99)
Triglycerides: 201 mg/dL — ABNORMAL HIGH (ref 0–149)
VLDL Cholesterol Cal: 34 mg/dL (ref 5–40)

## 2022-12-02 LAB — VITAMIN D 25 HYDROXY (VIT D DEFICIENCY, FRACTURES): Vit D, 25-Hydroxy: 58.7 ng/mL (ref 30.0–100.0)

## 2022-12-02 LAB — HEMOGLOBIN A1C
Est. average glucose Bld gHb Est-mCnc: 120 mg/dL
Hgb A1c MFr Bld: 5.8 % — ABNORMAL HIGH (ref 4.8–5.6)

## 2022-12-08 DIAGNOSIS — I700 Atherosclerosis of aorta: Secondary | ICD-10-CM | POA: Insufficient documentation

## 2022-12-08 DIAGNOSIS — Z0000 Encounter for general adult medical examination without abnormal findings: Secondary | ICD-10-CM | POA: Insufficient documentation

## 2022-12-08 DIAGNOSIS — E66813 Obesity, class 3: Secondary | ICD-10-CM

## 2022-12-08 DIAGNOSIS — Z23 Encounter for immunization: Secondary | ICD-10-CM | POA: Insufficient documentation

## 2022-12-08 DIAGNOSIS — Z1211 Encounter for screening for malignant neoplasm of colon: Secondary | ICD-10-CM | POA: Insufficient documentation

## 2022-12-08 HISTORY — DX: Obesity, class 3: E66.813

## 2022-12-08 NOTE — Assessment & Plan Note (Signed)
Covid 19 vaccine given in office observed for 15 minutes without any adverse reaction  

## 2022-12-08 NOTE — Assessment & Plan Note (Signed)

## 2022-12-08 NOTE — Assessment & Plan Note (Signed)
Will check vitamin D level and supplement as needed.    Also encouraged to spend 15 minutes in the sun daily.   

## 2022-12-08 NOTE — Assessment & Plan Note (Signed)
HgbA1c is slightly elevated, managed with diet and exercise

## 2022-12-08 NOTE — Assessment & Plan Note (Signed)
Continue statin, tolerating well 

## 2022-12-08 NOTE — Assessment & Plan Note (Signed)
She is encouraged to strive for BMI less than 30 to decrease cardiac risk. Advised to aim for at least 150 minutes of exercise per week. Encouraged to incorporate at least 2 days of strength training.

## 2022-12-08 NOTE — Assessment & Plan Note (Signed)
According to USPTF Colorectal cancer Screening guidelines. Cologuard is recommended every 3 years, starting at age 66 years. Order for cologuard sent

## 2022-12-08 NOTE — Assessment & Plan Note (Signed)
Cholesterol levels are slightly elevated, continue statin. Tolerating well.

## 2022-12-09 ENCOUNTER — Ambulatory Visit: Payer: Commercial Managed Care - PPO | Admitting: Physical Therapy

## 2022-12-09 ENCOUNTER — Encounter: Payer: Self-pay | Admitting: Physical Therapy

## 2022-12-09 ENCOUNTER — Other Ambulatory Visit: Payer: Self-pay

## 2022-12-09 DIAGNOSIS — M25562 Pain in left knee: Secondary | ICD-10-CM | POA: Diagnosis not present

## 2022-12-09 NOTE — Therapy (Signed)
OUTPATIENT PHYSICAL THERAPY LOWER EXTREMITY EVALUATION   Patient Name: Gabrielle Castro MRN: [id number] DOB:07-14-1955, 67 y.o., female Today's Date: 12/09/2022  END OF SESSION:  PT End of Session - 12/09/22 1624     Visit Number 1    Date for PT Re-Evaluation 01/06/23    PT Start Time 1600    PT Stop Time 1634    PT Time Calculation (min) 34 min    Activity Tolerance Patient tolerated treatment well    Behavior During Therapy WFL for tasks assessed/performed             Past Medical History:  Diagnosis Date   Allergy    In my file   Anxiety    COPD (chronic obstructive pulmonary disease) (HCC)    Dyspnea    EP (ectopic pregnancy)    GERD (gastroesophageal reflux disease)    Sore throat 02/15/2019   Past Surgical History:  Procedure Laterality Date   BREAST EXCISIONAL BIOPSY Right 10+ years ago   benign   BREAST SURGERY     EYE SURGERY     FRACTURE SURGERY  feb 2009   broke left hand in 3 places   HAND SURGERY     KIDNEY DONATION     KIDNEY DONATION     TUBAL LIGATION     Patient Active Problem List   Diagnosis Date Noted   COVID-19 vaccine administered 12/08/2022   Class 3 severe obesity due to excess calories without serious comorbidity with body mass index (BMI) of 40.0 to 44.9 in adult (HCC) 12/08/2022   Atherosclerosis of aorta (HCC) 12/08/2022   Colon cancer screening 12/08/2022   Encounter for annual health examination 12/08/2022   Pre-diabetes 02/06/2020   Decreased GFR 02/20/2019   Mixed hyperlipidemia 03/21/2018   Vitamin D deficiency 01/13/2018   Body mass index (BMI) of 37.0 to 37.9 in adult 08/07/2016   Single kidney 07/03/2016   Elevated serum creatinine 07/02/2016   History of abnormal cervical Pap smear 06/30/2016   COPD GOLD II/III  03/25/2016    PCP: Arnette Felts, FNP   REFERRING PROVIDER: Tarry Kos, MD   REFERRING DIAG: [phone] (ICD-10-CM) - Acute pain of left knee  THERAPY DIAG:  Acute pain of left knee  Rationale  for Evaluation and Treatment: Rehabilitation  ONSET DATE: 2 week onset of pain  SUBJECTIVE:   SUBJECTIVE STATEMENT: She was been having knee pain for last 2 weels, she had injection on 11/24/22 and relays her knee is not hurting anymore.   PERTINENT HISTORY: COPD, knee patellofemoral OA PAIN:  Are you having pain? No  PRECAUTIONS: None  RED FLAGS: None   WEIGHT BEARING RESTRICTIONS: No  FALLS:  Has patient fallen in last 6 months? No  OCCUPATION: works for [employer], in finance  PLOF: Independent  PATIENT GOALS: learn what exercises to do.    OBJECTIVE:  Note: Objective measures were completed at Evaluation unless otherwise noted.  DIAGNOSTIC FINDINGS: X-rays of the left knee show subchondral cystic formation of the lateral  patella facet   PATIENT SURVEYS:  FOTO not done as one time visit most likely  COGNITION: Overall cognitive status: Within functional limits for tasks assessed     SENSATION: WFL  EDEMA:  Minimal in left knee today  MUSCLE LENGTH: Hamstrings: WFL Thomas test: mild to moderately tight bilat   PALPATION: WNL patella tracking  LOWER EXTREMITY ROM:  Active ROM Right eval Left eval  Hip flexion    Hip extension    Hip abduction  Hip adduction    Hip internal rotation    Hip external rotation    Knee flexion  WNL  Knee extension  WNL  Ankle dorsiflexion    Ankle plantarflexion    Ankle inversion    Ankle eversion     (Blank rows = not tested)  LOWER EXTREMITY MMT:  MMT Right eval Left eval  Hip flexion 5 5  Hip extension    Hip abduction 5 5  Hip adduction    Hip internal rotation    Hip external rotation    Knee flexion 5 5  Knee extension 5 5  Ankle dorsiflexion    Ankle plantarflexion    Ankle inversion    Ankle eversion     (Blank rows = not tested)  GAIT: Comments: WNL   TODAY'S TREATMENT:  Eval HEP creation and review with demonstration and trial set preformed, see below for details Nu step L6 X 10  min UE/LE   PATIENT EDUCATION: Education details: HEP, PT plan of care, return to gym activity and to avoid deep squats or lunges, avoid knee extension machine of be very light with it Person educated: Patient Education method: Explanation, Demonstration, Verbal cues, and Handouts Education comprehension: verbalized understanding and needs further education   HOME EXERCISE PROGRAM: Access Code: [email or web address] URL: [email or web address] Date: 12/09/2022 Prepared by: Ivery Quale  Exercises - Standing Quadriceps Stretch  - 2 x daily - 3-4 x weekly - 1 sets - 3 reps - 30 sec hold - Seated Straight Leg Raise with Quad Contraction  - 2 x daily - 3-4 x weekly - 2 sets - 15 reps - Mini Squat with Counter Support  - 2 x daily - 3-4 x weekly - 1-2 sets - 10 reps - Band Walks  - 2 x daily - 3-4 x weekly - 1-2 sets - 10 reps - Side Stepping with Resistance at Ankles  - 2 x daily - 3-4 x weekly - 1-2 sets - 10 reps  ASSESSMENT:  CLINICAL IMPRESSION: Patient referred to PT for left knee pain and patellofemoral OA. She is not having knee pain after recent injection so this is likely one time visit for HEP and to learn what gym modifications she should make. She will trial independent program for now and will follow up with PT if needed. We will keep her episode of care open 30 days in the event she needs to return  OBJECTIVE IMPAIRMENTS: decreased activity tolerance, difficulty walking, decreased endurance,  decreased ROM,  impaired flexibility, and pain.  ACTIVITY LIMITATIONS: bending, lifting, carry, locomotion, cleaning, longer community activity, squatting  PERSONAL FACTORS: OA are also affecting patient's functional outcome.  REHAB POTENTIAL: Excellent  CLINICAL DECISION MAKING: Stable/uncomplicated  EVALUATION COMPLEXITY: Low    GOALS: Short term PT Goals Target date: 12/09/22 Pt will show good understanding of HEP. Baseline:  Goal status: MET today   Long term PT  goals to be written only in the event she needs to return to PT  PLAN: PT FREQUENCY: 1 time visit for now unless she needs to return then will not need any more than 1 time per week  PT DURATION: 4 weeks  PLANNED INTERVENTIONS (unless contraindicated): aquatic PT, Canalith repositioning, cryotherapy, Electrical stimulation, Iontophoresis with 4 mg/ml dexamethasome, Moist heat, traction, Ultrasound, gait training, Therapeutic exercise, balance training, neuromuscular re-education, patient/family education, prosthetic training, manual techniques, passive ROM, dry needling, taping, vasopnuematic device, vestibular, spinal manipulations, joint manipulations   PLAN FOR NEXT SESSION: likely one time visit so  hold for 30 days and if no return will DC after.    April Manson, PT,DPT 12/09/2022, 4:35 PM

## 2022-12-21 ENCOUNTER — Other Ambulatory Visit: Payer: Self-pay

## 2023-01-12 ENCOUNTER — Other Ambulatory Visit (HOSPITAL_COMMUNITY): Payer: Self-pay

## 2023-01-12 ENCOUNTER — Other Ambulatory Visit: Payer: Self-pay | Admitting: Pulmonary Disease

## 2023-01-12 ENCOUNTER — Other Ambulatory Visit: Payer: Self-pay

## 2023-01-12 MED ORDER — BREZTRI AEROSPHERE 160-9-4.8 MCG/ACT IN AERO
2.0000 | INHALATION_SPRAY | Freq: Two times a day (BID) | RESPIRATORY_TRACT | 5 refills | Status: DC
  Filled 2023-01-12 – 2023-01-22 (×2): qty 10.7, 30d supply, fill #0
  Filled 2023-02-18: qty 10.7, 30d supply, fill #1
  Filled 2023-03-18: qty 10.7, 30d supply, fill #2
  Filled 2023-04-17: qty 10.7, 30d supply, fill #3
  Filled 2023-05-14: qty 10.7, 30d supply, fill #4
  Filled 2023-06-12: qty 10.7, 30d supply, fill #5

## 2023-01-13 ENCOUNTER — Other Ambulatory Visit: Payer: Self-pay

## 2023-01-14 ENCOUNTER — Other Ambulatory Visit: Payer: Self-pay

## 2023-01-14 ENCOUNTER — Telehealth: Payer: Commercial Managed Care - PPO | Admitting: Nurse Practitioner

## 2023-01-14 ENCOUNTER — Encounter: Payer: Self-pay | Admitting: Nurse Practitioner

## 2023-01-14 ENCOUNTER — Other Ambulatory Visit (HOSPITAL_COMMUNITY): Payer: Self-pay

## 2023-01-14 DIAGNOSIS — R0981 Nasal congestion: Secondary | ICD-10-CM | POA: Diagnosis not present

## 2023-01-14 DIAGNOSIS — R051 Acute cough: Secondary | ICD-10-CM

## 2023-01-14 DIAGNOSIS — J029 Acute pharyngitis, unspecified: Secondary | ICD-10-CM

## 2023-01-14 DIAGNOSIS — J449 Chronic obstructive pulmonary disease, unspecified: Secondary | ICD-10-CM | POA: Diagnosis not present

## 2023-01-14 MED ORDER — AZITHROMYCIN 250 MG PO TABS
ORAL_TABLET | ORAL | 0 refills | Status: AC
  Filled 2023-01-14: qty 6, 5d supply, fill #0

## 2023-01-14 MED ORDER — NOREL AD 4-10-325 MG PO TABS
ORAL_TABLET | ORAL | 1 refills | Status: AC
  Filled 2023-01-14 (×2): qty 60, 60d supply, fill #0
  Filled 2023-07-04: qty 60, 60d supply, fill #1

## 2023-01-14 NOTE — Progress Notes (Signed)
Virtual Visit via Video Note  Madelaine Bhat, CMA,acting as a scribe for Arnette Felts, FNP.,have documented all relevant documentation on the behalf of Arnette Felts, FNP,as directed by  Arnette Felts, FNP while in the presence of Arnette Felts, FNP.  I connected with Karen Chafe on 01/25/23 at  4:00 PM EST by a video enabled telemedicine application and verified that I am speaking with the correct person using two identifiers.  Patient Location: Home Provider Location: Office/Clinic  I discussed the limitations, risks, security, and privacy concerns of performing an evaluation and management service by video and the availability of in person appointments. I also discussed with the patient that there may be a patient responsible charge related to this service. The patient expressed understanding and agreed to proceed.  Subjective: PCP: Arnette Felts, FNP  Chief Complaint  Patient presents with   URI   Patient presents today for a sore throat, nasal congestion, cough starting the Tuesday before thanksgiving. Patient also reports initially had a headache, with fever on Friday and Saturday and does not feel well she has did 2 at home covid test that were negative. Her cough is worse the usual, clear sputum. She has a history of COPD. When she is coughing a lot she will have some shortness of breath. She feels worn out. She is still working. She is only taking her COPD medications.  Patient reports she thought she was getting better but now is feeling bad again. Patient reports she has been taking Dayquil.      ROS: Per HPI  Current Outpatient Medications:    albuterol (VENTOLIN HFA) 108 (90 Base) MCG/ACT inhaler, Inhale 2 puffs into the lungs every 4 (four) hours as needed for cough, wheezing or shortness of breath, Disp: 6.7 g, Rfl: 5   Ascorbic Acid (VITAMIN C) 1000 MG tablet, Take 1,000 mg by mouth daily., Disp: , Rfl:    Biotin 16109 MCG TABS, Take 2 tablets by mouth daily., Disp:  , Rfl:    Budeson-Glycopyrrol-Formoterol (BREZTRI AEROSPHERE) 160-9-4.8 MCG/ACT AERO, Inhale 2 puffs into the lungs in the morning and at bedtime., Disp: 2 each, Rfl: 0   Budeson-Glycopyrrol-Formoterol (BREZTRI AEROSPHERE) 160-9-4.8 MCG/ACT AERO, Inhale 2 puffs into the lungs in the morning and at bedtime., Disp: 10.7 g, Rfl: 5   Calcium 600-200 MG-UNIT tablet, Take 1 tablet by mouth daily., Disp: , Rfl:    cholecalciferol (VITAMIN D3) 25 MCG (1000 UT) tablet, Take 2,000 Units by mouth daily., Disp: , Rfl:    COLLAGEN PO, Take by mouth daily., Disp: , Rfl:    Lysine 1000 MG TABS, Take 1 tablet by mouth daily. Pt stated that she is only taken 500mg ., Disp: , Rfl:    Multiple Vitamin (MULTIVITAMIN ADULT PO), Take 100 mg by mouth 1 day or 1 dose. Neuriva, Disp: , Rfl:    rosuvastatin (CRESTOR) 5 MG tablet, Take 1 tablet (5 mg total) by mouth daily., Disp: 90 tablet, Rfl: 3   SUPER B COMPLEX/C PO, Take 2 capsules by mouth daily., Disp: , Rfl:    Chlorphen-PE-Acetaminophen (NOREL AD) 4-10-325 MG TABS, Take one tablet by mouth daily., Disp: 60 tablet, Rfl: 1  Observations/Objective: There were no vitals filed for this visit. Physical Exam Vitals reviewed.  Constitutional:      General: She is not in acute distress.    Appearance: Normal appearance. She is obese.  HENT:     Mouth/Throat:     Mouth: Mucous membranes are moist.  Comments: Oropharynx erythematous, tonsils are normal Cardiovascular:     Rate and Rhythm: Normal rate and regular rhythm.     Pulses: Normal pulses.     Heart sounds: Normal heart sounds. No murmur heard. Pulmonary:     Effort: Pulmonary effort is normal. No respiratory distress.     Breath sounds: Normal breath sounds. No wheezing.  Skin:    General: Skin is warm and dry.     Coloration: Skin is not jaundiced.  Neurological:     General: No focal deficit present.     Mental Status: She is alert and oriented to person, place, and time.     Cranial Nerves: No  cranial nerve deficit.     Motor: No weakness.  Psychiatric:        Mood and Affect: Mood normal.        Behavior: Behavior normal.        Thought Content: Thought content normal.        Judgment: Judgment normal.     Assessment and Plan: Sore throat  Acute cough Assessment & Plan: Continue inhaler, also will treat for possible pneumonia with antibiotic. If symptoms worsen go to ER for further evaluation for shortness of breath. She is to go for a chest xray   Orders: -     DG Chest 2 View; Future -     Azithromycin; Take 2 tablets (500 mg) on  Day 1,  followed by 1 tablet (250 mg) once daily on Days 2 through 5.  Dispense: 6 each; Refill: 0  Nasal congestion  COPD mixed type (HCC) Assessment & Plan: Continue inhaler as directed  Orders: -     DG Chest 2 View; Future  Other orders -     Norel AD; Take one tablet by mouth daily.  Dispense: 60 tablet; Refill: 1    Follow Up Instructions: Return for keep same next.   I discussed the assessment and treatment plan with the patient. The patient was provided an opportunity to ask questions, and all were answered. The patient agreed with the plan and demonstrated an understanding of the instructions.   The patient was advised to call back or seek an in-person evaluation if the symptoms worsen or if the condition fails to improve as anticipated.  The above assessment and management plan was discussed with the patient. The patient verbalized understanding of and has agreed to the management plan.   Jeanell Sparrow, FNP, have reviewed all documentation for this visit. The documentation on 01/14/23 for the exam, diagnosis, procedures, and orders are all accurate and complete.

## 2023-01-15 ENCOUNTER — Other Ambulatory Visit: Payer: Self-pay

## 2023-01-15 ENCOUNTER — Ambulatory Visit
Admission: RE | Admit: 2023-01-15 | Discharge: 2023-01-15 | Disposition: A | Discharge status: Home / Self Care | Payer: Commercial Managed Care - PPO | Source: Ambulatory Visit | Attending: Nurse Practitioner | Admitting: Nurse Practitioner

## 2023-01-15 DIAGNOSIS — R059 Cough, unspecified: Secondary | ICD-10-CM | POA: Diagnosis not present

## 2023-01-15 DIAGNOSIS — J449 Chronic obstructive pulmonary disease, unspecified: Secondary | ICD-10-CM

## 2023-01-15 DIAGNOSIS — R051 Acute cough: Secondary | ICD-10-CM

## 2023-01-16 DIAGNOSIS — Z1211 Encounter for screening for malignant neoplasm of colon: Secondary | ICD-10-CM | POA: Diagnosis not present

## 2023-01-22 ENCOUNTER — Other Ambulatory Visit: Payer: Self-pay

## 2023-01-22 ENCOUNTER — Other Ambulatory Visit (HOSPITAL_COMMUNITY): Payer: Self-pay

## 2023-01-24 LAB — COLOGUARD: COLOGUARD: NEGATIVE

## 2023-01-25 ENCOUNTER — Encounter: Payer: Self-pay | Admitting: Nurse Practitioner

## 2023-01-25 DIAGNOSIS — R051 Acute cough: Secondary | ICD-10-CM | POA: Insufficient documentation

## 2023-01-25 DIAGNOSIS — R0981 Nasal congestion: Secondary | ICD-10-CM | POA: Insufficient documentation

## 2023-01-25 DIAGNOSIS — J029 Acute pharyngitis, unspecified: Secondary | ICD-10-CM | POA: Insufficient documentation

## 2023-01-25 NOTE — Assessment & Plan Note (Addendum)
Continue inhaler, also will treat for possible pneumonia with antibiotic. If symptoms worsen go to ER for further evaluation for shortness of breath. She is to go for a chest xray

## 2023-01-25 NOTE — Assessment & Plan Note (Signed)
Continue inhaler as directed

## 2023-02-14 ENCOUNTER — Other Ambulatory Visit: Payer: Self-pay | Admitting: Nurse Practitioner

## 2023-02-14 DIAGNOSIS — J449 Chronic obstructive pulmonary disease, unspecified: Secondary | ICD-10-CM

## 2023-02-16 MED ORDER — ALBUTEROL SULFATE HFA 108 (90 BASE) MCG/ACT IN AERS
2.0000 | INHALATION_SPRAY | RESPIRATORY_TRACT | 5 refills | Status: DC | PRN
  Filled 2023-02-16: qty 6.7, 17d supply, fill #0
  Filled 2023-03-18: qty 6.7, 17d supply, fill #1
  Filled 2023-04-17: qty 6.7, 17d supply, fill #2
  Filled 2023-05-14: qty 6.7, 17d supply, fill #3
  Filled 2023-06-12: qty 6.7, 17d supply, fill #4
  Filled 2023-07-12: qty 6.7, 17d supply, fill #5

## 2023-02-17 ENCOUNTER — Other Ambulatory Visit: Payer: Self-pay

## 2023-02-18 ENCOUNTER — Other Ambulatory Visit: Payer: Self-pay

## 2023-02-18 ENCOUNTER — Other Ambulatory Visit: Payer: Self-pay | Admitting: Nurse Practitioner

## 2023-02-18 ENCOUNTER — Other Ambulatory Visit (HOSPITAL_BASED_OUTPATIENT_CLINIC_OR_DEPARTMENT_OTHER): Payer: Self-pay

## 2023-02-18 DIAGNOSIS — E782 Mixed hyperlipidemia: Secondary | ICD-10-CM

## 2023-02-18 MED ORDER — ROSUVASTATIN CALCIUM 5 MG PO TABS
5.0000 mg | ORAL_TABLET | Freq: Every day | ORAL | 3 refills | Status: DC
  Filled 2023-02-18: qty 90, 90d supply, fill #0
  Filled 2023-05-14: qty 90, 90d supply, fill #1
  Filled 2023-09-01: qty 90, 90d supply, fill #2
  Filled 2023-11-25: qty 90, 90d supply, fill #3

## 2023-02-19 ENCOUNTER — Other Ambulatory Visit (HOSPITAL_COMMUNITY): Payer: Self-pay

## 2023-03-18 ENCOUNTER — Other Ambulatory Visit (HOSPITAL_COMMUNITY): Payer: Self-pay

## 2023-04-17 ENCOUNTER — Other Ambulatory Visit (HOSPITAL_COMMUNITY): Payer: Self-pay

## 2023-04-19 ENCOUNTER — Other Ambulatory Visit: Payer: Self-pay

## 2023-05-14 ENCOUNTER — Other Ambulatory Visit (HOSPITAL_COMMUNITY): Payer: Self-pay

## 2023-06-08 ENCOUNTER — Other Ambulatory Visit (HOSPITAL_COMMUNITY): Payer: Self-pay

## 2023-06-08 ENCOUNTER — Other Ambulatory Visit: Payer: Self-pay

## 2023-06-08 ENCOUNTER — Ambulatory Visit: Payer: Commercial Managed Care - PPO | Admitting: Nurse Practitioner

## 2023-06-08 VITALS — BP 130/60 | HR 65 | Temp 97.8°F | Ht 60.0 in | Wt 211.4 lb

## 2023-06-08 DIAGNOSIS — J449 Chronic obstructive pulmonary disease, unspecified: Secondary | ICD-10-CM | POA: Diagnosis not present

## 2023-06-08 DIAGNOSIS — Z6841 Body Mass Index (BMI) 40.0 and over, adult: Secondary | ICD-10-CM

## 2023-06-08 DIAGNOSIS — E782 Mixed hyperlipidemia: Secondary | ICD-10-CM | POA: Diagnosis not present

## 2023-06-08 DIAGNOSIS — Z23 Encounter for immunization: Secondary | ICD-10-CM | POA: Diagnosis not present

## 2023-06-08 DIAGNOSIS — R7303 Prediabetes: Secondary | ICD-10-CM | POA: Diagnosis not present

## 2023-06-08 HISTORY — DX: Morbid (severe) obesity due to excess calories: E66.01

## 2023-06-08 MED ORDER — PHENTERMINE HCL 15 MG PO CAPS
15.0000 mg | ORAL_CAPSULE | ORAL | 1 refills | Status: DC
  Filled 2023-06-08: qty 30, 30d supply, fill #0
  Filled 2023-07-07: qty 30, 30d supply, fill #1

## 2023-06-08 NOTE — Assessment & Plan Note (Signed)
HgbA1c is slightly elevated, managed with diet and exercise

## 2023-06-08 NOTE — Assessment & Plan Note (Addendum)
 Continue inhaler as directed. Continue f/u with Pulmonology

## 2023-06-08 NOTE — Progress Notes (Signed)
 [place] Favia, CMA,acting as a Neurosurgeon for Susanna Epley, FNP.,have documented all relevant documentation on the behalf of Susanna Epley, FNP,as directed by  Susanna Epley, FNP while in the presence of Susanna Epley, FNP.  Subjective:  Patient ID: Gabrielle Castro , female    DOB: October 25, 1955 , 68 y.o.   MRN: [id number]  Chief Complaint  Patient presents with   Hyperlipidemia    Patient presents today for a chol and pre dm follow up, Patient reports compliance with medication. Patient denies any chest pain, SOB, or headaches. Patient has no concerns today.    Obesity    Patient reports she is struggling with her weight.     HPI  She is still walking but not as much.  She is taking a cholesterol medication and her inhalers. She had been going to [facility]. She is doing well with her inhalers. She is using a pain patch for her left leg.  She reports she is sleeping and dreaming a lot. More than usual.  Walking from 630-7 and then another 15 minutes in the afternoon. She is working overtime.   Patient is a 68 y/o female presenting for a follow-up appointment for cholesterol and weight management. She reports working overtime recently, which has reduced her walking routine. Patient has been using protein shakes and yogurt with granola to manage hunger. She is also eating the 45 calorie bread.  She expresses concern about potentially becoming diabetic. Has been prediabetic for several years. Sleep patterns have changed, with frequent dreaming reported. Exercise routine includes walking from 6:30-7:00 AM, and 15 minutes at both 10:30 AM and 3:30 PM. Patient uses an Apple Watch to monitor calorie burn and steps. Weight has fluctuated, with an increase noted since the last visit (204 lbs previously, 211 lbs currently).     Past Medical History:  Diagnosis Date   Allergy    In my file   Anxiety    COPD (chronic obstructive pulmonary disease) (HCC)    Dyspnea    EP (ectopic pregnancy)     GERD (gastroesophageal reflux disease)    Sore throat 02/15/2019     Family History  Problem Relation Age of Onset   Lung cancer Father        smoked   Cancer Father        lung; +TOB   Cancer Paternal Grandmother        "every kind of cancer"   Heart disease Mother    Diabetes Mother    Hypertension Mother    Hyperlipidemia Mother    Arthritis Mother    Obesity Mother    Cancer Brother    COPD Brother    Diabetes Sister    Obesity Sister      Current Outpatient Medications:    albuterol  (VENTOLIN  HFA) 108 (90 Base) MCG/ACT inhaler, Inhale 2 puffs into the lungs every 4 (four) hours as needed for cough, wheezing or shortness of breath, Disp: 6.7 g, Rfl: 5   Ascorbic Acid (VITAMIN C) 1000 MG tablet, Take 1,000 mg by mouth daily., Disp: , Rfl:    Biotin 19147 MCG TABS, Take 2 tablets by mouth daily., Disp: , Rfl:    Budeson-Glycopyrrol-Formoterol  (BREZTRI  AEROSPHERE) 160-9-4.8 MCG/ACT AERO, Inhale 2 puffs into the lungs in the morning and at bedtime., Disp: 2 each, Rfl: 0   Budeson-Glycopyrrol-Formoterol  (BREZTRI  AEROSPHERE) 160-9-4.8 MCG/ACT AERO, Inhale 2 puffs into the lungs in the morning and at bedtime., Disp: 10.7 g, Rfl: 5   Calcium   600-200 MG-UNIT tablet, Take 1 tablet by mouth daily., Disp: , Rfl:    Chlorphen-PE-Acetaminophen (NOREL AD) 4-10-325 MG TABS, Take one tablet by mouth daily., Disp: 60 tablet, Rfl: 1   cholecalciferol (VITAMIN D3) 25 MCG (1000 UT) tablet, Take 2,000 Units by mouth daily., Disp: , Rfl:    COLLAGEN PO, Take by mouth daily., Disp: , Rfl:    Lysine 1000 MG TABS, Take 1 tablet by mouth daily. Pt stated that she is only taken 500mg ., Disp: , Rfl:    Multiple Vitamin (MULTIVITAMIN ADULT PO), Take 100 mg by mouth 1 day or 1 dose. Neuriva, Disp: , Rfl:    phentermine 15 MG capsule, Take 1 capsule (15 mg total) by mouth every morning., Disp: 30 capsule, Rfl: 1   rosuvastatin  (CRESTOR ) 5 MG tablet, Take 1 tablet (5 mg total) by mouth daily., Disp: 90  tablet, Rfl: 3   SUPER B COMPLEX/C PO, Take 2 capsules by mouth daily., Disp: , Rfl:    Allergies  Allergen Reactions   Nickel Swelling, Hives and Itching   Tape Rash   Other Itching and Swelling     Review of Systems  Constitutional: Negative.   HENT: Negative.    Eyes: Negative.   Respiratory: Negative.    Cardiovascular: Negative.   Gastrointestinal: Negative.   Endocrine: Negative.   Genitourinary: Negative.   Musculoskeletal: Negative.   Skin: Negative.   Allergic/Immunologic: Negative.   Neurological: Negative.   Hematological: Negative.   Psychiatric/Behavioral: Negative.       Today's Vitals   06/08/23 0922  BP: 130/60  Pulse: 65  Temp: 97.8 F (36.6 C)  TempSrc: Oral  Weight: 211 lb 6.4 oz (95.9 kg)  Height: 5' (1.524 m)  PainSc: 0-No pain   Body mass index is 41.29 kg/m.  Wt Readings from Last 3 Encounters:  06/08/23 211 lb 6.4 oz (95.9 kg)  12/01/22 207 lb 3.2 oz (94 kg)  07/15/22 216 lb 6.4 oz (98.2 kg)    The 10-year ASCVD risk score (Arnett DK, et al., 2019) is: 5.8%   Values used to calculate the score:     Age: 87 years     Sex: Female     Is Non-Hispanic African American: No     Diabetic: No     Tobacco smoker: No     Systolic Blood Pressure: 130 mmHg     Is BP treated: No     HDL Cholesterol: 76 mg/dL     Total Cholesterol: 207 mg/dL  Objective:  Physical Exam Vitals and nursing note reviewed.  Constitutional:      General: She is not in acute distress.    Appearance: Normal appearance. She is obese.  Cardiovascular:     Rate and Rhythm: Normal rate and regular rhythm.     Pulses: Normal pulses.     Heart sounds: Normal heart sounds. No murmur heard. Pulmonary:     Effort: Pulmonary effort is normal. No respiratory distress.     Breath sounds: Normal breath sounds. No wheezing.  Skin:    General: Skin is warm and dry.     Capillary Refill: Capillary refill takes less than 2 seconds.  Neurological:     General: No focal  deficit present.     Mental Status: She is alert and oriented to person, place, and time.     Cranial Nerves: No cranial nerve deficit.     Motor: No weakness.  Psychiatric:        Mood and  Affect: Mood normal.        Behavior: Behavior normal.        Thought Content: Thought content normal.        Judgment: Judgment normal.     Assessment And Plan:  Mixed hyperlipidemia Assessment & Plan: Cholesterol levels are slightly elevated, continue statin. Tolerating well.   Orders: -     Lipid panel -     CMP14+EGFR  Pre-diabetes Assessment & Plan: HgbA1c is slightly elevated, managed with diet and exercise  Orders: -     Hemoglobin A1c  COVID-19 vaccine administered Assessment & Plan: Covid 19 vaccine given in office observed for 15 minutes without any adverse reaction   Orders: -     Pfizer Comirnaty Covid-19 Vaccine 47yrs & older  Need for pneumococcal vaccination -     Pneumococcal conjugate vaccine 20-valent  Morbid obesity with BMI of 40.0-44.9, adult (HCC) Assessment & Plan:  She is encouraged to strive for BMI less than 30 to decrease cardiac risk. Advised to aim for at least 150 minutes of exercise per week. Recommend incorporating strength training, possibly at local senior center or [facility] on healthy diet. Will start Phentermine for weight loss after confirming no contraindications with EKG, EKG done with NSR HR 65. She will follow up in two months to assess progress and monitor for side effects.  Advised on importance of sustainable lifestyle changes for long-term weight management  Orders: -     EKG 12-Lead -     Phentermine HCl; Take 1 capsule (15 mg total) by mouth every morning.  Dispense: 30 capsule; Refill: 1  COPD GOLD II/III  Assessment & Plan: Continue inhaler as directed. Continue f/u with Pulmonology     Return for 6 month chol check.  Patient was given opportunity to ask questions. Patient verbalized understanding of the plan and was able to  repeat key elements of the plan. All questions were answered to their satisfaction.    Inge Mangle, FNP, have reviewed all documentation for this visit. The documentation on 06/08/23 for the exam, diagnosis, procedures, and orders are all accurate and complete.   IF YOU HAVE BEEN REFERRED TO A SPECIALIST, IT MAY TAKE 1-2 WEEKS TO SCHEDULE/PROCESS THE REFERRAL. IF YOU HAVE NOT HEARD FROM US /SPECIALIST IN TWO WEEKS, PLEASE GIVE US  A CALL AT [phone].

## 2023-06-08 NOTE — Assessment & Plan Note (Signed)
 Covid 19 vaccine given in office observed for 15 minutes without any adverse reaction

## 2023-06-08 NOTE — Assessment & Plan Note (Signed)
Cholesterol levels are slightly elevated, continue statin. Tolerating well.

## 2023-06-08 NOTE — Assessment & Plan Note (Addendum)
 She is encouraged to strive for BMI less than 30 to decrease cardiac risk. Advised to aim for at least 150 minutes of exercise per week. Recommend incorporating strength training, possibly at local senior center or [facility] on healthy diet. Will start Phentermine for weight loss after confirming no contraindications with EKG, EKG done with NSR HR 65. She will follow up in two months to assess progress and monitor for side effects.  Advised on importance of sustainable lifestyle changes for long-term weight management

## 2023-06-09 LAB — CMP14+EGFR
ALT: 19 IU/L (ref 0–32)
AST: 20 IU/L (ref 0–40)
Albumin: 4.4 g/dL (ref 3.9–4.9)
Alkaline Phosphatase: 71 IU/L (ref 44–121)
BUN/Creatinine Ratio: 30 — ABNORMAL HIGH (ref 12–28)
BUN: 29 mg/dL — ABNORMAL HIGH (ref 8–27)
Bilirubin Total: 0.4 mg/dL (ref 0.0–1.2)
CO2: 25 mmol/L (ref 20–29)
Calcium: 10.2 mg/dL (ref 8.7–10.3)
Chloride: 103 mmol/L (ref 96–106)
Creatinine, Ser: 0.98 mg/dL (ref 0.57–1.00)
Globulin, Total: 2.3 g/dL (ref 1.5–4.5)
Glucose: 89 mg/dL (ref 70–99)
Potassium: 4.8 mmol/L (ref 3.5–5.2)
Sodium: 143 mmol/L (ref 134–144)
Total Protein: 6.7 g/dL (ref 6.0–8.5)
eGFR: 64 mL/min/{1.73_m2} (ref >59)

## 2023-06-09 LAB — LIPID PANEL
Chol/HDL Ratio: 2.2 ratio (ref 0.0–4.4)
Cholesterol, Total: 185 mg/dL (ref 100–199)
HDL: 85 mg/dL (ref >39)
LDL Chol Calc (NIH): 86 mg/dL (ref 0–99)
Triglycerides: 75 mg/dL (ref 0–149)
VLDL Cholesterol Cal: 14 mg/dL (ref 5–40)

## 2023-06-09 LAB — HEMOGLOBIN A1C
Est. average glucose Bld gHb Est-mCnc: 114 mg/dL
Hgb A1c MFr Bld: 5.6 % (ref 4.8–5.6)

## 2023-06-12 ENCOUNTER — Other Ambulatory Visit (HOSPITAL_COMMUNITY): Payer: Self-pay

## 2023-07-04 ENCOUNTER — Other Ambulatory Visit: Payer: Self-pay

## 2023-07-07 ENCOUNTER — Other Ambulatory Visit: Payer: Self-pay

## 2023-07-07 ENCOUNTER — Other Ambulatory Visit (HOSPITAL_COMMUNITY): Payer: Self-pay

## 2023-07-12 ENCOUNTER — Other Ambulatory Visit (HOSPITAL_COMMUNITY): Payer: Self-pay

## 2023-07-12 ENCOUNTER — Other Ambulatory Visit: Payer: Self-pay

## 2023-07-12 ENCOUNTER — Other Ambulatory Visit: Payer: Self-pay | Admitting: Pulmonary Disease

## 2023-07-12 MED ORDER — BREZTRI AEROSPHERE 160-9-4.8 MCG/ACT IN AERO
2.0000 | INHALATION_SPRAY | Freq: Two times a day (BID) | RESPIRATORY_TRACT | 5 refills | Status: DC
  Filled 2023-07-12: qty 10.7, 30d supply, fill #0
  Filled 2023-08-12: qty 10.7, 30d supply, fill #1
  Filled 2023-09-07: qty 10.7, 30d supply, fill #2

## 2023-07-14 ENCOUNTER — Ambulatory Visit: Payer: Self-pay | Admitting: Nurse Practitioner

## 2023-07-14 ENCOUNTER — Encounter: Payer: Self-pay | Admitting: Nurse Practitioner

## 2023-07-14 ENCOUNTER — Other Ambulatory Visit: Payer: Self-pay | Admitting: Acute Care

## 2023-07-14 DIAGNOSIS — Z122 Encounter for screening for malignant neoplasm of respiratory organs: Secondary | ICD-10-CM

## 2023-07-14 DIAGNOSIS — Z87891 Personal history of nicotine dependence: Secondary | ICD-10-CM

## 2023-07-29 ENCOUNTER — Telehealth: Payer: Self-pay | Admitting: *Deleted

## 2023-07-29 NOTE — Telephone Encounter (Signed)
 PT called upset a CT appt was made for her that she did not know about. Also, [facility] would not cancel it for her. She mentioned that she does want to cancel it now, does not want to resched, and wanted to make appt w/Ms. Groce who she will discuss it with later. 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 Just an FYI. TY.

## 2023-07-29 NOTE — Telephone Encounter (Signed)
 Lmam for patient stating that this appt was scheduled due to ct spots filling up I also left vm for her to call and resc if this will not work for her

## 2023-07-30 ENCOUNTER — Other Ambulatory Visit: Payer: Self-pay | Admitting: Nurse Practitioner

## 2023-07-30 DIAGNOSIS — J449 Chronic obstructive pulmonary disease, unspecified: Secondary | ICD-10-CM

## 2023-07-31 ENCOUNTER — Other Ambulatory Visit (HOSPITAL_COMMUNITY): Payer: Self-pay

## 2023-08-02 ENCOUNTER — Encounter: Payer: Self-pay | Admitting: Nurse Practitioner

## 2023-08-02 ENCOUNTER — Other Ambulatory Visit: Payer: Self-pay | Admitting: Nurse Practitioner

## 2023-08-02 ENCOUNTER — Other Ambulatory Visit (HOSPITAL_COMMUNITY): Payer: Self-pay

## 2023-08-02 ENCOUNTER — Other Ambulatory Visit: Payer: Self-pay

## 2023-08-02 MED ORDER — ALBUTEROL SULFATE HFA 108 (90 BASE) MCG/ACT IN AERS
2.0000 | INHALATION_SPRAY | RESPIRATORY_TRACT | 5 refills | Status: DC | PRN
  Filled 2023-08-02: qty 6.7, 16d supply, fill #0
  Filled 2023-09-01: qty 6.7, 16d supply, fill #1
  Filled 2023-09-30: qty 6.7, 16d supply, fill #2
  Filled 2023-10-28: qty 6.7, 16d supply, fill #3
  Filled 2023-11-29: qty 6.7, 16d supply, fill #4
  Filled 2023-12-30: qty 6.7, 16d supply, fill #5

## 2023-08-02 MED ORDER — PHENTERMINE HCL 15 MG PO CAPS
15.0000 mg | ORAL_CAPSULE | ORAL | 1 refills | Status: DC
  Filled 2023-08-02 – 2023-08-04 (×2): qty 30, 30d supply, fill #0
  Filled 2023-09-01: qty 30, 30d supply, fill #1

## 2023-08-04 ENCOUNTER — Other Ambulatory Visit: Payer: Self-pay

## 2023-08-04 ENCOUNTER — Other Ambulatory Visit (HOSPITAL_COMMUNITY): Payer: Self-pay

## 2023-08-04 NOTE — Telephone Encounter (Signed)
 Spoke to the patient and let her know we cancelled the ct

## 2023-08-05 ENCOUNTER — Other Ambulatory Visit

## 2023-08-12 ENCOUNTER — Other Ambulatory Visit (HOSPITAL_COMMUNITY): Payer: Self-pay

## 2023-08-12 ENCOUNTER — Encounter: Payer: Self-pay | Admitting: Nurse Practitioner

## 2023-08-12 ENCOUNTER — Ambulatory Visit: Payer: Self-pay | Admitting: Nurse Practitioner

## 2023-08-12 VITALS — BP 120/78 | HR 72 | Temp 98.5°F | Ht 60.0 in | Wt 198.6 lb

## 2023-08-12 DIAGNOSIS — E6609 Other obesity due to excess calories: Secondary | ICD-10-CM | POA: Diagnosis not present

## 2023-08-12 DIAGNOSIS — Z6838 Body mass index (BMI) 38.0-38.9, adult: Secondary | ICD-10-CM

## 2023-08-12 DIAGNOSIS — E66812 Obesity, class 2: Secondary | ICD-10-CM | POA: Diagnosis not present

## 2023-08-12 DIAGNOSIS — R7303 Prediabetes: Secondary | ICD-10-CM | POA: Diagnosis not present

## 2023-08-12 DIAGNOSIS — E782 Mixed hyperlipidemia: Secondary | ICD-10-CM

## 2023-08-12 HISTORY — DX: Other obesity due to excess calories: E66.09

## 2023-08-12 NOTE — Assessment & Plan Note (Signed)
 HgbA1c is normal range, managed with diet and exercise. Continue focusing on weight loss

## 2023-08-12 NOTE — Progress Notes (Signed)
 LILLETTE Kristeen JINNY Gladis, CMA,acting as a Neurosurgeon for [name], FNP.,have documented all relevant documentation on the behalf of [name], FNP,as directed by  [name], FNP while in the presence of [name], FNP.  Subjective:  Patient ID: Gabrielle Castro , female    DOB: Dec 11, 1955 , 68 y.o.   MRN: [id number]  Chief Complaint  Patient presents with   Obesity    Patient presents today for a obesity follow up, Patient reports compliance with medication. Patient denies any chest pain, SOB, or headaches. Patient has no concerns today.     HPI  Here for weight check she is taking phentermine  15 mg and she is walking - a total about 5 miles per day.  She mows her grass on the weekends. She drinks protein shake and a sandwich at lunch and a little meal at dinner. She is not snacking at lunch.      Past Medical History:  Diagnosis Date   Allergy    In my file   Anxiety    Who doesnt!!   COPD (chronic obstructive pulmonary disease) (HCC)    In my file   Dyspnea    EP (ectopic pregnancy)    GERD (gastroesophageal reflux disease)    Use tums   Sore throat 02/15/2019     Family History  Problem Relation Age of Onset   Lung cancer Father        smoked   Cancer Father        lung; +TOB   Cancer Paternal Grandmother        every kind of cancer   Heart disease Mother    Diabetes Mother    Hypertension Mother    Hyperlipidemia Mother    Arthritis Mother    Obesity Mother    Cancer Brother    COPD Brother    Diabetes Sister    Obesity Sister      Current Outpatient Medications:    albuterol  (VENTOLIN  HFA) 108 (90 Base) MCG/ACT inhaler, Inhale 2 puffs into the lungs every 4 (four) hours as needed for cough, wheezing or shortness of breath, Disp: 6.7 g, Rfl: 5   Ascorbic Acid (VITAMIN C) 1000 MG tablet, Take 1,000 mg by mouth daily., Disp: , Rfl:    Biotin 89999 MCG TABS, Take 2 tablets by mouth daily., Disp: , Rfl:    Budeson-Glycopyrrol-Formoterol  (BREZTRI  AEROSPHERE)  160-9-4.8 MCG/ACT AERO, Inhale 2 puffs into the lungs in the morning and at bedtime., Disp: 2 each, Rfl: 0   budesonide -glycopyrrolate -formoterol  (BREZTRI  AEROSPHERE) 160-9-4.8 MCG/ACT AERO inhaler, Inhale 2 puffs into the lungs in the morning and at bedtime., Disp: 10.7 g, Rfl: 5   Calcium  600-200 MG-UNIT tablet, Take 1 tablet by mouth daily., Disp: , Rfl:    Chlorphen-PE-Acetaminophen (NOREL AD) 4-10-325 MG TABS, Take one tablet by mouth daily., Disp: 60 tablet, Rfl: 1   cholecalciferol (VITAMIN D3) 25 MCG (1000 UT) tablet, Take 2,000 Units by mouth daily., Disp: , Rfl:    COLLAGEN PO, Take by mouth daily., Disp: , Rfl:    Lysine 1000 MG TABS, Take 1 tablet by mouth daily. Pt stated that she is only taken 500mg ., Disp: , Rfl:    Multiple Vitamin (MULTIVITAMIN ADULT PO), Take 100 mg by mouth 1 day or 1 dose. Neuriva, Disp: , Rfl:    phentermine  15 MG capsule, Take 1 capsule (15 mg total) by mouth every morning., Disp: 30 capsule, Rfl: 1   rosuvastatin  (CRESTOR ) 5 MG tablet, Take 1 tablet (5  mg total) by mouth daily., Disp: 90 tablet, Rfl: 3   SUPER B COMPLEX/C PO, Take 2 capsules by mouth daily., Disp: , Rfl:    Allergies  Allergen Reactions   Nickel Swelling, Hives and Itching   Tape Rash   Other Itching and Swelling     Review of Systems  Constitutional: Negative.   Respiratory: Negative.    Cardiovascular: Negative.   Musculoskeletal: Negative.   Skin: Negative.      Today's Vitals   08/12/23 0825  BP: 120/78  Pulse: 72  Temp: 98.5 F (36.9 C)  TempSrc: Oral  Weight: 198 lb 9.6 oz (90.1 kg)  Height: 5' (1.524 m)  PainSc: 0-No pain   Body mass index is 38.79 kg/m.  Wt Readings from Last 3 Encounters:  08/12/23 198 lb 9.6 oz (90.1 kg)  06/08/23 211 lb 6.4 oz (95.9 kg)  12/01/22 207 lb 3.2 oz (94 kg)     Objective:  Physical Exam Vitals and nursing note reviewed.  Constitutional:      General: She is not in acute distress.    Appearance: Normal appearance. She is  obese.  Cardiovascular:     Rate and Rhythm: Normal rate and regular rhythm.     Pulses: Normal pulses.     Heart sounds: Normal heart sounds. No murmur heard. Pulmonary:     Effort: Pulmonary effort is normal. No respiratory distress.     Breath sounds: Normal breath sounds. No wheezing.  Skin:    General: Skin is warm and dry.     Capillary Refill: Capillary refill takes less than 2 seconds.     Findings: Bruising (left thigh - after jumping in water at [facility]) present.  Neurological:     General: No focal deficit present.     Mental Status: She is alert and oriented to person, place, and time.     Cranial Nerves: No cranial nerve deficit.     Motor: No weakness.  Psychiatric:        Mood and Affect: Mood normal.        Behavior: Behavior normal.        Thought Content: Thought content normal.        Judgment: Judgment normal.      Assessment And Plan:  Class 2 obesity due to excess calories with body mass index (BMI) of 38.0 to 38.9 in adult, unspecified whether serious comorbidity present Assessment & Plan: She has done well and has lost 13 lbs since her last visit while taking phentermine  15mg  daily. Tolerating well. Continue exercising regularly.    Mixed hyperlipidemia Assessment & Plan: Cholesterol levels are slightly elevated, continue statin. Tolerating well.    Pre-diabetes Assessment & Plan: HgbA1c is normal range, managed with diet and exercise. Continue focusing on weight loss     Return for keep same next.  Patient was given opportunity to ask questions. Patient verbalized understanding of the plan and was able to repeat key elements of the plan. All questions were answered to their satisfaction.    LILLETTE [name], FNP, have reviewed all documentation for this visit. The documentation on 08/12/23 for the exam, diagnosis, procedures, and orders are all accurate and complete.   IF YOU HAVE BEEN REFERRED TO A SPECIALIST, IT MAY TAKE 1-2 WEEKS TO  SCHEDULE/PROCESS THE REFERRAL. IF YOU HAVE NOT HEARD FROM US /SPECIALIST IN TWO WEEKS, PLEASE GIVE US  A CALL AT [phone].

## 2023-08-12 NOTE — Assessment & Plan Note (Signed)
Cholesterol levels are slightly elevated, continue statin. Tolerating well.

## 2023-08-12 NOTE — Assessment & Plan Note (Signed)
 She has done well and has lost 13 lbs since her last visit while taking phentermine  15mg  daily. Tolerating well. Continue exercising regularly.

## 2023-08-19 ENCOUNTER — Encounter: Payer: Self-pay | Admitting: Nurse Practitioner

## 2023-09-01 ENCOUNTER — Other Ambulatory Visit: Payer: Self-pay

## 2023-09-07 ENCOUNTER — Other Ambulatory Visit (HOSPITAL_COMMUNITY): Payer: Self-pay

## 2023-09-15 ENCOUNTER — Encounter: Payer: Self-pay | Admitting: Acute Care

## 2023-09-15 ENCOUNTER — Other Ambulatory Visit (HOSPITAL_COMMUNITY): Payer: Self-pay

## 2023-09-15 ENCOUNTER — Ambulatory Visit: Admitting: Acute Care

## 2023-09-15 VITALS — BP 112/78 | HR 68 | Temp 97.7°F | Ht 62.5 in | Wt 196.6 lb

## 2023-09-15 DIAGNOSIS — J449 Chronic obstructive pulmonary disease, unspecified: Secondary | ICD-10-CM

## 2023-09-15 DIAGNOSIS — Z87891 Personal history of nicotine dependence: Secondary | ICD-10-CM

## 2023-09-15 DIAGNOSIS — J42 Unspecified chronic bronchitis: Secondary | ICD-10-CM

## 2023-09-15 MED ORDER — BREZTRI AEROSPHERE 160-9-4.8 MCG/ACT IN AERO
2.0000 | INHALATION_SPRAY | Freq: Two times a day (BID) | RESPIRATORY_TRACT | 9 refills | Status: AC
  Filled 2023-09-15 – 2023-10-06 (×2): qty 10.7, 30d supply, fill #0
  Filled 2023-11-01: qty 10.7, 30d supply, fill #1
  Filled 2023-11-29: qty 10.7, 30d supply, fill #2
  Filled 2023-12-30: qty 10.7, 30d supply, fill #3
  Filled 2024-01-26: qty 10.7, 30d supply, fill #4
  Filled 2024-03-03: qty 32.1, 90d supply, fill #5

## 2023-09-15 NOTE — Patient Instructions (Addendum)
 It is good to see you today. We have scheduled your Lung Cancer Screening Scan. This is covered 100% by your insurance. You will get your results once the scan has been completed and read.  Continue your Breztri  as you have been doing.   Take 2 puffs in the morning and 2 puffs in the evening. Rinse mouth after use Continue albuterol  as needed for breakthrough shortness of breath or wheezing. Do not use this more than 3 times daily. If you need it more than that please call to be seen as it may mean you are developing an infection or flare Note your daily symptoms > remember red flags for COPD:  Increase in cough, increase in sputum production, increase in shortness of breath or activity intolerance. If you notice these symptoms, please call to be seen.    Follow-up in 1 year with Dr. Meade in a 30-minute slot as this will be to establish with her as Dr. Brenna is no longer with the practice. Please contact office for sooner follow up if symptoms do not improve or worsen or seek emergency care

## 2023-09-15 NOTE — Progress Notes (Signed)
 History of Present Illness Gabrielle Castro is a 68 y.o. female former smoker with COPD ,GERD  she is followed by the screening program, and she is a former [facility] patient.    09/15/2023 Discussed the use of AI scribe software for clinical note transcription with the patient, who gave verbal consent to proceed.  History of Present Illness Pt. Presents to discuss scheduling Lung Cancer Screening Scan and for annual COPD follow up as she had been seen previously by Dr. Brenna, and has not had her annual visit. She needs to establish with Dr. Meade in a 30 minute slot 09/2024.   We have discussed patient's frustration and issues with not being able to schedule her LDCT, as she works full time . I had one of the screening Nurse Navigators , Isaiah Dover RN, come down from the screening office  and schedule the scan for a time that works for the patient. We have also had [facility] , the [facility] reach out to [employer] to evaluate for coverage. She had it confirmed that scan is covered at 100% for the 71271 with the diagnosis code of Z87.891 as it is preventative. Deductible & OOP does not apply since it is preventative.  I shared this with the patient . Last LDCT was done 06/2022, and was read as a LR 2.  We did her annual COPD follow up. She has been doing well. No flares. Stable interval. She is compliant with her Breztri  twice daily. She is rinsing her mouth after use. She rarely uses her rescue. We reviewed flare signs and symptoms. I have told her to call to be seen sooner for flares. She verbalized understanding.    Test Results:  LDCT 06/2022 Mediastinum/Nodes: No pathologically enlarged mediastinal or hilar lymph nodes. Please note that accurate exclusion of hilar adenopathy is limited on noncontrast CT scans. Esophagus is unremarkable in appearance. No axillary lymphadenopathy.   Lungs/Pleura: Small pulmonary nodule in the medial aspect of the left lower lobe (axial image 135), with a volume derived  mean diameter of 3.3 mm. No other larger more suspicious appearing pulmonary nodules or masses are noted. No acute consolidative airspace disease. No pleural effusions. Mild diffuse bronchial wall thickening with severe centrilobular and paraseptal emphysema.  Lung-RADS 2, benign appearance or behavior. Continue annual screening with low-dose chest CT without contrast in 12 months. 2. Aortic atherosclerosis. 3. Mild diffuse bronchial wall thickening with severe centrilobular and paraseptal emphysema; imaging findings suggestive of underlying COPD.  Office-based spirometry and February 2018 which revealed a ratio of 70 and an FEV1 of 49% predicted consistent with severe obstructive defect.   She walks 10,000 steps every day.        Latest Ref Rng & Units 12/01/2022    9:47 AM 11/20/2021    9:37 AM 11/13/2020    9:28 AM  CBC  WBC 3.4 - 10.8 x10E3/uL 7.0  6.1  6.9   Hemoglobin 11.1 - 15.9 g/dL 84.7  85.5  85.8   Hematocrit 34.0 - 46.6 % 46.8  44.6  42.1   Platelets 150 - 450 x10E3/uL 250  222  221        Latest Ref Rng & Units 06/08/2023   10:29 AM 12/01/2022    9:47 AM 11/20/2021    9:37 AM  BMP  Glucose 70 - 99 mg/dL 89  93  92   BUN 8 - 27 mg/dL 29  23  21    Creatinine 0.57 - 1.00 mg/dL 9.01  8.92  8.92  BUN/Creat Ratio 12 - 28 30  21  20    Sodium 134 - 144 mmol/L 143  139  139   Potassium 3.5 - 5.2 mmol/L 4.8  4.5  4.6   Chloride 96 - 106 mmol/L 103  101  100   CO2 20 - 29 mmol/L 25  23  24    Calcium  8.7 - 10.3 mg/dL 89.7  9.6  9.9     BNP No results found for: BNP  ProBNP No results found for: PROBNP  PFT No results found for: FEV1PRE, FEV1POST, FVCPRE, FVCPOST, TLC, DLCOUNC, PREFEV1FVCRT, PSTFEV1FVCRT  No results found.   Past medical hx Past Medical History:  Diagnosis Date   Allergy    In my file   Anxiety    Who doesnt!!   COPD (chronic obstructive pulmonary disease) (HCC)    In my file   Dyspnea    EP (ectopic pregnancy)     GERD (gastroesophageal reflux disease)    Use tums   Sore throat 02/15/2019     Social History   Tobacco Use   Smoking status: Former    Current packs/day: 0.00    Average packs/day: 1 pack/day for 41.0 years (41.0 ttl pk-yrs)    Types: Cigarettes    Start date: 03/04/1975    Quit date: 03/03/2016    Years since quitting: 7.5   Smokeless tobacco: Never  Substance Use Topics   Alcohol use: Yes    Comment: Occasionally   Drug use: No    Ms.Mccroskey reports that she quit smoking about 7 years ago. Her smoking use included cigarettes. She started smoking about 48 years ago. She has a 41 pack-year smoking history. She has never used smokeless tobacco. She reports current alcohol use. She reports that she does not use drugs.  Tobacco Cessation: Former smoker , Quit 2018 with a 41 pack year smoking history.  Family history includes her father having lung cancer, he did not survive.   Past surgical hx, Family hx, Social hx all reviewed.  Current Outpatient Medications on File Prior to Visit  Medication Sig   albuterol  (VENTOLIN  HFA) 108 (90 Base) MCG/ACT inhaler Inhale 2 puffs into the lungs every 4 (four) hours as needed for cough, wheezing or shortness of breath   Ascorbic Acid (VITAMIN C) 1000 MG tablet Take 1,000 mg by mouth daily.   Biotin 89999 MCG TABS Take 2 tablets by mouth daily.   budesonide -glycopyrrolate -formoterol  (BREZTRI  AEROSPHERE) 160-9-4.8 MCG/ACT AERO inhaler Inhale 2 puffs into the lungs in the morning and at bedtime.   Calcium  600-200 MG-UNIT tablet Take 1 tablet by mouth daily.   Chlorphen-PE-Acetaminophen (NOREL AD) 4-10-325 MG TABS Take one tablet by mouth daily.   cholecalciferol (VITAMIN D3) 25 MCG (1000 UT) tablet Take 2,000 Units by mouth daily.   COLLAGEN PO Take by mouth daily.   Multiple Vitamin (MULTIVITAMIN ADULT PO) Take 100 mg by mouth 1 day or 1 dose. Neuriva   phentermine  15 MG capsule Take 1 capsule (15 mg total) by mouth every morning.    rosuvastatin  (CRESTOR ) 5 MG tablet Take 1 tablet (5 mg total) by mouth daily.   Lysine 1000 MG TABS Take 1 tablet by mouth daily. Pt stated that she is only taken 500mg . (Patient not taking: Reported on 09/15/2023)   No current facility-administered medications on file prior to visit.     Allergies  Allergen Reactions   Nickel Swelling, Hives and Itching   Tape Rash   Other Itching and Swelling  Review Of Systems:  Constitutional:   No  weight loss, night sweats,  Fevers, chills, fatigue, or  lassitude.  HEENT:   No headaches,  Difficulty swallowing,  Tooth/dental problems, or  Sore throat,                No sneezing, itching, ear ache, nasal congestion, post nasal drip,   CV:  No chest pain,  Orthopnea, PND, swelling in lower extremities, anasarca, dizziness, palpitations, syncope.   GI  No heartburn, indigestion, abdominal pain, nausea, vomiting, diarrhea, change in bowel habits, loss of appetite, bloody stools.   Resp: No shortness of breath with exertion or at rest.  No excess mucus, no productive cough,  No non-productive cough,  No coughing up of blood.  No change in color of mucus.  No wheezing.  No chest wall deformity  Skin: no rash or lesions.  GU: no dysuria, change in color of urine, no urgency or frequency.  No flank pain, no hematuria   MS:  No joint pain or swelling.  No decreased range of motion.  No back pain.  Psych:  No change in mood or affect. No depression or anxiety.  No memory loss.   Vital Signs BP 112/78   Pulse 68   Temp 97.7 F (36.5 C) (Oral)   Ht 5' 2.5 (1.588 m)   Wt 196 lb 9.6 oz (89.2 kg)   SpO2 95%   BMI 35.39 kg/m    Physical Exam:  General- No distress,  A&Ox3, pleasant ENT: No sinus tenderness, TM clear, pale nasal mucosa, no oral exudate,no post nasal drip, no [name] Cardiac: S1, S2, regular rate and rhythm, no murmur Chest: No wheeze/ rales/ dullness; no accessory muscle use, no nasal flaring, no sternal retractions, slightly  diminished per bases Abd.: Soft Non-tender, ND, BS +, Body mass index is 35.39 kg/m.  Ext: No clubbing cyanosis, edema, no obvious deformities Neuro:  normal strength, MAE x 4, A&O x 3 Skin: No rashes, warm and dry, no obvious skin lesions  Psych: normal mood and behavior  Assessment & Plan COPD Stable interval, no flares Former smoker  Lung Cancer Screening patient Plan We have scheduled your Lung Cancer Screening Scan. This is covered 100% by your insurance. You will get your results once the scan has been completed and read.  Continue your Breztri  as you have been doing.   Take 2 puffs in the morning and 2 puffs in the evening. Rinse mouth after use Continue albuterol  as needed for breakthrough shortness of breath or wheezing. Do not use this more than 3 times daily. If you need it more than that please call to be seen as it may mean you are developing an infection or flare Note your daily symptoms > remember red flags for COPD:  Increase in cough, increase in sputum production, increase in shortness of breath or activity intolerance. If you notice these symptoms, please call to be seen.    Follow-up in 1 year with Dr. Meade in a 30-minute slot as this will be to establish with her as Dr. Brenna is no longer with the practice. Please contact office for sooner follow up if symptoms do not improve or worsen or seek emergency care     I spent 30 minutes dedicated to the care of this patient on the date of this encounter to include pre-visit review of records, face-to-face time with the patient discussing conditions above, post visit ordering of testing, clinical documentation with the electronic health record,  making appropriate referrals as documented, and communicating necessary information to the patient's healthcare team.      Lauraine JULIANNA Lites, NP 09/15/2023  11:32 AM

## 2023-09-20 ENCOUNTER — Ambulatory Visit
Admission: RE | Admit: 2023-09-20 | Discharge: 2023-09-20 | Disposition: A | Discharge status: Home / Self Care | Source: Ambulatory Visit | Attending: Acute Care | Admitting: Acute Care

## 2023-09-20 DIAGNOSIS — Z122 Encounter for screening for malignant neoplasm of respiratory organs: Secondary | ICD-10-CM

## 2023-09-20 DIAGNOSIS — Z87891 Personal history of nicotine dependence: Secondary | ICD-10-CM | POA: Diagnosis not present

## 2023-09-30 ENCOUNTER — Other Ambulatory Visit (HOSPITAL_COMMUNITY): Payer: Self-pay

## 2023-09-30 ENCOUNTER — Other Ambulatory Visit: Payer: Self-pay | Admitting: Nurse Practitioner

## 2023-09-30 ENCOUNTER — Other Ambulatory Visit: Payer: Self-pay

## 2023-10-03 ENCOUNTER — Other Ambulatory Visit: Payer: Self-pay | Admitting: Nurse Practitioner

## 2023-10-03 MED ORDER — PHENTERMINE HCL 15 MG PO CAPS
15.0000 mg | ORAL_CAPSULE | ORAL | 0 refills | Status: DC
  Filled 2023-10-03: qty 30, 30d supply, fill #0

## 2023-10-04 ENCOUNTER — Ambulatory Visit: Payer: Self-pay | Admitting: Nurse Practitioner

## 2023-10-04 ENCOUNTER — Other Ambulatory Visit (HOSPITAL_COMMUNITY): Payer: Self-pay

## 2023-10-04 ENCOUNTER — Encounter: Payer: Self-pay | Admitting: Nurse Practitioner

## 2023-10-04 ENCOUNTER — Other Ambulatory Visit: Payer: Self-pay

## 2023-10-04 VITALS — BP 130/80 | HR 68 | Temp 97.8°F | Ht 62.0 in | Wt 192.0 lb

## 2023-10-04 DIAGNOSIS — E66812 Obesity, class 2: Secondary | ICD-10-CM | POA: Diagnosis not present

## 2023-10-04 DIAGNOSIS — E6609 Other obesity due to excess calories: Secondary | ICD-10-CM

## 2023-10-04 DIAGNOSIS — Z6835 Body mass index (BMI) 35.0-35.9, adult: Secondary | ICD-10-CM

## 2023-10-04 MED ORDER — PHENTERMINE HCL 37.5 MG PO CAPS
37.5000 mg | ORAL_CAPSULE | ORAL | 1 refills | Status: DC
  Filled 2023-10-04: qty 30, 30d supply, fill #0
  Filled 2023-10-28 – 2023-11-02 (×2): qty 30, 30d supply, fill #1

## 2023-10-04 NOTE — Progress Notes (Unsigned)
 LILLETTE Kristeen JINNY Gladis, CMA,acting as a Neurosurgeon for [name], FNP.,have documented all relevant documentation on the behalf of [name], FNP,as directed by  [name], FNP while in the presence of [name], FNP.  Subjective:  Patient ID: Gabrielle Castro , female    DOB: 05-28-1955 , 68 y.o.   MRN: [id number]  Chief Complaint  Patient presents with   Obesity    Patient presents today for a weight follow up, Patient reports compliance with medication. Patient denies any chest pain, SOB, or headaches. Patient has no concerns today.     HPI  Here for weight check. She is walking for her exercise. She is up at 3am and in bed at 8p. She is taking phentermine  15 mg daily.   She had her low dose CT scan.      Past Medical History:  Diagnosis Date   Allergy    In my file   Anxiety    Who doesnt!!   Body mass index (BMI) of 37.0 to 37.9 in adult 08/07/2016   Class 2 obesity due to excess calories with body mass index (BMI) of 35.0 to 35.9 in adult 10/04/2023   Class 2 obesity due to excess calories with body mass index (BMI) of 38.0 to 38.9 in adult 08/12/2023   Class 3 severe obesity due to excess calories without serious comorbidity with body mass index (BMI) of 40.0 to 44.9 in adult 12/08/2022   COPD (chronic obstructive pulmonary disease) (HCC)    In my file   Dyspnea    EP (ectopic pregnancy)    GERD (gastroesophageal reflux disease)    Use tums   Morbid obesity with BMI of 40.0-44.9, adult (HCC) 06/08/2023   Sore throat 02/15/2019     Family History  Problem Relation Age of Onset   Lung cancer Father        smoked   Cancer Father        lung; +TOB   Cancer Paternal Grandmother        every kind of cancer   Heart disease Mother    Diabetes Mother    Hypertension Mother    Hyperlipidemia Mother    Arthritis Mother    Obesity Mother    Cancer Brother    COPD Brother    Diabetes Sister    Obesity Sister      Current Outpatient Medications:    albuterol   (VENTOLIN  HFA) 108 (90 Base) MCG/ACT inhaler, Inhale 2 puffs into the lungs every 4 (four) hours as needed for cough, wheezing or shortness of breath, Disp: 6.7 g, Rfl: 5   Ascorbic Acid (VITAMIN C) 1000 MG tablet, Take 1,000 mg by mouth daily., Disp: , Rfl:    Biotin 89999 MCG TABS, Take 2 tablets by mouth daily., Disp: , Rfl:    budesonide -glycopyrrolate -formoterol  (BREZTRI  AEROSPHERE) 160-9-4.8 MCG/ACT AERO inhaler, Inhale 2 puffs into the lungs in the morning and at bedtime., Disp: 10.7 g, Rfl: 9   Calcium  600-200 MG-UNIT tablet, Take 1 tablet by mouth daily., Disp: , Rfl:    Chlorphen-PE-Acetaminophen (NOREL AD) 4-10-325 MG TABS, Take one tablet by mouth daily., Disp: 60 tablet, Rfl: 1   cholecalciferol (VITAMIN D3) 25 MCG (1000 UT) tablet, Take 2,000 Units by mouth daily., Disp: , Rfl:    COLLAGEN PO, Take by mouth daily., Disp: , Rfl:    Multiple Vitamin (MULTIVITAMIN ADULT PO), Take 100 mg by mouth 1 day or 1 dose. Neuriva, Disp: , Rfl:    rosuvastatin  (CRESTOR ) 5  MG tablet, Take 1 tablet (5 mg total) by mouth daily., Disp: 90 tablet, Rfl: 3   Lysine 1000 MG TABS, Take 1 tablet by mouth daily. Pt stated that she is only taken 500mg . (Patient not taking: Reported on 10/04/2023), Disp: , Rfl:    phentermine  37.5 MG capsule, Take 1 capsule (37.5 mg total) by mouth every morning., Disp: 30 capsule, Rfl: 1   Allergies  Allergen Reactions   Nickel Swelling, Hives and Itching   Tape Rash   Other Itching and Swelling     Review of Systems  Constitutional: Negative.   Respiratory: Negative.    Cardiovascular: Negative.   Musculoskeletal: Negative.   Skin: Negative.      Today's Vitals   10/04/23 1546  BP: 130/80  Pulse: 68  Temp: 97.8 F (36.6 C)  TempSrc: Oral  Weight: 192 lb (87.1 kg)  Height: 5' 2 (1.575 m)  PainSc: 0-No pain   Body mass index is 35.12 kg/m.  Wt Readings from Last 3 Encounters:  10/04/23 192 lb (87.1 kg)  09/15/23 196 lb 9.6 oz (89.2 kg)  08/12/23 198 lb  9.6 oz (90.1 kg)     Objective:  Physical Exam Vitals and nursing note reviewed.  Constitutional:      General: She is not in acute distress.    Appearance: Normal appearance. She is obese.  Cardiovascular:     Rate and Rhythm: Normal rate and regular rhythm.     Pulses: Normal pulses.     Heart sounds: Normal heart sounds. No murmur heard. Pulmonary:     Effort: Pulmonary effort is normal. No respiratory distress.     Breath sounds: Normal breath sounds. No wheezing.  Skin:    General: Skin is warm and dry.     Capillary Refill: Capillary refill takes less than 2 seconds.  Neurological:     General: No focal deficit present.     Mental Status: She is alert and oriented to person, place, and time.     Cranial Nerves: No cranial nerve deficit.     Motor: No weakness.  Psychiatric:        Mood and Affect: Mood normal.        Behavior: Behavior normal.        Thought Content: Thought content normal.        Judgment: Judgment normal.        Assessment And Plan:  Class 2 obesity due to excess calories with body mass index (BMI) of 35.0 to 35.9 in adult, unspecified whether serious comorbidity present -     Phentermine  HCl; Take 1 capsule (37.5 mg total) by mouth every morning.  Dispense: 30 capsule; Refill: 1    Return for keep same next change to physical.  Patient was given opportunity to ask questions. Patient verbalized understanding of the plan and was able to repeat key elements of the plan. All questions were answered to their satisfaction.    LILLETTE [name], FNP, have reviewed all documentation for this visit. The documentation on 10/04/23 for the exam, diagnosis, procedures, and orders are all accurate and complete.   IF YOU HAVE BEEN REFERRED TO A SPECIALIST, IT MAY TAKE 1-2 WEEKS TO SCHEDULE/PROCESS THE REFERRAL. IF YOU HAVE NOT HEARD FROM US /SPECIALIST IN TWO WEEKS, PLEASE GIVE US  A CALL AT [phone].

## 2023-10-05 ENCOUNTER — Other Ambulatory Visit: Payer: Self-pay

## 2023-10-05 DIAGNOSIS — Z87891 Personal history of nicotine dependence: Secondary | ICD-10-CM

## 2023-10-05 DIAGNOSIS — Z122 Encounter for screening for malignant neoplasm of respiratory organs: Secondary | ICD-10-CM

## 2023-10-06 ENCOUNTER — Other Ambulatory Visit: Payer: Self-pay

## 2023-10-12 ENCOUNTER — Telehealth: Payer: Self-pay

## 2023-10-12 NOTE — Telephone Encounter (Signed)
 Copied from CRM [id number]. Topic: General - Other >> Oct 08, 2023 11:25 AM Lavanda D wrote: Reason for CRM: Patient is calling in regards to her appointment last week and is requesting to speak with Lauraine Lites, asked patient what details she would like to discuss but patient declined to specify.

## 2023-10-26 ENCOUNTER — Telehealth: Payer: Self-pay | Admitting: *Deleted

## 2023-10-26 NOTE — Telephone Encounter (Signed)
 This is a duplicate message.  There is currently a message that was routed to SG on 10/12/23 to address patient concerns. Closing message

## 2023-10-29 ENCOUNTER — Other Ambulatory Visit (HOSPITAL_COMMUNITY): Payer: Self-pay

## 2023-10-29 ENCOUNTER — Other Ambulatory Visit: Payer: Self-pay

## 2023-11-01 ENCOUNTER — Other Ambulatory Visit: Payer: Self-pay

## 2023-11-26 ENCOUNTER — Other Ambulatory Visit (HOSPITAL_COMMUNITY): Payer: Self-pay

## 2023-11-29 ENCOUNTER — Other Ambulatory Visit: Payer: Self-pay | Admitting: Nurse Practitioner

## 2023-11-29 DIAGNOSIS — E6609 Other obesity due to excess calories: Secondary | ICD-10-CM

## 2023-11-30 ENCOUNTER — Other Ambulatory Visit (HOSPITAL_COMMUNITY): Payer: Self-pay

## 2023-11-30 ENCOUNTER — Other Ambulatory Visit: Payer: Self-pay

## 2023-11-30 MED ORDER — PHENTERMINE HCL 37.5 MG PO CAPS
37.5000 mg | ORAL_CAPSULE | ORAL | 1 refills | Status: DC
  Filled 2023-11-30: qty 30, 30d supply, fill #0

## 2023-12-09 ENCOUNTER — Encounter: Payer: Self-pay | Admitting: Nurse Practitioner

## 2023-12-09 ENCOUNTER — Other Ambulatory Visit (HOSPITAL_COMMUNITY): Payer: Self-pay

## 2023-12-09 ENCOUNTER — Ambulatory Visit: Payer: Self-pay | Admitting: Nurse Practitioner

## 2023-12-09 ENCOUNTER — Ambulatory Visit: Admitting: Nurse Practitioner

## 2023-12-09 VITALS — BP 130/80 | HR 78 | Temp 98.2°F | Ht 62.0 in | Wt 185.0 lb

## 2023-12-09 DIAGNOSIS — R7303 Prediabetes: Secondary | ICD-10-CM

## 2023-12-09 DIAGNOSIS — Z2821 Immunization not carried out because of patient refusal: Secondary | ICD-10-CM

## 2023-12-09 DIAGNOSIS — Z0000 Encounter for general adult medical examination without abnormal findings: Secondary | ICD-10-CM

## 2023-12-09 DIAGNOSIS — Z6833 Body mass index (BMI) 33.0-33.9, adult: Secondary | ICD-10-CM

## 2023-12-09 DIAGNOSIS — J449 Chronic obstructive pulmonary disease, unspecified: Secondary | ICD-10-CM

## 2023-12-09 DIAGNOSIS — Z1231 Encounter for screening mammogram for malignant neoplasm of breast: Secondary | ICD-10-CM

## 2023-12-09 DIAGNOSIS — E6609 Other obesity due to excess calories: Secondary | ICD-10-CM

## 2023-12-09 DIAGNOSIS — E782 Mixed hyperlipidemia: Secondary | ICD-10-CM

## 2023-12-09 DIAGNOSIS — S2231XA Fracture of one rib, right side, initial encounter for closed fracture: Secondary | ICD-10-CM

## 2023-12-09 DIAGNOSIS — Z79899 Other long term (current) drug therapy: Secondary | ICD-10-CM

## 2023-12-09 DIAGNOSIS — E66812 Obesity, class 2: Secondary | ICD-10-CM

## 2023-12-09 DIAGNOSIS — I700 Atherosclerosis of aorta: Secondary | ICD-10-CM

## 2023-12-09 DIAGNOSIS — E66811 Obesity, class 1: Secondary | ICD-10-CM | POA: Diagnosis not present

## 2023-12-09 DIAGNOSIS — E2839 Other primary ovarian failure: Secondary | ICD-10-CM | POA: Diagnosis not present

## 2023-12-09 MED ORDER — ROSUVASTATIN CALCIUM 5 MG PO TABS
5.0000 mg | ORAL_TABLET | Freq: Every day | ORAL | 1 refills | Status: AC
  Filled 2023-12-09 – 2024-03-03 (×2): qty 90, 90d supply, fill #0

## 2023-12-09 MED ORDER — PHENTERMINE HCL 37.5 MG PO CAPS
37.5000 mg | ORAL_CAPSULE | ORAL | 1 refills | Status: DC
  Filled 2023-12-09 – 2023-12-30 (×2): qty 30, 30d supply, fill #0
  Filled 2024-01-26 – 2024-02-02 (×3): qty 30, 30d supply, fill #1

## 2023-12-09 NOTE — Patient Instructions (Signed)

## 2023-12-09 NOTE — Progress Notes (Signed)
 I,[name], CMA,acting as a neurosurgeon for [employer], FNP.,have documented all relevant documentation on the behalf of Gabrielle Ada, FNP,as directed by  Gabrielle Ada, FNP while in the presence of Gabrielle Ada, FNP.  Subjective:    Patient ID: Gabrielle Castro , female    DOB: [birth year] , 68 y.o.   MRN: [id number]  Chief Complaint  Patient presents with   Annual Exam    Patient presents today for HM, Patient reports compliance with medication. Patient denies any chest pain, SOB, or headaches. Patient has no concerns today.     HPI  Here for HM.     Discussed the use of AI scribe software for clinical note transcription with the patient, who gave verbal consent to proceed.  History of Present Illness Gabrielle Castro is a 68 year old female who presents for an annual physical exam.  She engages in regular physical activity, walking daily and achieving at least ten thousand steps. She experiences shin splints in the anterior leg, which improve with rest. She also climbs stairs regularly and has noticed improved breathing and reduced knee pain with recent weight loss.  Her diet includes a salad for lunch on weekdays, with breakfast options such as a banana with peanut butter or yogurt. Dinner varies, and she plans to incorporate more protein, such as chicken. She has lost about seven pounds recently and aims to lose an additional forty-five pounds.  She has not seen any other doctors since her last visit and does not currently have a gynecologist. 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  She has not visited a dentist in forty years and expresses anxiety about dental visits. A recent lung scan showed some findings that will be reviewed. She has a family history of cancer, including her father with lung cancer, a brother with esophagus cancer, another brother with prostate  cancer, and a paternal grandmother who had breast cancer and other cancers.  No constipation or diarrhea, and regular bowel movements. She does not perform self-breast exams but gets annual mammograms. She has a history of aortic atherosclerosis and emphysema, with a recent lung scan showing a stable nodule and a remote rib fracture. She has not had a bone density test in over two years.   Past Medical History:  Diagnosis Date   Allergy    In my file   Anxiety    Who doesnt!!   Body mass index (BMI) of 37.0 to 37.9 in adult 08/07/2016   Class 2 obesity due to excess calories with body mass index (BMI) of 35.0 to 35.9 in adult 10/04/2023   Class 2 obesity due to excess calories with body mass index (BMI) of 38.0 to 38.9 in adult 08/12/2023   Class 3 severe obesity due to excess calories without serious comorbidity with body mass index (BMI) of 40.0 to 44.9 in adult [facility]) 12/08/2022   COPD (chronic obstructive pulmonary disease) (HCC)    In my file   Dyspnea    EP (ectopic pregnancy)    GERD (gastroesophageal reflux disease)    Use tums   Morbid obesity with BMI of 40.0-44.9, adult (HCC) 06/08/2023   Sore throat 02/15/2019     Family History  Problem Relation Age of Onset   Lung cancer Father        smoked   Cancer Father        lung; +TOB  Cancer Paternal Grandmother        every kind of cancer   Heart disease Mother    Diabetes Mother    Hypertension Mother    Hyperlipidemia Mother    Arthritis Mother    Obesity Mother    Cancer Brother    COPD Brother    Diabetes Brother    Diabetes Sister    Obesity Sister    Cancer Brother    Diabetes Brother      Current Outpatient Medications:    albuterol  (VENTOLIN  HFA) 108 (90 Base) MCG/ACT inhaler, Inhale 2 puffs into the lungs every 4 (four) hours as needed for cough, wheezing or shortness of breath, Disp: 6.7 g, Rfl: 5   Ascorbic Acid (VITAMIN C) 1000 MG tablet, Take 1,000 mg by mouth daily., Disp: , Rfl:    Biotin  89999 MCG TABS, Take 2 tablets by mouth daily., Disp: , Rfl:    budesonide -glycopyrrolate -formoterol  (BREZTRI  AEROSPHERE) 160-9-4.8 MCG/ACT AERO inhaler, Inhale 2 puffs into the lungs in the morning and at bedtime., Disp: 10.7 g, Rfl: 9   Calcium  600-200 MG-UNIT tablet, Take 1 tablet by mouth daily., Disp: , Rfl:    Chlorphen-PE-Acetaminophen (NOREL AD) 4-10-325 MG TABS, Take one tablet by mouth daily., Disp: 60 tablet, Rfl: 1   cholecalciferol (VITAMIN D3) 25 MCG (1000 UT) tablet, Take 2,000 Units by mouth daily., Disp: , Rfl:    COLLAGEN PO, Take by mouth daily., Disp: , Rfl:    Multiple Vitamin (MULTIVITAMIN ADULT PO), Take 100 mg by mouth 1 day or 1 dose. Neuriva, Disp: , Rfl:    Lysine 1000 MG TABS, Take 1 tablet by mouth daily. Pt stated that she is only taken 500mg . (Patient not taking: Reported on 12/09/2023), Disp: , Rfl:    phentermine  37.5 MG capsule, Take 1 capsule (37.5 mg total) by mouth every morning., Disp: 30 capsule, Rfl: 1   rosuvastatin  (CRESTOR ) 5 MG tablet, Take 1 tablet (5 mg total) by mouth daily., Disp: 90 tablet, Rfl: 1   Allergies  Allergen Reactions   Nickel Swelling, Hives and Itching   Tape Rash   Other Itching and Swelling      The patient states she uses post menopausal status for birth control. No LMP recorded. Patient is postmenopausal.   Negative for: breast discharge, breast lump(s), breast pain and breast self exam. Associated symptoms include abnormal vaginal bleeding. Pertinent negatives include abnormal bleeding (hematology), anxiety, decreased libido, depression, difficulty falling sleep, dyspareunia, history of infertility, nocturia, sexual dysfunction, sleep disturbances, urinary incontinence, urinary urgency, vaginal discharge and vaginal itching. Diet regular; salad for lunch, banana/peanut butter, dinner - sometimes will eat and sometimes will not. The patient states her exercise level is moderate with walking every day at least 10,000 steps.   The  patient's tobacco use is:  Social History   Tobacco Use  Smoking Status Former   Current packs/day: 0.00   Average packs/day: 1 pack/day for 41.0 years (41.0 ttl pk-yrs)   Types: Cigarettes   Start date: 03/04/1975   Quit date: 03/03/2016   Years since quitting: 7.8  Smokeless Tobacco Never   She has been exposed to passive smoke. The patient's alcohol use is:  Social History   Substance and Sexual Activity  Alcohol Use Yes   Comment: Occasionally    Review of Systems  Constitutional: Negative.   HENT: Negative.    Eyes: Negative.   Respiratory: Negative.    Cardiovascular: Negative.   Gastrointestinal: Negative.   Endocrine: Negative.  Genitourinary: Negative.   Musculoskeletal: Negative.   Skin: Negative.   Allergic/Immunologic: Negative.   Neurological: Negative.   Hematological: Negative.   Psychiatric/Behavioral: Negative.       Today's Vitals   12/09/23 0821  BP: 130/80  Pulse: 78  Temp: 98.2 F (36.8 C)  TempSrc: Oral  Weight: 185 lb (83.9 kg)  Height: 5' 2 (1.575 m)  PainSc: 0-No pain   Body mass index is 33.84 kg/m.  Wt Readings from Last 3 Encounters:  12/09/23 185 lb (83.9 kg)  10/04/23 192 lb (87.1 kg)  09/15/23 196 lb 9.6 oz (89.2 kg)     Objective:  Physical Exam Vitals and nursing note reviewed.  Constitutional:      General: She is not in acute distress.    Appearance: Normal appearance. She is well-developed. She is obese.  HENT:     Head: Normocephalic and atraumatic.     Right Ear: Hearing, tympanic membrane, ear canal and external ear normal. There is no impacted cerumen.     Left Ear: Hearing, tympanic membrane, ear canal and external ear normal. There is no impacted cerumen.     Nose: Nose normal.     Mouth/Throat:     Mouth: Mucous membranes are moist.  Eyes:     General: Lids are normal.     Extraocular Movements: Extraocular movements intact.     Conjunctiva/sclera: Conjunctivae normal.     Pupils: Pupils are equal,  round, and reactive to light.     Funduscopic exam:    Right eye: No papilledema.        Left eye: No papilledema.  Neck:     Thyroid : No thyroid  mass.     Vascular: No carotid bruit.  Cardiovascular:     Rate and Rhythm: Normal rate and regular rhythm.     Pulses: Normal pulses.     Heart sounds: Normal heart sounds. No murmur heard. Pulmonary:     Effort: Pulmonary effort is normal. No respiratory distress.     Breath sounds: Normal breath sounds. No wheezing.  Chest:     Chest wall: No mass.  Breasts:    Tanner Score is 5.     Right: Normal. No mass or tenderness.     Left: Normal. No mass or tenderness.  Abdominal:     General: Abdomen is flat. Bowel sounds are normal. There is no distension.     Palpations: Abdomen is soft.     Tenderness: There is no abdominal tenderness.  Musculoskeletal:        General: No swelling or tenderness. Normal range of motion.     Cervical back: Full passive range of motion without pain, normal range of motion and neck supple.     Right lower leg: No edema.     Left lower leg: No edema.  Lymphadenopathy:     Upper Body:     Right upper body: No supraclavicular, axillary or pectoral adenopathy.     Left upper body: No supraclavicular, axillary or pectoral adenopathy.  Skin:    General: Skin is warm and dry.     Capillary Refill: Capillary refill takes less than 2 seconds.  Neurological:     General: No focal deficit present.     Mental Status: She is alert and oriented to person, place, and time.     Cranial Nerves: No cranial nerve deficit.     Sensory: No sensory deficit.     Motor: No weakness.  Psychiatric:  Mood and Affect: Mood normal.        Behavior: Behavior normal.        Thought Content: Thought content normal.        Judgment: Judgment normal.    Assessment And Plan:     Encounter for annual health examination Assessment & Plan: Routine physical examination with emphasis on exercise and diet. - Continue current  exercise and diet regimen. - Schedule mammogram for end of the year. - Encourage monthly self-breast exams.  Has not visited a dentist in forty years. Discussed importance of dental health and its connection to heart disease. - Encourage regular dental visits for cleaning and assessment.   Pre-diabetes -     CMP14+EGFR  Mixed hyperlipidemia -     CMP14+EGFR -     Lipid panel -     Rosuvastatin  Calcium ; Take 1 tablet (5 mg total) by mouth daily.  Dispense: 90 tablet; Refill: 1  Atherosclerosis of aorta  Influenza vaccination declined  Class 1 obesity due to excess calories without serious comorbidity with body mass index (BMI) of 33.0 to 33.9 in adult Assessment & Plan: Class 2 obesity with recent weight loss of seven pounds. Higher dose of phentermine  aiding in weight loss. Discussed protein intake and strength training to maintain muscle tone. - Continue phentermine  as prescribed. - Encourage protein intake and strength training exercises.  Orders: -     Phentermine  HCl; Take 1 capsule (37.5 mg total) by mouth every morning.  Dispense: 30 capsule; Refill: 1  Other long term (current) drug therapy -     CBC  Encounter for screening mammogram for breast cancer -     3D Screening Mammogram, Left and Right; Future  Decreased estrogen level -     DG Bone Density; Future  Closed fracture of one rib of right side, initial encounter Assessment & Plan: Remote rib fracture noted on lung scan with no history of fall or trauma. Suspected osteoporosis due to spontaneous fracture. Last bone density test over two years ago. - Order bone density test. - Discuss cost of bone density test with her when contacted.  Orders: -     DG Bone Density; Future  COPD GOLD II/III  Assessment & Plan: Moderate emphysema with improved breathing due to weight loss and exercise. - Continue current inhaler regimen.     Return for 1 year physical, 2 months weight check. Patient was given  opportunity to ask questions. Patient verbalized understanding of the plan and was able to repeat key elements of the plan. All questions were answered to their satisfaction.   Gabrielle Ada, FNP  I, Gabrielle Ada, FNP, have reviewed all documentation for this visit. The documentation on 12/09/23 for the exam, diagnosis, procedures, and orders are all accurate and complete.

## 2023-12-10 LAB — CBC
Hematocrit: 45.3 % (ref 34.0–46.6)
Hemoglobin: 14.6 g/dL (ref 11.1–15.9)
MCH: 30.9 pg (ref 26.6–33.0)
MCHC: 32.2 g/dL (ref 31.5–35.7)
MCV: 96 fL (ref 79–97)
Platelets: 233 x10E3/uL (ref 150–450)
RBC: 4.73 x10E6/uL (ref 3.77–5.28)
RDW: 13 % (ref 11.7–15.4)
WBC: 6.4 x10E3/uL (ref 3.4–10.8)

## 2023-12-10 LAB — CMP14+EGFR
ALT: 15 IU/L (ref 0–32)
AST: 21 IU/L (ref 0–40)
Albumin: 4.2 g/dL (ref 3.9–4.9)
Alkaline Phosphatase: 66 IU/L (ref 49–135)
BUN/Creatinine Ratio: 21 (ref 12–28)
BUN: 23 mg/dL (ref 8–27)
Bilirubin Total: 0.4 mg/dL (ref 0.0–1.2)
CO2: 25 mmol/L (ref 20–29)
Calcium: 9.6 mg/dL (ref 8.7–10.3)
Chloride: 100 mmol/L (ref 96–106)
Creatinine, Ser: 1.12 mg/dL — ABNORMAL HIGH (ref 0.57–1.00)
Globulin, Total: 2.3 g/dL (ref 1.5–4.5)
Glucose: 81 mg/dL (ref 70–99)
Potassium: 4.8 mmol/L (ref 3.5–5.2)
Sodium: 139 mmol/L (ref 134–144)
Total Protein: 6.5 g/dL (ref 6.0–8.5)
eGFR: 54 mL/min/1.73 — ABNORMAL LOW (ref >59)

## 2023-12-10 LAB — LIPID PANEL
Chol/HDL Ratio: 2.1 ratio (ref 0.0–4.4)
Cholesterol, Total: 188 mg/dL (ref 100–199)
HDL: 88 mg/dL (ref >39)
LDL Chol Calc (NIH): 86 mg/dL (ref 0–99)
Triglycerides: 78 mg/dL (ref 0–149)
VLDL Cholesterol Cal: 14 mg/dL (ref 5–40)

## 2023-12-19 ENCOUNTER — Ambulatory Visit: Payer: Self-pay | Admitting: Nurse Practitioner

## 2023-12-19 ENCOUNTER — Encounter: Payer: Self-pay | Admitting: Nurse Practitioner

## 2023-12-19 DIAGNOSIS — E66811 Other obesity due to excess calories: Secondary | ICD-10-CM | POA: Insufficient documentation

## 2023-12-19 DIAGNOSIS — S2231XA Fracture of one rib, right side, initial encounter for closed fracture: Secondary | ICD-10-CM | POA: Insufficient documentation

## 2023-12-19 NOTE — Assessment & Plan Note (Signed)
 Routine physical examination with emphasis on exercise and diet. - Continue current exercise and diet regimen. - Schedule mammogram for end of the year. - Encourage monthly self-breast exams.  Has not visited a dentist in forty years. Discussed importance of dental health and its connection to heart disease. - Encourage regular dental visits for cleaning and assessment.

## 2023-12-19 NOTE — Assessment & Plan Note (Signed)
 Remote rib fracture noted on lung scan with no history of fall or trauma. Suspected osteoporosis due to spontaneous fracture. Last bone density test over two years ago. - Order bone density test. - Discuss cost of bone density test with her when contacted.

## 2023-12-19 NOTE — Assessment & Plan Note (Signed)
 Class 2 obesity with recent weight loss of seven pounds. Higher dose of phentermine  aiding in weight loss. Discussed protein intake and strength training to maintain muscle tone. - Continue phentermine  as prescribed. - Encourage protein intake and strength training exercises.

## 2023-12-19 NOTE — Assessment & Plan Note (Signed)
 Moderate emphysema with improved breathing due to weight loss and exercise. - Continue current inhaler regimen.

## 2023-12-29 ENCOUNTER — Ambulatory Visit
Admission: RE | Admit: 2023-12-29 | Discharge: 2023-12-29 | Disposition: A | Discharge status: Home / Self Care | Source: Ambulatory Visit | Attending: Nurse Practitioner | Admitting: Nurse Practitioner

## 2023-12-29 DIAGNOSIS — Z1231 Encounter for screening mammogram for malignant neoplasm of breast: Secondary | ICD-10-CM

## 2023-12-30 ENCOUNTER — Other Ambulatory Visit: Payer: Self-pay

## 2023-12-30 ENCOUNTER — Other Ambulatory Visit (HOSPITAL_COMMUNITY): Payer: Self-pay

## 2024-01-26 ENCOUNTER — Other Ambulatory Visit: Payer: Self-pay

## 2024-01-26 ENCOUNTER — Other Ambulatory Visit (HOSPITAL_COMMUNITY): Payer: Self-pay

## 2024-01-26 ENCOUNTER — Other Ambulatory Visit: Payer: Self-pay | Admitting: Nurse Practitioner

## 2024-01-26 ENCOUNTER — Inpatient Hospital Stay
Admission: RE | Admit: 2024-01-26 | Discharge: 2024-01-26 | Attending: Nurse Practitioner | Admitting: Nurse Practitioner

## 2024-01-26 DIAGNOSIS — Z1231 Encounter for screening mammogram for malignant neoplasm of breast: Secondary | ICD-10-CM | POA: Diagnosis not present

## 2024-01-26 DIAGNOSIS — J449 Chronic obstructive pulmonary disease, unspecified: Secondary | ICD-10-CM

## 2024-01-27 ENCOUNTER — Other Ambulatory Visit: Payer: Self-pay

## 2024-01-27 MED ORDER — ALBUTEROL SULFATE HFA 108 (90 BASE) MCG/ACT IN AERS
2.0000 | INHALATION_SPRAY | RESPIRATORY_TRACT | 5 refills | Status: AC | PRN
  Filled 2024-01-27: qty 6.7, 16d supply, fill #0
  Filled 2024-02-18: qty 6.7, 16d supply, fill #1

## 2024-01-31 ENCOUNTER — Other Ambulatory Visit (HOSPITAL_COMMUNITY): Payer: Self-pay

## 2024-02-02 ENCOUNTER — Other Ambulatory Visit (HOSPITAL_COMMUNITY): Payer: Self-pay

## 2024-02-16 ENCOUNTER — Other Ambulatory Visit (HOSPITAL_COMMUNITY): Payer: Self-pay

## 2024-02-18 ENCOUNTER — Other Ambulatory Visit: Payer: Self-pay

## 2024-02-21 ENCOUNTER — Encounter: Payer: Self-pay | Admitting: Nurse Practitioner

## 2024-02-21 ENCOUNTER — Other Ambulatory Visit: Payer: Self-pay

## 2024-02-21 ENCOUNTER — Ambulatory Visit: Payer: Self-pay | Admitting: Nurse Practitioner

## 2024-02-21 VITALS — BP 130/72 | HR 75 | Temp 98.1°F | Ht 62.0 in | Wt 188.6 lb

## 2024-02-21 DIAGNOSIS — S2231XD Fracture of one rib, right side, subsequent encounter for fracture with routine healing: Secondary | ICD-10-CM

## 2024-02-21 DIAGNOSIS — E6609 Other obesity due to excess calories: Secondary | ICD-10-CM | POA: Diagnosis not present

## 2024-02-21 DIAGNOSIS — Z6834 Body mass index (BMI) 34.0-34.9, adult: Secondary | ICD-10-CM

## 2024-02-21 DIAGNOSIS — E66811 Obesity, class 1: Secondary | ICD-10-CM | POA: Diagnosis not present

## 2024-02-21 MED ORDER — PHENTERMINE HCL 37.5 MG PO CAPS
37.5000 mg | ORAL_CAPSULE | ORAL | 1 refills | Status: AC
  Filled 2024-02-21: qty 30, 30d supply, fill #0
  Filled 2024-03-03: qty 60, 60d supply, fill #0

## 2024-02-21 NOTE — Assessment & Plan Note (Signed)
 Fracture found during lung cancer screening. No symptoms. Insurance coverage for bone density scan unclear. - Follow up on insurance coverage for bone density scan. She had her last bone density in 2024

## 2024-02-21 NOTE — Progress Notes (Signed)
 LILLETTE Kristeen JINNY Gladis, CMA,acting as a neurosurgeon for Gabrielle Ada, FNP.,have documented all relevant documentation on the behalf of Gabrielle Ada, FNP,as directed by  Gabrielle Ada, FNP while in the presence of Gabrielle Ada, FNP.  Subjective:  Patient ID: Gabrielle Castro , female    DOB: 1956/01/11 , 69 y.o.   MRN: [id number]  Chief Complaint  Patient presents with   Obesity    Patient presents today for a weight follow up, Patient reports compliance with medication. Patient denies any chest pain, SOB, or headaches. Patient has no concerns today.     HPI  Discussed the use of AI scribe software for clinical note transcription with the patient, who gave verbal consent to proceed.  History of Present Illness Gabrielle Castro is a 69 year old female who presents for a weight check.  She has gained approximately three pounds, attributing this to her having to work overtime. She is currently taking phentermine  for weight management.  She ask about her insurance coverage and the cost of weight loss medications.   She engages in physical activity, specifically walking, although she is not walking as much due to working overtime.  Her family history includes an aunt who may have had pancreatic cancer, a father who had lung cancer, and a mother who did not have any cancer.  She recalls a previous lung cancer screening that revealed a fractured rib and is uncertain about the coverage for a bone density scan due to this finding.  Past Medical History:  Diagnosis Date   Allergy    In my file   Anxiety    Who doesnt!!   Body mass index (BMI) of 37.0 to 37.9 in adult 08/07/2016   Class 2 obesity due to excess calories with body mass index (BMI) of 35.0 to 35.9 in adult 10/04/2023   Class 2 obesity due to excess calories with body mass index (BMI) of 38.0 to 38.9 in adult 08/12/2023   Class 3 severe obesity due to excess calories without serious comorbidity with body mass index (BMI) of 40.0 to 44.9 in adult  [facility]) 12/08/2022   COPD (chronic obstructive pulmonary disease) (HCC)    In my file   Dyspnea    EP (ectopic pregnancy)    GERD (gastroesophageal reflux disease)    Use tums   Morbid obesity with BMI of 40.0-44.9, adult (HCC) 06/08/2023   Sore throat 02/15/2019     Family History  Problem Relation Age of Onset   Heart disease Mother    Diabetes Mother    Hypertension Mother    Hyperlipidemia Mother    Arthritis Mother    Obesity Mother    Lung cancer Father        smoked   Cancer Father        lung; +TOB   Diabetes Sister    Obesity Sister    Breast cancer Paternal Grandmother    Cancer Paternal Grandmother        every kind of cancer   Cancer Brother    COPD Brother    Diabetes Brother    Cancer Brother    Diabetes Brother     Current Medications[1]   Allergies[2]   Review of Systems  Constitutional: Negative.   Respiratory: Negative.    Cardiovascular: Negative.   Musculoskeletal: Negative.   Skin: Negative.   Neurological: Negative.   Psychiatric/Behavioral: Negative.       Today's Vitals   02/21/24 0924  BP: 130/72  Pulse: 75  Temp: 98.1  F (36.7 C)  TempSrc: Oral  Weight: 188 lb 9.6 oz (85.5 kg)  Height: 5' 2 (1.575 m)  PainSc: 0-No pain   Body mass index is 34.5 kg/m.  Wt Readings from Last 3 Encounters:  02/21/24 188 lb 9.6 oz (85.5 kg)  12/09/23 185 lb (83.9 kg)  10/04/23 192 lb (87.1 kg)     Objective:  Physical Exam Vitals and nursing note reviewed.  Constitutional:      General: She is not in acute distress.    Appearance: Normal appearance. She is obese.  Cardiovascular:     Rate and Rhythm: Normal rate and regular rhythm.     Pulses: Normal pulses.     Heart sounds: Normal heart sounds. No murmur heard. Pulmonary:     Effort: Pulmonary effort is normal. No respiratory distress.     Breath sounds: Normal breath sounds. No wheezing.  Skin:    General: Skin is warm and dry.     Capillary Refill: Capillary refill takes  less than 2 seconds.  Neurological:     General: No focal deficit present.     Mental Status: She is alert and oriented to person, place, and time.     Cranial Nerves: No cranial nerve deficit.     Motor: No weakness.  Psychiatric:        Mood and Affect: Mood normal.        Behavior: Behavior normal.        Thought Content: Thought content normal.        Judgment: Judgment normal.      Assessment And Plan:   Assessment & Plan Class 1 obesity due to excess calories with body mass index (BMI) of 34.0 to 34.9 in adult, unspecified whether serious comorbidity present Discussed transitioning to oral Wegovy or Qsymia. Considered cost and side effects. Emphasized diet and exercise. Considered family history of cancer for medication choice. - Refilled phentermine , at next visit will need to transition to another weight loss medication - Provided information on oral Wegovy and Qsymia. - Encouraged focused diet since not being able to exercise as often. Closed fracture of one rib of right side with routine healing, subsequent encounter Fracture found during lung cancer screening. No symptoms. Insurance coverage for bone density scan unclear. - Follow up on insurance coverage for bone density scan. She had her last bone density in 2024  No orders of the defined types were placed in this encounter.    Return for 2 months weight check.  Patient was given opportunity to ask questions. Patient verbalized understanding of the plan and was able to repeat key elements of the plan. All questions were answered to their satisfaction.    LILLETTE Gabrielle Ada, FNP, have reviewed all documentation for this visit. The documentation on 02/21/2024 for the exam, diagnosis, procedures, and orders are all accurate and complete.   IF YOU HAVE BEEN REFERRED TO A SPECIALIST, IT MAY TAKE 1-2 WEEKS TO SCHEDULE/PROCESS THE REFERRAL. IF YOU HAVE NOT HEARD FROM US /SPECIALIST IN TWO WEEKS, PLEASE GIVE US  A CALL AT  [phone].      [1]  Current Outpatient Medications:    albuterol  (VENTOLIN  HFA) 108 (90 Base) MCG/ACT inhaler, Inhale 2 puffs into the lungs every 4 (four) hours as needed for cough, wheezing or shortness of breath, Disp: 6.7 g, Rfl: 5   Ascorbic Acid (VITAMIN C) 1000 MG tablet, Take 1,000 mg by mouth daily., Disp: , Rfl:    Biotin 89999 MCG TABS, Take  2 tablets by mouth daily., Disp: , Rfl:    budesonide -glycopyrrolate -formoterol  (BREZTRI  AEROSPHERE) 160-9-4.8 MCG/ACT AERO inhaler, Inhale 2 puffs into the lungs in the morning and at bedtime., Disp: 10.7 g, Rfl: 9   Calcium  600-200 MG-UNIT tablet, Take 1 tablet by mouth daily., Disp: , Rfl:    Chlorphen-PE-Acetaminophen (NOREL AD) 4-10-325 MG TABS, Take one tablet by mouth daily., Disp: 60 tablet, Rfl: 1   cholecalciferol (VITAMIN D3) 25 MCG (1000 UT) tablet, Take 2,000 Units by mouth daily., Disp: , Rfl:    COLLAGEN PO, Take by mouth daily., Disp: , Rfl:    Multiple Vitamin (MULTIVITAMIN ADULT PO), Take 100 mg by mouth 1 day or 1 dose. Neuriva, Disp: , Rfl:    phentermine  37.5 MG capsule, Take 1 capsule (37.5 mg total) by mouth every morning., Disp: 30 capsule, Rfl: 1   rosuvastatin  (CRESTOR ) 5 MG tablet, Take 1 tablet (5 mg total) by mouth daily., Disp: 90 tablet, Rfl: 1   Lysine 1000 MG TABS, Take 1 tablet by mouth daily. Pt stated that she is only taken 500mg . (Patient not taking: Reported on 02/21/2024), Disp: , Rfl:  [2]  Allergies Allergen Reactions   Nickel Swelling, Hives and Itching   Tape Rash   Other Itching and Swelling

## 2024-02-22 ENCOUNTER — Other Ambulatory Visit (HOSPITAL_COMMUNITY): Payer: Self-pay

## 2024-03-03 ENCOUNTER — Other Ambulatory Visit: Payer: Self-pay

## 2024-04-20 ENCOUNTER — Ambulatory Visit: Payer: Self-pay | Admitting: Nurse Practitioner

## 2024-12-12 ENCOUNTER — Encounter: Payer: Self-pay | Admitting: Nurse Practitioner
